# Patient Record
Sex: Male | Born: 1960 | Race: Black or African American | Hispanic: No | Marital: Married | Smoking: Never smoker
Health system: Southern US, Community
[De-identification: ages and names within clinical notes are randomized; demographics above are authoritative.]

## PROBLEM LIST (undated history)

## (undated) DIAGNOSIS — J302 Other seasonal allergic rhinitis: Secondary | ICD-10-CM

## (undated) DIAGNOSIS — J45909 Unspecified asthma, uncomplicated: Secondary | ICD-10-CM

## (undated) DIAGNOSIS — M2550 Pain in unspecified joint: Secondary | ICD-10-CM

## (undated) DIAGNOSIS — K5900 Constipation, unspecified: Secondary | ICD-10-CM

## (undated) DIAGNOSIS — E785 Hyperlipidemia, unspecified: Secondary | ICD-10-CM

## (undated) DIAGNOSIS — M549 Dorsalgia, unspecified: Secondary | ICD-10-CM

## (undated) DIAGNOSIS — E559 Vitamin D deficiency, unspecified: Secondary | ICD-10-CM

## (undated) DIAGNOSIS — I10 Essential (primary) hypertension: Secondary | ICD-10-CM

## (undated) HISTORY — DX: Vitamin D deficiency, unspecified: E55.9

## (undated) HISTORY — DX: Other seasonal allergic rhinitis: J30.2

## (undated) HISTORY — DX: Constipation, unspecified: K59.00

## (undated) HISTORY — DX: Dorsalgia, unspecified: M54.9

## (undated) HISTORY — PX: INGUINAL HERNIA REPAIR: SUR1180

## (undated) HISTORY — DX: Unspecified asthma, uncomplicated: J45.909

## (undated) HISTORY — PX: PILONIDAL CYST EXCISION: SHX744

## (undated) HISTORY — DX: Essential (primary) hypertension: I10

## (undated) HISTORY — PX: WISDOM TOOTH EXTRACTION: SHX21

## (undated) HISTORY — DX: Pain in unspecified joint: M25.50

---

## 1998-12-28 ENCOUNTER — Encounter: Payer: Self-pay | Admitting: Emergency Medicine

## 1998-12-28 ENCOUNTER — Emergency Department (HOSPITAL_COMMUNITY): Admission: EM | Admit: 1998-12-28 | Discharge: 1998-12-28 | Payer: Self-pay | Admitting: Internal Medicine

## 1999-05-19 ENCOUNTER — Emergency Department (HOSPITAL_COMMUNITY): Admission: EM | Admit: 1999-05-19 | Discharge: 1999-05-19 | Payer: Self-pay | Admitting: Emergency Medicine

## 1999-05-19 ENCOUNTER — Encounter: Payer: Self-pay | Admitting: Emergency Medicine

## 2001-10-25 ENCOUNTER — Emergency Department (HOSPITAL_COMMUNITY): Admission: EM | Admit: 2001-10-25 | Discharge: 2001-10-25 | Payer: Self-pay | Admitting: Emergency Medicine

## 2004-08-02 ENCOUNTER — Emergency Department (HOSPITAL_COMMUNITY): Admission: EM | Admit: 2004-08-02 | Discharge: 2004-08-02 | Payer: Self-pay | Admitting: Emergency Medicine

## 2004-08-12 ENCOUNTER — Encounter: Admission: RE | Admit: 2004-08-12 | Discharge: 2004-10-01 | Payer: Self-pay | Admitting: Internal Medicine

## 2004-10-27 ENCOUNTER — Ambulatory Visit: Payer: Self-pay | Admitting: Internal Medicine

## 2008-03-31 ENCOUNTER — Ambulatory Visit: Payer: Self-pay | Admitting: Internal Medicine

## 2009-04-02 LAB — CONVERTED CEMR LAB: PSA: 0.28 ng/mL

## 2009-05-18 ENCOUNTER — Ambulatory Visit: Payer: Self-pay | Admitting: Internal Medicine

## 2009-05-22 LAB — CONVERTED CEMR LAB
ALT: 24 units/L (ref 0–53)
AST: 26 units/L (ref 0–37)
Albumin: 3.7 g/dL (ref 3.5–5.2)
Alkaline Phosphatase: 50 units/L (ref 39–117)
BUN: 9 mg/dL (ref 6–23)
Basophils Absolute: 0 10*3/uL (ref 0.0–0.1)
Basophils Relative: 0.2 % (ref 0.0–3.0)
Bilirubin, Direct: 0.1 mg/dL (ref 0.0–0.3)
CO2: 29 meq/L (ref 19–32)
Calcium: 8.7 mg/dL (ref 8.4–10.5)
Chloride: 109 meq/L (ref 96–112)
Cholesterol: 208 mg/dL — ABNORMAL HIGH (ref 0–200)
Creatinine, Ser: 0.9 mg/dL (ref 0.4–1.5)
Direct LDL: 148.4 mg/dL
Eosinophils Absolute: 0.1 10*3/uL (ref 0.0–0.7)
Eosinophils Relative: 2.2 % (ref 0.0–5.0)
GFR calc non Af Amer: 116.28 mL/min (ref >60)
Glucose, Bld: 85 mg/dL (ref 70–99)
HCT: 38.8 % — ABNORMAL LOW (ref 39.0–52.0)
HDL: 56.6 mg/dL (ref >39.00)
Hemoglobin: 13.3 g/dL (ref 13.0–17.0)
Lymphocytes Relative: 32.9 % (ref 12.0–46.0)
Lymphs Abs: 1.6 10*3/uL (ref 0.7–4.0)
MCHC: 34.2 g/dL (ref 30.0–36.0)
MCV: 93.3 fL (ref 78.0–100.0)
Monocytes Absolute: 0.5 10*3/uL (ref 0.1–1.0)
Monocytes Relative: 9.5 % (ref 3.0–12.0)
Neutro Abs: 2.6 10*3/uL (ref 1.4–7.7)
Neutrophils Relative %: 55.2 % (ref 43.0–77.0)
Platelets: 162 10*3/uL (ref 150.0–400.0)
Potassium: 4.3 meq/L (ref 3.5–5.1)
RBC: 4.16 M/uL — ABNORMAL LOW (ref 4.22–5.81)
RDW: 13.1 % (ref 11.5–14.6)
Sodium: 144 meq/L (ref 135–145)
TSH: 1.29 microintl units/mL (ref 0.35–5.50)
Total Bilirubin: 0.8 mg/dL (ref 0.3–1.2)
Total CHOL/HDL Ratio: 4
Total Protein: 6.7 g/dL (ref 6.0–8.3)
Triglycerides: 53 mg/dL (ref 0.0–149.0)
VLDL: 10.6 mg/dL (ref 0.0–40.0)
WBC: 4.8 10*3/uL (ref 4.5–10.5)

## 2010-12-19 LAB — CONVERTED CEMR LAB
ALT: 17 units/L (ref 0–53)
AST: 23 units/L (ref 0–37)
Albumin: 3.7 g/dL (ref 3.5–5.2)
Alkaline Phosphatase: 36 units/L — ABNORMAL LOW (ref 39–117)
BUN: 8 mg/dL (ref 6–23)
Basophils Absolute: 0 10*3/uL (ref 0.0–0.1)
Basophils Relative: 0.5 % (ref 0.0–1.0)
Bilirubin Urine: NEGATIVE
Bilirubin, Direct: 0.1 mg/dL (ref 0.0–0.3)
Blood in Urine, dipstick: NEGATIVE
CO2: 30 meq/L (ref 19–32)
Calcium: 8.9 mg/dL (ref 8.4–10.5)
Chloride: 106 meq/L (ref 96–112)
Cholesterol: 206 mg/dL (ref 0–200)
Creatinine, Ser: 1 mg/dL (ref 0.4–1.5)
Direct LDL: 143.3 mg/dL
Eosinophils Absolute: 0.2 10*3/uL (ref 0.0–0.7)
Eosinophils Relative: 3.7 % (ref 0.0–5.0)
GFR calc Af Amer: 104 mL/min
GFR calc non Af Amer: 86 mL/min
Glucose, Bld: 71 mg/dL (ref 70–99)
Glucose, Urine, Semiquant: NEGATIVE
HCT: 41.3 % (ref 39.0–52.0)
HDL: 41.2 mg/dL (ref >39.0)
Hemoglobin: 13.9 g/dL (ref 13.0–17.0)
Ketones, urine, test strip: NEGATIVE
Lymphocytes Relative: 34.3 % (ref 12.0–46.0)
MCHC: 33.7 g/dL (ref 30.0–36.0)
MCV: 93.4 fL (ref 78.0–100.0)
Monocytes Absolute: 0.6 10*3/uL (ref 0.1–1.0)
Monocytes Relative: 9.7 % (ref 3.0–12.0)
Neutro Abs: 3.2 10*3/uL (ref 1.4–7.7)
Neutrophils Relative %: 51.8 % (ref 43.0–77.0)
Nitrite: NEGATIVE
PSA: 0.21 ng/mL (ref 0.10–4.00)
Platelets: 188 10*3/uL (ref 150–400)
Potassium: 3.8 meq/L (ref 3.5–5.1)
Protein, U semiquant: NEGATIVE
RBC: 4.42 M/uL (ref 4.22–5.81)
RDW: 13.4 % (ref 11.5–14.6)
Sodium: 140 meq/L (ref 135–145)
Specific Gravity, Urine: 1.01
TSH: 1.85 microintl units/mL (ref 0.35–5.50)
Total Bilirubin: 0.7 mg/dL (ref 0.3–1.2)
Total CHOL/HDL Ratio: 5
Total Protein: 6.7 g/dL (ref 6.0–8.3)
Triglycerides: 121 mg/dL (ref 0–149)
Urobilinogen, UA: 0.2
VLDL: 24 mg/dL (ref 0–40)
WBC Urine, dipstick: NEGATIVE
WBC: 6.1 10*3/uL (ref 4.5–10.5)
pH: 6

## 2011-06-10 ENCOUNTER — Other Ambulatory Visit: Payer: Self-pay

## 2011-06-17 ENCOUNTER — Encounter: Payer: Self-pay | Admitting: Internal Medicine

## 2011-08-02 ENCOUNTER — Other Ambulatory Visit: Payer: Self-pay

## 2011-08-08 ENCOUNTER — Encounter: Payer: Self-pay | Admitting: Internal Medicine

## 2011-08-09 ENCOUNTER — Encounter: Payer: Self-pay | Admitting: Internal Medicine

## 2011-08-09 DIAGNOSIS — Z0289 Encounter for other administrative examinations: Secondary | ICD-10-CM

## 2011-10-14 ENCOUNTER — Other Ambulatory Visit (INDEPENDENT_AMBULATORY_CARE_PROVIDER_SITE_OTHER): Payer: PRIVATE HEALTH INSURANCE

## 2011-10-14 DIAGNOSIS — Z0000 Encounter for general adult medical examination without abnormal findings: Secondary | ICD-10-CM

## 2011-10-14 LAB — LIPID PANEL
Cholesterol: 217 mg/dL — ABNORMAL HIGH (ref 0–200)
Total CHOL/HDL Ratio: 4
Triglycerides: 142 mg/dL (ref 0.0–149.0)

## 2011-10-14 LAB — HEPATIC FUNCTION PANEL
ALT: 20 U/L (ref 0–53)
Albumin: 3.8 g/dL (ref 3.5–5.2)
Total Bilirubin: 0.5 mg/dL (ref 0.3–1.2)
Total Protein: 6.7 g/dL (ref 6.0–8.3)

## 2011-10-14 LAB — BASIC METABOLIC PANEL
BUN: 10 mg/dL (ref 6–23)
CO2: 28 mEq/L (ref 19–32)
Calcium: 9.1 mg/dL (ref 8.4–10.5)
Chloride: 107 mEq/L (ref 96–112)
Creatinine, Ser: 1.1 mg/dL (ref 0.4–1.5)
GFR: 88.52 mL/min (ref >60.00)
Glucose, Bld: 98 mg/dL (ref 70–99)
Potassium: 4.4 mEq/L (ref 3.5–5.1)
Sodium: 142 mEq/L (ref 135–145)

## 2011-10-14 LAB — CBC WITH DIFFERENTIAL/PLATELET
Eosinophils Relative: 4.8 % (ref 0.0–5.0)
HCT: 41.7 % (ref 39.0–52.0)
Hemoglobin: 13.9 g/dL (ref 13.0–17.0)
Lymphs Abs: 2.3 10*3/uL (ref 0.7–4.0)
MCV: 93.9 fl (ref 78.0–100.0)
Monocytes Absolute: 0.6 10*3/uL (ref 0.1–1.0)
Neutro Abs: 3 10*3/uL (ref 1.4–7.7)
Platelets: 192 10*3/uL (ref 150.0–400.0)
WBC: 6.2 10*3/uL (ref 4.5–10.5)

## 2011-10-14 LAB — TSH: TSH: 3.37 u[IU]/mL (ref 0.35–5.50)

## 2011-10-14 LAB — POCT URINALYSIS DIPSTICK
Bilirubin, UA: NEGATIVE
Blood, UA: NEGATIVE
Glucose, UA: NEGATIVE
Ketones, UA: NEGATIVE
Spec Grav, UA: >=1.03
pH, UA: 5

## 2011-10-14 LAB — LDL CHOLESTEROL, DIRECT: Direct LDL: 145.5 mg/dL

## 2011-10-24 ENCOUNTER — Encounter: Payer: Self-pay | Admitting: Internal Medicine

## 2011-12-09 ENCOUNTER — Ambulatory Visit (INDEPENDENT_AMBULATORY_CARE_PROVIDER_SITE_OTHER): Payer: PRIVATE HEALTH INSURANCE | Admitting: Internal Medicine

## 2011-12-09 ENCOUNTER — Encounter: Payer: Self-pay | Admitting: Internal Medicine

## 2011-12-09 VITALS — BP 118/80 | HR 67 | Temp 98.4°F | Ht 74.0 in | Wt 293.0 lb

## 2011-12-09 DIAGNOSIS — Z0000 Encounter for general adult medical examination without abnormal findings: Secondary | ICD-10-CM

## 2011-12-09 NOTE — Progress Notes (Signed)
Patient ID: Edward Mccormick, male   DOB: [birth year], 51 y.o.   MRN: [id number] cpx  No past medical history on file.  History   Social History  . Marital Status: Married    Spouse Name: N/A    Number of Children: N/A  . Years of Education: N/A   Occupational History  . Not on file.   Social History Main Topics  . Smoking status: Never Smoker   . Smokeless tobacco: Not on file  . Alcohol Use: No  . Drug Use: No  . Sexually Active:    Other Topics Concern  . Not on file   Social History Narrative  . No narrative on file    Past Surgical History  Procedure Date  . Inguinal hernia repair   . Pilonidal cyst excision   . Wisdom tooth extraction     No family history on file.  No Known Allergies  No current outpatient prescriptions on file prior to visit.     patient denies chest pain, shortness of breath, orthopnea. Denies lower extremity edema, abdominal pain, change in appetite, change in bowel movements. Patient denies rashes, musculoskeletal complaints. No other specific complaints in a complete review of systems.   BP 118/80  Pulse 67  Temp(Src) 98.4 F (36.9 C) (Oral)  Ht 6\' 2"  (1.88 m)  Wt 293 lb (132.904 kg)  BMI 37.62 kg/m2 Well-developed male in no acute distress. HEENT exam atraumatic, normocephalic, extraocular muscles are intact. Conjunctivae are pink without exudate. Neck is supple without lymphadenopathy, thyromegaly, jugular venous distention. Chest is clear to auscultation without increased work of breathing. Cardiac exam S1-S2 are regular. The PMI is normal. No significant murmurs or gallops. Abdominal exam active bowel sounds, soft, nontender. No abdominal bruits. Extremities no clubbing cyanosis or edema. Peripheral pulses are normal without bruits. Neurologic exam alert and oriented without any motor or sensory deficits.  A/P: well visit--health maint UTD

## 2012-03-02 ENCOUNTER — Telehealth: Payer: Self-pay | Admitting: Internal Medicine

## 2012-03-02 DIAGNOSIS — Z0000 Encounter for general adult medical examination without abnormal findings: Secondary | ICD-10-CM

## 2012-03-02 NOTE — Telephone Encounter (Signed)
Pt called and said that he is turning 51 next month and since there is family hx of prostate issues, pt it wondering if it is recommended for him to get a colonoscopy? Pls call.

## 2012-03-02 NOTE — Telephone Encounter (Signed)
Scheduled a PSA on 04/25/12 and referred for a colonoscopy for next month

## 2012-03-05 ENCOUNTER — Encounter: Payer: Self-pay | Admitting: Internal Medicine

## 2012-04-05 ENCOUNTER — Ambulatory Visit (AMBULATORY_SURGERY_CENTER): Payer: PRIVATE HEALTH INSURANCE | Admitting: *Deleted

## 2012-04-05 VITALS — Ht 74.0 in | Wt 294.5 lb

## 2012-04-05 DIAGNOSIS — Z1211 Encounter for screening for malignant neoplasm of colon: Secondary | ICD-10-CM

## 2012-04-05 MED ORDER — PEG-KCL-NACL-NASULF-NA ASC-C 100 G PO SOLR: ORAL | Status: DC

## 2012-04-19 ENCOUNTER — Encounter: Payer: Self-pay | Admitting: Internal Medicine

## 2012-04-19 ENCOUNTER — Ambulatory Visit (AMBULATORY_SURGERY_CENTER): Payer: PRIVATE HEALTH INSURANCE | Admitting: Internal Medicine

## 2012-04-19 VITALS — HR 63 | Temp 96.0°F | Resp 18 | Ht 74.0 in | Wt 294.0 lb

## 2012-04-19 DIAGNOSIS — D126 Benign neoplasm of colon, unspecified: Secondary | ICD-10-CM

## 2012-04-19 DIAGNOSIS — K621 Rectal polyp: Secondary | ICD-10-CM

## 2012-04-19 DIAGNOSIS — K620 Anal polyp: Secondary | ICD-10-CM

## 2012-04-19 DIAGNOSIS — Z1211 Encounter for screening for malignant neoplasm of colon: Secondary | ICD-10-CM

## 2012-04-19 HISTORY — PX: COLONOSCOPY: SHX174

## 2012-04-19 MED ORDER — SODIUM CHLORIDE 0.9 % IV SOLN: 500.0000 mL | INTRAVENOUS | Status: DC

## 2012-04-19 NOTE — Op Note (Signed)
[facility] [address] [place], [place]  [address]  COLONOSCOPY PROCEDURE REPORT  PATIENT:  Edward Mccormick, Edward Mccormick  MR#:  [id number] BIRTHDATE:  Jun 24, 1961, 51 yrs. old  GENDER:  male ENDOSCOPIST:  Iva Boop, MD, [facility] REF. BY:  Birdie Sons, M.D. PROCEDURE DATE:  04/19/2012 PROCEDURE:  Colonoscopy with biopsy and snare polypectomy ASA CLASS:  Class II INDICATIONS:  Routine Risk Screening MEDICATIONS:   These medications were titrated to patient response per physician's verbal order, Fentanyl 50 mcg IV, Versed 5 mg IV  DESCRIPTION OF PROCEDURE:   After the risks benefits and alternatives of the procedure were thoroughly explained, informed consent was obtained.  Digital rectal exam was performed and revealed no abnormalities and normal prostate.   The LB [id number] endoscope was introduced through the anus and advanced to the cecum, which was identified by both the appendix and ileocecal valve, without limitations.  The quality of the prep was excellent, using MoviPrep.  The instrument was then slowly withdrawn as the colon was fully examined. <<PROCEDUREIMAGES>>  FINDINGS:  Two polyps were found in the rectum and sigmoid colon. Diminutive, sigmoid polyp removed with cold biopsy and rectal polyp removed snare polypectomy.  Moderate diverticulosis was found in the sigmoid colon.  Nodular mucosa was found in the mid transverse colon. 1-2 mm nodular changes, subtle and flat, ? polyps vs lymphoid hyperplasia (favor) Multiple biopsies were obtained and sent to pathology.  This was otherwise a normal examination of the colon.   Retroflexed views in the rectum revealed no abnormalities.    The time to cecum = 1:35 minutes. The scope was then withdrawn in 12:58 minutes from the cecum and the procedure completed. COMPLICATIONS:  None ENDOSCOPIC IMPRESSION: 1) Two diminutive polyps in the rectum and sigmoid colon - removed 2) Moderate diverticulosis in the sigmoid  colon 3) Nodular mucosa in the mid transverse colon 4) Otherwise normal examination, excellent prep  REPEAT EXAM:  In for Colonoscopy, pending biopsy results.  Iva Boop, MD, [facility]  CC:  Lindley Magnus, MD and The Patient  n. eSIGNED:   Iva Boop at 04/19/2012 11:47 AM  Elio Forget, [phone]

## 2012-04-19 NOTE — Patient Instructions (Signed)
YOU HAD AN ENDOSCOPIC PROCEDURE TODAY AT THE [facility]: Refer to the procedure report that was given to you for any specific questions about what was found during the examination.  If the procedure report does not answer your questions, please call your gastroenterologist to clarify.  If you requested that your care partner not be given the details of your procedure findings, then the procedure report has been included in a sealed envelope for you to review at your convenience later.  YOU SHOULD EXPECT: Some feelings of bloating in the abdomen. Passage of more gas than usual.  Walking can help get rid of the air that was put into your GI tract during the procedure and reduce the bloating. If you had a lower endoscopy (such as a colonoscopy or flexible sigmoidoscopy) you may notice spotting of blood in your stool or on the toilet paper. If you underwent a bowel prep for your procedure, then you may not have a normal bowel movement for a few days.  DIET: Your first meal following the procedure should be a light meal and then it is ok to progress to your normal diet.  A half-sandwich or bowl of soup is an example of a good first meal.  Heavy or fried foods are harder to digest and may make you feel nauseous or bloated.  Likewise meals heavy in dairy and vegetables can cause extra gas to form and this can also increase the bloating.  Drink plenty of fluids but you should avoid alcoholic beverages for 24 hours.  ACTIVITY: Your care partner should take you home directly after the procedure.  You should plan to take it easy, moving slowly for the rest of the day.  You can resume normal activity the day after the procedure however you should NOT DRIVE or use heavy machinery for 24 hours (because of the sedation medicines used during the test).    SYMPTOMS TO REPORT IMMEDIATELY: A gastroenterologist can be reached at any hour.  During normal business hours, 8:30 AM to 5:00 PM Monday through Friday,  call [phone].  After hours and on weekends, please call the GI answering service at [phone] who will take a message and have the physician on call contact you.   Following lower endoscopy (colonoscopy or flexible sigmoidoscopy):  Excessive amounts of blood in the stool  Significant tenderness or worsening of abdominal pains  Swelling of the abdomen that is new, acute  Fever of 100F or higher   FOLLOW UP: If any biopsies were taken you will be contacted by phone or by letter within the next 1-3 weeks.  Call your gastroenterologist if you have not heard about the biopsies in 3 weeks.  Our staff will call the home number listed on your records the next business day following your procedure to check on you and address any questions or concerns that you may have at that time regarding the information given to you following your procedure. This is a courtesy call and so if there is no answer at the home number and we have not heard from you through the emergency physician on call, we will assume that you have returned to your regular daily activities without incident.  SIGNATURES/CONFIDENTIALITY: You and/or your care partner have signed paperwork which will be entered into your electronic medical record.  These signatures attest to the fact that that the information above on your After Visit Summary has been reviewed and is understood.  Full responsibility of the confidentiality of   this discharge information lies with you and/or your care-partner.   Resume medications. Information given on polyps,diverticulosis and high fiber diet with discharge instructions. 

## 2012-04-19 NOTE — Progress Notes (Signed)
Patient did not experience any of the following events: a burn prior to discharge; a fall within the facility; wrong site/side/patient/procedure/implant event; or a hospital transfer or hospital admission upon discharge from the facility. (G8907) Patient did not have preoperative order for IV antibiotic SSI prophylaxis. (G8918)  

## 2012-04-20 ENCOUNTER — Telehealth: Payer: Self-pay | Admitting: *Deleted

## 2012-04-20 NOTE — Telephone Encounter (Signed)
Left message

## 2012-04-23 ENCOUNTER — Other Ambulatory Visit: Payer: Self-pay | Admitting: Internal Medicine

## 2012-04-23 DIAGNOSIS — Z0000 Encounter for general adult medical examination without abnormal findings: Secondary | ICD-10-CM

## 2012-04-24 ENCOUNTER — Encounter: Payer: Self-pay | Admitting: Internal Medicine

## 2012-04-24 NOTE — Progress Notes (Signed)
Quick Note:  Hyperplastic polyps and lymphoid follicles Repeat colonoscopy about 2023 ______

## 2012-04-25 ENCOUNTER — Other Ambulatory Visit: Payer: PRIVATE HEALTH INSURANCE

## 2012-08-02 ENCOUNTER — Telehealth: Payer: Self-pay | Admitting: Internal Medicine

## 2012-08-02 NOTE — Telephone Encounter (Signed)
Pls advise.  

## 2012-08-02 NOTE — Telephone Encounter (Signed)
Patient calling, had an endoscopy and colonscopy at the [facility] office.  Got a letter that all was clear but would like to talk with MD about the results.  No results seen in EPIC.  Also using Androgel pump samples given to him about 3 years ago.  His wife doesn't like the smell and he is asking if he could try the underarm application?  He uses [facility] on [address].

## 2012-08-02 NOTE — Telephone Encounter (Signed)
Patient called stating that he would like a call back to discuss his endoscopy results. Please assist.

## 2012-08-03 NOTE — Telephone Encounter (Signed)
He should call GI for results-- he was probably given results at time of endoscopy Can d/c androgel and try axiron-- 1 application to alternating underarms daily

## 2012-08-03 NOTE — Telephone Encounter (Signed)
Left message for pt to call back  °

## 2012-08-06 MED ORDER — TESTOSTERONE 30 MG/ACT TD SOLN: 1.0000 | Freq: Every day | TRANSDERMAL | Status: DC

## 2012-08-06 NOTE — Telephone Encounter (Signed)
Pt aware, rx sent in electronically 

## 2012-08-08 ENCOUNTER — Telehealth: Payer: Self-pay | Admitting: Internal Medicine

## 2012-08-08 NOTE — Telephone Encounter (Signed)
rx called in

## 2012-08-08 NOTE — Telephone Encounter (Signed)
Pt called and said that he is at the [facility] on [address] and was told by pharmacist that there has not been a script sent in for Testosterone (AXIRON) 30 MG/ACT SOLN. Pt is at pharmacy. Pls call in asap.

## 2012-11-28 ENCOUNTER — Telehealth: Payer: Self-pay | Admitting: Internal Medicine

## 2012-11-28 ENCOUNTER — Other Ambulatory Visit: Payer: PRIVATE HEALTH INSURANCE

## 2012-11-28 NOTE — Telephone Encounter (Signed)
Took care of his question without a note/kjh

## 2012-12-05 ENCOUNTER — Encounter: Payer: PRIVATE HEALTH INSURANCE | Admitting: Internal Medicine

## 2012-12-12 ENCOUNTER — Encounter: Payer: PRIVATE HEALTH INSURANCE | Admitting: Internal Medicine

## 2013-01-23 ENCOUNTER — Other Ambulatory Visit (INDEPENDENT_AMBULATORY_CARE_PROVIDER_SITE_OTHER): Payer: BC Managed Care – PPO

## 2013-01-23 LAB — HEPATIC FUNCTION PANEL
AST: 21 U/L (ref 0–37)
Alkaline Phosphatase: 55 U/L (ref 39–117)
Bilirubin, Direct: 0.1 mg/dL (ref 0.0–0.3)

## 2013-01-23 LAB — CBC WITH DIFFERENTIAL/PLATELET
Basophils Relative: 0.4 % (ref 0.0–3.0)
Eosinophils Relative: 3.3 % (ref 0.0–5.0)
MCV: 90.4 fl (ref 78.0–100.0)
Monocytes Absolute: 0.5 10*3/uL (ref 0.1–1.0)
Monocytes Relative: 8.3 % (ref 3.0–12.0)
Neutrophils Relative %: 53.9 % (ref 43.0–77.0)
Platelets: 184 10*3/uL (ref 150.0–400.0)
RBC: 4.52 Mil/uL (ref 4.22–5.81)
WBC: 6.1 10*3/uL (ref 4.5–10.5)

## 2013-01-23 LAB — BASIC METABOLIC PANEL
BUN: 8 mg/dL (ref 6–23)
CO2: 31 mEq/L (ref 19–32)
Chloride: 103 mEq/L (ref 96–112)
Glucose, Bld: 96 mg/dL (ref 70–99)
Potassium: 4.3 mEq/L (ref 3.5–5.1)
Sodium: 138 mEq/L (ref 135–145)

## 2013-01-23 LAB — PSA: PSA: 0.26 ng/mL (ref 0.10–4.00)

## 2013-01-23 LAB — POCT URINALYSIS DIPSTICK
Ketones, UA: NEGATIVE
Leukocytes, UA: NEGATIVE
Protein, UA: NEGATIVE
Urobilinogen, UA: 0.2
pH, UA: 6

## 2013-01-23 LAB — LIPID PANEL
HDL: 50.1 mg/dL (ref >39.00)
LDL Cholesterol: 117 mg/dL — ABNORMAL HIGH (ref 0–99)
Total CHOL/HDL Ratio: 4
VLDL: 21.2 mg/dL (ref 0.0–40.0)

## 2013-01-23 LAB — TSH: TSH: 3.29 u[IU]/mL (ref 0.35–5.50)

## 2013-01-28 NOTE — Progress Notes (Signed)
cpx  Past Medical History  Diagnosis Date  . Seasonal allergies     History   Social History  . Marital Status: Married    Spouse Name: N/A    Number of Children: N/A  . Years of Education: N/A   Occupational History  . Not on file.   Social History Main Topics  . Smoking status: Never Smoker   . Smokeless tobacco: Never Used  . Alcohol Use: No  . Drug Use: No  . Sexually Active: Not on file   Other Topics Concern  . Not on file   Social History Narrative  . No narrative on file    Past Surgical History  Procedure Laterality Date  . Inguinal hernia repair    . Pilonidal cyst excision    . Wisdom tooth extraction      Family History  Problem Relation Age of Onset  . Colon cancer Neg Hx   . Stomach cancer Neg Hx   . Esophageal cancer Neg Hx   . Rectal cancer Neg Hx     No Known Allergies  Current Outpatient Prescriptions on File Prior to Visit  Medication Sig Dispense Refill  . Testosterone (AXIRON) 30 MG/ACT SOLN Place 1 Act onto the skin daily. Alternate arms  90 mL  1   No current facility-administered medications on file prior to visit.     patient denies chest pain, shortness of breath, orthopnea. Denies lower extremity edema, abdominal pain, change in appetite, change in bowel movements. Patient denies rashes, musculoskeletal complaints. No other specific complaints in a complete review of systems.   There were no vitals taken for this visit.  well-developed well-nourished male in no acute distress. HEENT exam atraumatic, normocephalic, neck supple without jugular venous distention. Chest clear to auscultation cardiac exam S1-S2 are regular. Abdominal exam overweight with bowel sounds, soft and nontender. Extremities no edema. Neurologic exam is alert with a normal gait.   Well visit- Health maint UTD He needs to lose weigh t- discussed at length.

## 2013-01-29 ENCOUNTER — Ambulatory Visit (INDEPENDENT_AMBULATORY_CARE_PROVIDER_SITE_OTHER): Payer: BC Managed Care – PPO | Admitting: Internal Medicine

## 2013-01-29 ENCOUNTER — Encounter: Payer: Self-pay | Admitting: Internal Medicine

## 2013-01-29 VITALS — BP 124/92 | HR 68 | Temp 98.3°F | Ht 74.5 in | Wt 300.0 lb

## 2013-07-01 ENCOUNTER — Encounter: Payer: Self-pay | Admitting: Family Medicine

## 2013-07-01 ENCOUNTER — Ambulatory Visit (INDEPENDENT_AMBULATORY_CARE_PROVIDER_SITE_OTHER): Payer: BC Managed Care – PPO | Admitting: Family Medicine

## 2013-07-01 VITALS — BP 122/86 | Temp 99.0°F | Wt 300.0 lb

## 2013-07-01 DIAGNOSIS — W57XXXA Bitten or stung by nonvenomous insect and other nonvenomous arthropods, initial encounter: Secondary | ICD-10-CM

## 2013-07-01 DIAGNOSIS — T148 Other injury of unspecified body region: Secondary | ICD-10-CM

## 2013-07-01 DIAGNOSIS — L309 Dermatitis, unspecified: Secondary | ICD-10-CM

## 2013-07-01 DIAGNOSIS — L259 Unspecified contact dermatitis, unspecified cause: Secondary | ICD-10-CM

## 2013-07-01 DIAGNOSIS — M549 Dorsalgia, unspecified: Secondary | ICD-10-CM

## 2013-07-01 MED ORDER — DOXYCYCLINE HYCLATE 100 MG PO CAPS: 100.0000 mg | ORAL_CAPSULE | Freq: Two times a day (BID) | ORAL | Status: DC

## 2013-07-01 MED ORDER — DOXYCYCLINE HYCLATE 50 MG PO CAPS: 50.0000 mg | ORAL_CAPSULE | Freq: Two times a day (BID) | ORAL | Status: DC

## 2013-07-01 NOTE — Patient Instructions (Addendum)
-  heat 15 minutes twice daily on back  -tylenol or ibuprofen per instructions as needed  -back exercises  -As we discussed, we have prescribed a new medication for you at this appointment. We discussed the common and serious potential adverse effects of this medication and you can review these and more with the pharmacist when you pick up your medication.  Please follow the instructions for use carefully and notify us immediately if you have any problems taking this medication.  -cerave cream on skin  -follow up with your doctor if symptoms persist

## 2013-07-01 NOTE — Progress Notes (Signed)
Chief Complaint  Patient presents with  . Back Pain    sensitve to touch,dull headache, achy and behind the eyes, no appetite.     HPI:  Edward Mccormick is a 52 yo M patient of Dr. Lovell Sheehan here for an acute visit for back pain: -started a few days ago -low back pain started after a lot of yard work last week, bilat -pain is constant, dull, mild-mod -worse with certain movements - better with stretching, tylenol and aleve helps -had headache this weekend, better now, has chronic sinus issues - headache is better -bit by a tick last week, removed immediately -skin a little dry and sensitive, mother rand daughters with eczema  ROS: See pertinent positives and negatives per HPI.  Past Medical History  Diagnosis Date  . Seasonal allergies     Family History  Problem Relation Age of Onset  . Colon cancer Neg Hx   . Stomach cancer Neg Hx   . Esophageal cancer Neg Hx   . Rectal cancer Neg Hx     History   Social History  . Marital Status: Married    Spouse Name: N/A    Number of Children: N/A  . Years of Education: N/A   Social History Main Topics  . Smoking status: Never Smoker   . Smokeless tobacco: Never Used  . Alcohol Use: No  . Drug Use: No  . Sexually Active: None   Other Topics Concern  . None   Social History Narrative  . None    Current outpatient prescriptions:doxycycline (VIBRAMYCIN) 100 MG capsule, Take 1 capsule (100 mg total) by mouth 2 (two) times daily., Disp: 20 capsule, Rfl: 0  EXAM:  Filed Vitals:   07/01/13 1452  BP: 122/86  Temp: 99 F (37.2 C)    Body mass index is 38.01 kg/(m^2).  GENERAL: vitals reviewed and listed above, alert, oriented, appears well hydrated and in no acute distress  HEENT: atraumatic, conjunttiva clear, no obvious abnormalities on inspection of external nose and ears  NECK: no obvious masses on inspection, no meningeal signs  LUNGS: clear to auscultation bilaterally, no wheezes, rales or rhonchi, good air  movement  CV: HRRR, no peripheral edema  SKIN: dry skin throughout  MS: moves all extremities without noticeable abnormality Normal Gait Normal inspection of back, no obvious scoliosis or leg length descrepancy No bony TTP Soft tissue TTP at: L lumbar paraspinal muscles -/+ tests: neg trendelenburg,-facet loading, -SLRT, -CLRT, -FABER, -FADIR Normal muscle strength, sensation to light touch and DTRs in LEs bilaterally PSYCH: pleasant and cooperative, no obvious depression or anxiety  ASSESSMENT AND PLAN:  Discussed the following assessment and plan:  Tick bite - Plan: doxycycline (VIBRAMYCIN) 100 MG capsule, DISCONTINUED: doxycycline (VIBRAMYCIN) 50 MG capsule  Back pain  Eczema  -he is worried about tick borne illness, discussed signs- symptoms, transmission and very unlikely given tick not biting long - but will tx with doxy in case of mild tick borne illness -dry eczematous type skin, advised cerave -supportive care, HEP fore back pain -follow up with PCP in 4 week if symptoms persist -Patient advised to return or notify a doctor immediately if symptoms worsen or persist or new concerns arise.  Patient Instructions  -heat 15 minutes twice daily on back  -tylenol or ibuprofen per instructions as needed  -back exercises  -As we discussed, we have prescribed a new medication for you at this appointment. We discussed the common and serious potential adverse effects of this medication and you  can review these and more with the pharmacist when you pick up your medication.  Please follow the instructions for use carefully and notify us immediately if you have any problems taking this medication.  -cerave cream on skin  -follow up with your doctor if symptoms persist     KIM, HANNAH R.

## 2016-03-21 ENCOUNTER — Encounter: Payer: Self-pay | Admitting: Family

## 2016-03-21 ENCOUNTER — Ambulatory Visit (INDEPENDENT_AMBULATORY_CARE_PROVIDER_SITE_OTHER): Payer: PRIVATE HEALTH INSURANCE | Admitting: Family

## 2016-03-21 VITALS — BP 108/82 | HR 105 | Temp 99.1°F | Resp 14 | Ht 74.5 in | Wt 272.4 lb

## 2016-03-21 DIAGNOSIS — R05 Cough: Secondary | ICD-10-CM

## 2016-03-21 DIAGNOSIS — R059 Cough, unspecified: Secondary | ICD-10-CM

## 2016-03-21 MED ORDER — LEVOFLOXACIN 500 MG PO TABS: 500.0000 mg | ORAL_TABLET | Freq: Every day | ORAL | Status: DC

## 2016-03-21 MED ORDER — HYDROCOD POLST-CPM POLST ER 10-8 MG/5ML PO SUER: 5.0000 mL | Freq: Every evening | ORAL | Status: DC | PRN

## 2016-03-21 NOTE — Progress Notes (Signed)
Pre visit review using our clinic review tool, if applicable. No additional management support is needed unless otherwise documented below in the visit note. 

## 2016-03-21 NOTE — Patient Instructions (Signed)
Thank you for choosing [facility].  Summary/Instructions:  Your prescription(s) have been submitted to your pharmacy or been printed and provided for you. Please take as directed and contact our office if you believe you are having problem(s) with the medication(s) or have any questions.  If your symptoms worsen or fail to improve, please contact our office for further instruction, or in case of emergency go directly to the emergency room at the closest medical facility.   General Recommendations:    Please drink plenty of fluids.  Get plenty of rest   Sleep in humidified air  Use saline nasal sprays  Netti pot   OTC Medications:  Decongestants - helps relieve congestion   Flonase (generic fluticasone) or Nasacort (generic triamcinolone) - please make sure to use the "cross-over" technique at a 45 degree angle towards the opposite eye as opposed to straight up the nasal passageway.   Sudafed (generic pseudoephedrine - Note this is the one that is available behind the pharmacy counter); Products with phenylephrine (-PE) may also be used but is often not as effective as pseudoephedrine.   If you have HIGH BLOOD PRESSURE - Coricidin HBP; AVOID any product that is -D as this contains pseudoephedrine which may increase your blood pressure.  Afrin (oxymetazoline) every 6-8 hours for up to 3 days.   Allergies - helps relieve runny nose, itchy eyes and sneezing   Claritin (generic loratidine), Allegra (fexofenidine), or Zyrtec (generic cyrterizine) for runny nose. These medications should not cause drowsiness.  Note - Benadryl (generic diphenhydramine) may be used however may cause drowsiness  Cough -   Delsym or Robitussin (generic dextromethorphan)  Expectorants - helps loosen mucus to ease removal   Mucinex (generic guaifenesin) as directed on the package.  Headaches / General Aches   Tylenol (generic acetaminophen) - DO NOT EXCEED 3 grams (3,000 mg) in a 24  hour time period  Advil/Motrin (generic ibuprofen)   Sore Throat -   Salt water gargle   Chloraseptic (generic benzocaine) spray or lozenges / Sucrets (generic dyclonine)      

## 2016-03-21 NOTE — Assessment & Plan Note (Signed)
Symptoms and exam consistent with bacterial bronchitis. Start levofloxacin. Start Tussionex as needed for cough and sleep. Continue over-the-counter medications as needed for symptom relief and supportive care. Follow-up if symptoms worsen or do not improve.

## 2016-03-21 NOTE — Progress Notes (Signed)
Subjective:    Patient ID: Edward Mccormick, male    DOB: 12/10/1960, 55 y.o.   MRN: [id number]  Chief Complaint  Patient presents with  . Cough    x12 days has had a productive cough, sore throat, congestion, also concerned about BP    HPI:  Edward Mccormick is a 55 y.o. male who  has a past medical history of Seasonal allergies. and presents today For an acute office visit.  This is a new problem. Associated symptoms of productive cough, sore throat, and congestion that has been going on for approximately 12 days. Endorses a low grade fever on and off. Modifying factors include Mucinex, salt water gargles, tea/honey, Nyquil, Theraflu which have helped a little with his symptoms. Course of the symptoms has slightly improved since initial onset but he continues to cough. Describes his energy level remains low. No recent antibiotics.    No Known Allergies   No current outpatient prescriptions on file prior to visit.   No current facility-administered medications on file prior to visit.    Review of Systems  Constitutional: Positive for fever. Negative for chills.  HENT: Positive for congestion, sinus pressure and sore throat. Negative for ear pain.   Respiratory: Positive for cough. Negative for chest tightness and shortness of breath.   Neurological: Positive for headaches.      Objective:    BP 108/82 mmHg  Pulse 105  Temp(Src) 99.1 F (37.3 C) (Oral)  Resp 14  Ht 6' 2.5" (1.892 m)  Wt 272 lb 6.4 oz (123.56 kg)  BMI 34.52 kg/m2  SpO2 98% Nursing note and vital signs reviewed.  Physical Exam  Constitutional: He is oriented to person, place, and time. He appears well-developed and well-nourished. No distress.  HENT:  Right Ear: Hearing, tympanic membrane, external ear and ear canal normal.  Left Ear: Hearing, tympanic membrane, external ear and ear canal normal.  Nose: Nose normal.  Mouth/Throat: Uvula is midline, oropharynx is clear and moist and mucous membranes  are normal.  Cardiovascular: Normal rate, regular rhythm, normal heart sounds and intact distal pulses.   Pulmonary/Chest: Effort normal and breath sounds normal.  Neurological: He is alert and oriented to person, place, and time.  Skin: Skin is warm and dry.  Psychiatric: He has a normal mood and affect. His behavior is normal. Judgment and thought content normal.       Assessment & Plan:   Problem List Items Addressed This Visit      Other   Cough - Primary    Symptoms and exam consistent with bacterial bronchitis. Start levofloxacin. Start Tussionex as needed for cough and sleep. Continue over-the-counter medications as needed for symptom relief and supportive care. Follow-up if symptoms worsen or do not improve.      Relevant Medications   levofloxacin (LEVAQUIN) 500 MG tablet   chlorpheniramine-HYDROcodone (TUSSIONEX PENNKINETIC ER) 10-8 MG/5ML SUER       I have discontinued Mr. Dittus doxycycline. I am also having him start on levofloxacin and chlorpheniramine-HYDROcodone.   Meds ordered this encounter  Medications  . levofloxacin (LEVAQUIN) 500 MG tablet    Sig: Take 1 tablet (500 mg total) by mouth daily.    Dispense:  7 tablet    Refill:  0    Order Specific Question:  Supervising Provider    Answer:  Pricilla Holm A [id number]  . chlorpheniramine-HYDROcodone (TUSSIONEX PENNKINETIC ER) 10-8 MG/5ML SUER    Sig: Take 5 mLs by mouth at bedtime as needed.  Dispense:  115 mL    Refill:  0    Order Specific Question:  Supervising Provider    Answer:  Pricilla Holm A [id number]     Follow-up: Return if symptoms worsen or fail to improve.  Mauricio Po, FNP

## 2016-03-22 ENCOUNTER — Telehealth: Payer: Self-pay | Admitting: Internal Medicine

## 2016-03-22 NOTE — Telephone Encounter (Signed)
yes

## 2016-03-22 NOTE — Telephone Encounter (Signed)
Patient is requesting to establish with you. He was a former patient of dr swords, but is styill within our 3 year rule. Are you ok with accepting him as a patient?

## 2016-03-23 NOTE — Telephone Encounter (Signed)
Please notify.

## 2016-03-28 NOTE — Telephone Encounter (Signed)
Confirmed with PCP pt unable to take pt on at this time

## 2016-12-22 ENCOUNTER — Ambulatory Visit (INDEPENDENT_AMBULATORY_CARE_PROVIDER_SITE_OTHER): Payer: PRIVATE HEALTH INSURANCE | Admitting: Family Medicine

## 2016-12-22 ENCOUNTER — Encounter: Payer: Self-pay | Admitting: Family Medicine

## 2016-12-22 VITALS — BP 134/98 | HR 63 | Temp 98.2°F | Ht 75.0 in | Wt 294.6 lb

## 2016-12-22 DIAGNOSIS — R635 Abnormal weight gain: Secondary | ICD-10-CM | POA: Diagnosis not present

## 2016-12-22 DIAGNOSIS — I10 Essential (primary) hypertension: Secondary | ICD-10-CM | POA: Insufficient documentation

## 2016-12-22 DIAGNOSIS — R030 Elevated blood-pressure reading, without diagnosis of hypertension: Secondary | ICD-10-CM

## 2016-12-22 LAB — COMPREHENSIVE METABOLIC PANEL
ALT: 73 U/L — ABNORMAL HIGH (ref 0–53)
AST: 23 U/L (ref 0–37)
Albumin: 4.1 g/dL (ref 3.5–5.2)
Alkaline Phosphatase: 41 U/L (ref 39–117)
BUN: 14 mg/dL (ref 6–23)
CO2: 29 mEq/L (ref 19–32)
Calcium: 9 mg/dL (ref 8.4–10.5)
Chloride: 108 mEq/L (ref 96–112)
Creatinine, Ser: 1.08 mg/dL (ref 0.40–1.50)
GFR: 91.4 mL/min (ref >60.00)
Glucose, Bld: 93 mg/dL (ref 70–99)
Potassium: 4.6 mEq/L (ref 3.5–5.1)
Sodium: 140 mEq/L (ref 135–145)
Total Bilirubin: 0.5 mg/dL (ref 0.2–1.2)
Total Protein: 7 g/dL (ref 6.0–8.3)

## 2016-12-22 LAB — LIPID PANEL
Cholesterol: 207 mg/dL — ABNORMAL HIGH (ref 0–200)
HDL: 52.9 mg/dL (ref >39.00)
LDL Cholesterol: 139 mg/dL — ABNORMAL HIGH (ref 0–99)
NonHDL: 154.14
Total CHOL/HDL Ratio: 4
Triglycerides: 75 mg/dL (ref 0.0–149.0)
VLDL: 15 mg/dL (ref 0.0–40.0)

## 2016-12-22 MED ORDER — BLOOD PRESSURE MONITOR AUTOMAT DEVI
0 refills | Status: DC
Start: 2016-12-22 — End: 2017-07-14

## 2016-12-22 NOTE — Patient Instructions (Signed)
It was nice to meet you today!   Work on losing those 10 pounds over the next 3 months.   We will check your labs today.

## 2016-12-22 NOTE — Progress Notes (Signed)
Pre visit review using our clinic review tool, if applicable. No additional management support is needed unless otherwise documented below in the visit note. 

## 2016-12-22 NOTE — Progress Notes (Signed)
Edward Mccormick is a 56 y.o. male is here to [place].   History of Present Illness:    1. Elevated blood-pressure reading without diagnosis of hypertension: Checked BP at [facility]. 153/98. In office, 134/98. Patient states that he is usually 120s-130s/80s. No tobacco, ETOH, drugs. No NSAIDs. No supplements. No snoring or concern for sleep apnea. FamHx: Mom and Sister with HTN. Denies Cp, SOB, He, dizziness, cough, LE edema.   2. Weight gain: Over the holidays, enjoyed his wife and mother's cooking too much. He estimates that he has gained about 10 pounds. No dedicated exercise, but her hunts and works outside.       PMHx, SurgHx, SocialHx, Medications, and Allergies were reviewed in the Visit Navigator and updated as appropriate.    Past Medical History:  Diagnosis Date  . Seasonal allergies     Past Surgical History:  Procedure Laterality Date  . INGUINAL HERNIA REPAIR    . PILONIDAL CYST EXCISION    . WISDOM TOOTH EXTRACTION      Family History  Problem Relation Age of Onset  . Colon cancer Neg Hx   . Stomach cancer Neg Hx   . Esophageal cancer Neg Hx   . Rectal cancer Neg Hx     Social History  Substance Use Topics  . Smoking status: Never Smoker  . Smokeless tobacco: Never Used  . Alcohol use No     Current Medications and Allergies:    Current Outpatient Prescriptions:  .  Blood Pressure Monitoring (BLOOD PRESSURE MONITOR AUTOMAT) DEVI, TO CHECK BLOOD PRESSURE TWICE DAILY AS DIRECTED, Disp: 1 Device, Rfl: 0   No Known Allergies    Patient Information Form: Screening and ROS    Do you feel safe in relationships? yes PHQ-2: negative  Review of Systems  General:  Negative for nexplained weight loss, fever Skin: Negative for new or changing mole, sore that won't heal HEENT: Negative for trouble hearing, trouble seeing, ringing in ears, mouth sores, hoarseness, change in voice, dysphagia CV:  Negative for chest pain, dyspnea, edema,  palpitations Resp: Negative for cough, dyspnea, hemoptysis GI: Negative for nausea, vomiting, diarrhea, constipation, abdominal pain, melena, hematochezia GU: Negative for dysuria, incontinence, urinary hesitance, hematuria, vaginal or penile discharge, polyuria, sexual difficulty, lumps in testicle or breasts MSK: Negative for muscle cramps or aches, joint pain or swelling Neuro: Negative for headaches, weakness, numbness, dizziness, passing out/fainting Psych: Negative for depression, anxiety, memory problems   Vitals:   Vitals:   12/22/16 1104  BP: (!) 134/98  Pulse: 63  Temp: 98.2 F (36.8 C)  TempSrc: Oral  SpO2: 99%  Weight: 294 lb 9.6 oz (133.6 kg)  Height: 6\' 3"  (1.905 m)     Body mass index is 36.82 kg/m.   Physical Exam:    General: Alert, cooperative, appears stated age and no distress.  HEENT:  Normocephalic, without obvious abnormality, atraumatic. Conjunctivae/corneas clear. PERRL, EOM's intact. Normal TM's and external ear canals both ears. Nares normal. Septum midline. Mucosa normal. No drainage or sinus tenderness. Lips, mucosa, and tongue normal; teeth and gums normal.  Lungs: Clear to auscultation bilaterally.  Heart:: Regular rate and rhythm, S1, S2 normal, no murmur, click, rub or gallop.  Abdomen: Soft, non-tender; bowel sounds normal; no masses,  no organomegaly.  Extremities: Extremities normal, atraumatic, no cyanosis or edema.  Pulses: 2+ and symmetric.  Skin: Skin color, texture, turgor normal. No rashes or lesions.  Neurologic: Alert and oriented X 3, normal strength and tone. Normal  symmetric. reflexes. Normal coordination and gait.  Psych: Alert,oriented, in NAD with a full range of affect, normal behavior and no psychotic features      Assessment and Plan:    Edward Mccormick was seen today for hypertension and establish care.  Diagnoses and all orders for this visit:  Elevated blood-pressure reading without diagnosis of  hypertension Comments: Patient will check BP BID. Keep a log. Follow up in 3 months to review. Red flags discussed for coming in sooner.  Orders: -     Blood Pressure Monitoring (BLOOD PRESSURE MONITOR AUTOMAT) DEVI; TO CHECK BLOOD PRESSURE TWICE DAILY AS DIRECTED -     Comprehensive metabolic panel -     Lipid panel  Weight gain Comments: 10 pound weight gain over the holidays. We reviewed healthy weight loss methods.   . Reviewed expectations re: course of current medical issues. . Discussed self-management of symptoms. . Outlined signs and symptoms indicating need for more acute intervention. . Patient verbalized understanding and all questions were answered. . See orders for this visit as documented in the electronic medical record. . Patient received an After Visit Summary.  Records requested if needed. I spent 30 minutes with this patient, greater than 50% was face-to-face time counseling regarding the above diagnoses.    Briscoe Deutscher, [place] [place] 12/22/2016   Follow-up: Return in about 3 months (around 03/21/2017).  Meds ordered this encounter  Medications  . Blood Pressure Monitoring (BLOOD PRESSURE MONITOR AUTOMAT) DEVI    Sig: TO CHECK BLOOD PRESSURE TWICE DAILY AS DIRECTED    Dispense:  1 Device    Refill:  0   Medications Discontinued During This Encounter  Medication Reason  . chlorpheniramine-HYDROcodone (TUSSIONEX PENNKINETIC ER) 10-8 MG/5ML SUER Error  . levofloxacin (LEVAQUIN) 500 MG tablet Error   Orders Placed This Encounter  Procedures  . Comprehensive metabolic panel  . Lipid panel

## 2017-01-03 ENCOUNTER — Telehealth: Payer: Self-pay | Admitting: Family Medicine

## 2017-01-03 NOTE — Telephone Encounter (Signed)
Called and spoke with pt informing him of his results and recommendations. Nothing further needed at this time.

## 2017-01-03 NOTE — Telephone Encounter (Signed)
Missed a call regarding blood work.  Thanksyou,  -LL

## 2017-03-22 ENCOUNTER — Encounter: Payer: Self-pay | Admitting: Family Medicine

## 2017-03-22 ENCOUNTER — Telehealth: Payer: Self-pay | Admitting: Family Medicine

## 2017-03-22 ENCOUNTER — Ambulatory Visit (INDEPENDENT_AMBULATORY_CARE_PROVIDER_SITE_OTHER): Payer: PRIVATE HEALTH INSURANCE | Admitting: Family Medicine

## 2017-03-22 VITALS — BP 132/90 | HR 63 | Temp 98.0°F | Ht 75.0 in | Wt 285.8 lb

## 2017-03-22 DIAGNOSIS — E669 Obesity, unspecified: Secondary | ICD-10-CM | POA: Diagnosis not present

## 2017-03-22 DIAGNOSIS — R030 Elevated blood-pressure reading, without diagnosis of hypertension: Secondary | ICD-10-CM

## 2017-03-22 DIAGNOSIS — I10 Essential (primary) hypertension: Secondary | ICD-10-CM | POA: Diagnosis not present

## 2017-03-22 DIAGNOSIS — E7800 Pure hypercholesterolemia, unspecified: Secondary | ICD-10-CM | POA: Diagnosis not present

## 2017-03-22 MED ORDER — LISINOPRIL 10 MG PO TABS: 10.0000 mg | ORAL_TABLET | Freq: Every day | ORAL | 3 refills | Status: DC

## 2017-03-22 NOTE — Progress Notes (Signed)
Edward Mccormick is a 56 y.o. male is here for follow up.  History of Present Illness:   [name] CMA acting as scribe for Dr. Juleen China.  Hypertension  This is a new problem. The current episode started more than 1 month ago. The problem is unchanged. The problem is controlled. Associated symptoms include headaches. There are no associated agents to hypertension. There are no known risk factors for coronary artery disease. Past treatments include nothing. There are no compliance problems.    There are no preventive care reminders to display for this patient.  PMHx, SurgHx, SocialHx, FamHx, Medications, and Allergies were reviewed in the Visit Navigator and updated as appropriate.   Patient Active Problem List   Diagnosis Date Noted  . Obesity (BMI 30.0-34.9) 03/22/2017  . Pure hypercholesterolemia 03/22/2017  . Elevated blood-pressure reading without diagnosis of hypertension 12/22/2016   Social History  Substance Use Topics  . Smoking status: Never Smoker  . Smokeless tobacco: Never Used  . Alcohol use No   Current Medications and Allergies:   .  Blood Pressure Monitoring (BLOOD PRESSURE MONITOR AUTOMAT) DEVI  No Known Allergies   Review of Systems   Review of Systems  Constitutional: Negative for chills and fever.  HENT: Negative for congestion, ear pain, sinus pain and sore throat.   Eyes: Negative for double vision.  Respiratory: Negative for cough and wheezing.   Cardiovascular: Negative for leg swelling.  Gastrointestinal: Negative for abdominal pain, constipation, diarrhea, heartburn and vomiting.  Genitourinary: Negative for dysuria.  Musculoskeletal: Negative for back pain and joint pain.  Neurological: Positive for headaches. Negative for dizziness.       Headache that started today.   Psychiatric/Behavioral: Negative for depression, hallucinations and memory loss.    Vitals:   Vitals:   03/22/17 1002  BP: 132/90  Pulse: 63  Temp: 98 F (36.7 C)    TempSrc: Oral  SpO2: 99%  Weight: 285 lb 12.8 oz (129.6 kg)  Height: 6\' 3"  (1.905 m)     Body mass index is 35.72 kg/m.   Physical Exam:    Physical Exam  Constitutional: He is oriented to person, place, and time. He appears well-developed and well-nourished. No distress.  HENT:  Head: Normocephalic and atraumatic.  Right Ear: External ear normal.  Left Ear: External ear normal.  Nose: Nose normal.  Mouth/Throat: Oropharynx is clear and moist.  Eyes: Conjunctivae and EOM are normal. Pupils are equal, round, and reactive to light.  Neck: Normal range of motion. Neck supple.  Cardiovascular: Normal rate, regular rhythm, normal heart sounds and intact distal pulses.   Pulmonary/Chest: Effort normal and breath sounds normal.  Abdominal: Soft. Bowel sounds are normal.  Musculoskeletal: Normal range of motion.  Neurological: He is alert and oriented to person, place, and time.  Skin: Skin is warm and dry.  Psychiatric: He has a normal mood and affect. His behavior is normal. Judgment and thought content normal.  Nursing note and vitals reviewed.    Results for orders placed or performed in visit on 12/22/16  Comprehensive metabolic panel  Result Value Ref Range   Sodium 140 135 - 145 mEq/L   Potassium 4.6 3.5 - 5.1 mEq/L   Chloride 108 96 - 112 mEq/L   CO2 29 19 - 32 mEq/L   Glucose, Bld 93 70 - 99 mg/dL   BUN 14 6 - 23 mg/dL   Creatinine, Ser 1.08 0.40 - 1.50 mg/dL   Total Bilirubin 0.5 0.2 - 1.2 mg/dL  Alkaline Phosphatase 41 39 - 117 U/L   AST 23 0 - 37 U/L   ALT 73 (H) 0 - 53 U/L   Total Protein 7.0 6.0 - 8.3 g/dL   Albumin 4.1 3.5 - 5.2 g/dL   Calcium 9.0 8.4 - 10.5 mg/dL   GFR 91.40 >60.00 mL/min  Lipid panel  Result Value Ref Range   Cholesterol 207 (H) 0 - 200 mg/dL   Triglycerides 75.0 0.0 - 149.0 mg/dL   HDL 52.90 >39.00 mg/dL   VLDL 15.0 0.0 - 40.0 mg/dL   LDL Cholesterol 139 (H) 0 - 99 mg/dL   Total CHOL/HDL Ratio 4    NonHDL 154.14    Assessment  and Plan:    Loghan was seen today for hypertension.  Diagnoses and all orders for this visit:  Essential hypertension Comments:   Orders: -     lisinopril (PRINIVIL,ZESTRIL) 10 MG tablet; Take 1 tablet (10 mg total) by mouth daily.  Obesity (BMI 30.0-34.9) Comments: The patient is asked to make an attempt to improve diet and exercise patterns to aid in medical management of this problem.   Pure hypercholesterolemia Comments: We discussed the pros and cons of lipid lowering medication based on current numbers and potential for cardiovascular risk. Decided to hold off on using medication at this time. Patient to work on diet and exercise. Offered nutrition counseling. Pt declines at this time.   . Reviewed expectations re: course of current medical issues. . Discussed self-management of symptoms. . Outlined signs and symptoms indicating need for more acute intervention. . Patient verbalized understanding and all questions were answered. . See orders for this visit as documented in the electronic medical record. . Patient received an After Visit Summary.   CMA served as Education administrator during this visit. History, Physical, and Plan performed by medical provider. Documentation and orders reviewed and attested to. Briscoe Deutscher, D.O.  Briscoe Deutscher, [place], [place] 03/22/2017  Follow-up: No Follow-up on file.  Future Appointments Date Time Provider [place]  05/31/2017 9:45 AM Briscoe Deutscher, DO [facility] None

## 2017-03-22 NOTE — Telephone Encounter (Signed)
Patient called in- I emailed him a link to dr Talbert Nan @ [place] per the conversation in his visit  Additionally, he states that he is going to [employer] and is concerned about a hep a outbreak. He is asking if he should get the injection. Verified that his mobile is the best means of contact

## 2017-03-23 NOTE — Telephone Encounter (Signed)
Please advise 

## 2017-03-24 ENCOUNTER — Telehealth: Payer: Self-pay | Admitting: Family Medicine

## 2017-03-24 NOTE — Telephone Encounter (Signed)
Patient will hold off on titer at this time.  He is leaving in a few days.

## 2017-03-24 NOTE — Telephone Encounter (Signed)
Patient states his urologist told him he doesn't have low testosterone.  States Dr. Juleen China was going to start medication for weight loss based on testosterone results.  He would like to start weight loss medication now.  Please advise.

## 2017-03-24 NOTE — Telephone Encounter (Signed)
Patient called to advise that he does want to follow through with the medication that Dr. Juleen China was going to prescribe to him at his last appointment. Patient stated that it was in reference to his testosterone levels. Please call patient to further advise on mobile phone. Okay to leave a detailed message.

## 2017-03-24 NOTE — Telephone Encounter (Signed)
Needs titers.

## 2017-03-26 NOTE — Telephone Encounter (Signed)
Have patient come in for a weight check and bmp in 2 weeks. If stable on lisinopril, will start medication.

## 2017-03-27 NOTE — Telephone Encounter (Signed)
Left message for patient to return call.

## 2017-03-27 NOTE — Telephone Encounter (Signed)
Spoke with patient and scheduled appointment.

## 2017-04-14 ENCOUNTER — Telehealth: Payer: Self-pay

## 2017-04-14 DIAGNOSIS — Z79899 Other long term (current) drug therapy: Secondary | ICD-10-CM

## 2017-04-14 NOTE — Telephone Encounter (Signed)
Pt coming for labs 04/19/17. Please place future order/s. Thank you.

## 2017-04-18 NOTE — Addendum Note (Signed)
Addended by: Frutoso Chase A on: 04/18/2017 09:31 AM   Modules accepted: Orders

## 2017-04-18 NOTE — Telephone Encounter (Signed)
Order has been placed.

## 2017-04-19 ENCOUNTER — Other Ambulatory Visit (INDEPENDENT_AMBULATORY_CARE_PROVIDER_SITE_OTHER): Payer: PRIVATE HEALTH INSURANCE

## 2017-04-19 ENCOUNTER — Telehealth: Payer: Self-pay

## 2017-04-19 ENCOUNTER — Ambulatory Visit (INDEPENDENT_AMBULATORY_CARE_PROVIDER_SITE_OTHER): Payer: PRIVATE HEALTH INSURANCE | Admitting: Family Medicine

## 2017-04-19 VITALS — BP 130/100 | HR 72 | Wt 288.4 lb

## 2017-04-19 DIAGNOSIS — Z79899 Other long term (current) drug therapy: Secondary | ICD-10-CM

## 2017-04-19 DIAGNOSIS — R030 Elevated blood-pressure reading, without diagnosis of hypertension: Secondary | ICD-10-CM | POA: Diagnosis not present

## 2017-04-19 DIAGNOSIS — E669 Obesity, unspecified: Secondary | ICD-10-CM | POA: Diagnosis not present

## 2017-04-19 LAB — COMPREHENSIVE METABOLIC PANEL
ALT: 19 U/L (ref 0–53)
AST: 21 U/L (ref 0–37)
Albumin: 4 g/dL (ref 3.5–5.2)
Alkaline Phosphatase: 46 U/L (ref 39–117)
BUN: 11 mg/dL (ref 6–23)
CO2: 29 mEq/L (ref 19–32)
Calcium: 9.1 mg/dL (ref 8.4–10.5)
Chloride: 106 mEq/L (ref 96–112)
Creatinine, Ser: 1 mg/dL (ref 0.40–1.50)
GFR: 99.77 mL/min (ref >60.00)
Glucose, Bld: 95 mg/dL (ref 70–99)
Potassium: 3.7 mEq/L (ref 3.5–5.1)
Sodium: 139 mEq/L (ref 135–145)
Total Bilirubin: 0.6 mg/dL (ref 0.2–1.2)
Total Protein: 6.9 g/dL (ref 6.0–8.3)

## 2017-04-19 NOTE — Telephone Encounter (Signed)
Pt came to the office for a weight check today. He did advise that he d/c taking his blood pressure medication (Lisinopril) approximately one week after starting due to a constant dry cough. Started medication 03/23/2017. Cough started 3-4 after---D/C approx 03/30/2017. Denies any chest pains, SOB. Blood pressure today was 130/100. He was advised that we will call him with recommendations.

## 2017-04-19 NOTE — Progress Notes (Signed)
Weight checked on patient today. Due to pt stopping blood pressure medication, I did check BP today as well. Will send a phone note to Dr. Juleen China.

## 2017-04-23 NOTE — Telephone Encounter (Signed)
The next step would be an ARB (similar med without the side-effect of cough). Let me know if he wants to start it.

## 2017-04-24 NOTE — Telephone Encounter (Signed)
Pt is aware of annotations below. He is agreeable to start new BP medication and okay to send into local [place]. Also, pt was asking about starting a weight loss medication? I told him I would let you know and will call him back after you advised.

## 2017-04-30 MED ORDER — LOSARTAN POTASSIUM 50 MG PO TABS: 50.0000 mg | ORAL_TABLET | Freq: Every day | ORAL | 1 refills | Status: DC

## 2017-04-30 NOTE — Addendum Note (Signed)
Addended by: Briscoe Deutscher on: 04/30/2017 08:19 PM   Modules accepted: Orders

## 2017-04-30 NOTE — Telephone Encounter (Signed)
Cozaar sent to [facility]. Try 1/2 tab for first week to make sure he tolerates and okay to increase if not meeting BP goal. Follow up with me in 2-3 weeks to recheck BP and discuss weight loss med.

## 2017-05-02 NOTE — Telephone Encounter (Signed)
MyChart message sent for patient.

## 2017-05-31 ENCOUNTER — Ambulatory Visit (INDEPENDENT_AMBULATORY_CARE_PROVIDER_SITE_OTHER): Payer: PRIVATE HEALTH INSURANCE | Admitting: Family Medicine

## 2017-05-31 ENCOUNTER — Encounter: Payer: Self-pay | Admitting: Family Medicine

## 2017-05-31 VITALS — BP 130/94 | HR 64 | Temp 98.1°F | Ht 75.0 in | Wt 287.0 lb

## 2017-05-31 DIAGNOSIS — I10 Essential (primary) hypertension: Secondary | ICD-10-CM

## 2017-05-31 DIAGNOSIS — E669 Obesity, unspecified: Secondary | ICD-10-CM | POA: Diagnosis not present

## 2017-05-31 NOTE — Progress Notes (Signed)
Edward Mccormick is a 56 y.o. male is here for follow up.  History of Present Illness:   Edward Mccormick CMA acting as scribe for Dr. Juleen China.  Hypertension  This is a chronic problem. The current episode started more than 1 year ago. The problem is unchanged. The problem is uncontrolled. Pertinent negatives include no chest pain, headaches, malaise/fatigue, palpitations or shortness of breath. (None.) There are no associated agents to hypertension. Risk factors for coronary artery disease include obesity and family history. Past treatments include nothing. The current treatment provides no improvement. Compliance problems include diet.    Obesity Usual eating pattern includes: The patient does not eat regular meals due to a stressful job. Usual physical activity includes biking a few days per week, recently started. Current life stressors: none.  Wt Readings from Last 3 Encounters:  05/31/17 287 lb (130.2 kg)  04/19/17 288 lb 6.4 oz (130.8 kg)  03/22/17 285 lb 12.8 oz (129.6 kg)    PMHx, SurgHx, SocialHx, FamHx, Medications, and Allergies were reviewed in the Visit Navigator and updated as appropriate.   Patient Active Problem List   Diagnosis Date Noted  . Obesity (BMI 30.0-34.9) 03/22/2017  . Pure hypercholesterolemia 03/22/2017  . Hypertension 12/22/2016   Social History  Substance Use Topics  . Smoking status: Never Smoker  . Smokeless tobacco: Never Used  . Alcohol use No   Current Medications and Allergies:   .  losartan (COZAAR) 50 MG tablet, Take 1 tablet (50 mg total) by mouth daily., Disp: 30 tablet, Rfl: 1 - HAS NOT STARTED YET  No Known Allergies   Review of Systems   Review of Systems  Constitutional: Negative for chills, fever, malaise/fatigue and weight loss.  Respiratory: Negative for cough, shortness of breath and wheezing.   Cardiovascular: Negative for chest pain, palpitations and leg swelling.  Gastrointestinal: Negative for abdominal pain,  constipation, diarrhea, nausea and vomiting.  Genitourinary: Negative for dysuria and urgency.  Musculoskeletal: Negative for joint pain and myalgias.  Skin: Negative for rash.  Neurological: Negative for dizziness and headaches.  Psychiatric/Behavioral: Negative for depression, substance abuse and suicidal ideas. The patient is not nervous/anxious.    Vitals:   Vitals:   05/31/17 0953  BP: (!) 130/94  Pulse: 64  Temp: 98.1 F (36.7 C)  TempSrc: Oral  SpO2: 99%  Weight: 287 lb (130.2 kg)  Height: 6\' 3"  (1.905 m)     Body mass index is 35.87 kg/m.  Physical Exam:   Physical Exam  Constitutional: He is oriented to person, place, and time. He appears well-developed and well-nourished. No distress.  HENT:  Head: Normocephalic and atraumatic.  Right Ear: External ear normal.  Left Ear: External ear normal.  Nose: Nose normal.  Mouth/Throat: Oropharynx is clear and moist.  Eyes: Pupils are equal, round, and reactive to light. Conjunctivae and EOM are normal.  Neck: Normal range of motion. Neck supple.  Cardiovascular: Normal rate, regular rhythm, normal heart sounds and intact distal pulses.   Pulmonary/Chest: Effort normal and breath sounds normal.  Abdominal: Soft. Bowel sounds are normal.  Musculoskeletal: Normal range of motion.  Neurological: He is alert and oriented to person, place, and time.  Skin: Skin is warm and dry.  Psychiatric: He has a normal mood and affect. His behavior is normal. Judgment and thought content normal.  Nursing note and vitals reviewed.    Results for orders placed or performed in visit on 04/19/17  Comprehensive metabolic panel  Result Value Ref Range  Sodium 139 135 - 145 mEq/L   Potassium 3.7 3.5 - 5.1 mEq/L   Chloride 106 96 - 112 mEq/L   CO2 29 19 - 32 mEq/L   Glucose, Bld 95 70 - 99 mg/dL   BUN 11 6 - 23 mg/dL   Creatinine, Ser 1.00 0.40 - 1.50 mg/dL   Total Bilirubin 0.6 0.2 - 1.2 mg/dL   Alkaline Phosphatase 46 39 - 117 U/L    AST 21 0 - 37 U/L   ALT 19 0 - 53 U/L   Total Protein 6.9 6.0 - 8.3 g/dL   Albumin 4.0 3.5 - 5.2 g/dL   Calcium 9.1 8.4 - 10.5 mg/dL   GFR 99.77 >60.00 mL/min   Assessment and Plan:   [name] was seen today for follow-up.  Diagnoses and all orders for this visit:  Essential hypertension Comments: Okay to monitor versus start 1/2 tab of Cozaar daily. Precautions reviewed.  Obesity (BMI 30.0-34.9) Comments: The patient is asked to make an attempt to improve diet and exercise patterns to aid in medical management of this problem. We discussed medication for weight loss as well. Specifically, Victoza and low-dose Phentermine (if BP remains controlled). He wants to attempt weight loss on his own at first. Okay to call for medication.   . Reviewed expectations re: course of current medical issues. . Discussed self-management of symptoms. . Outlined signs and symptoms indicating need for more acute intervention. . Patient verbalized understanding and all questions were answered. Marland Kitchen Health Maintenance issues including appropriate healthy diet, exercise, and smoking avoidance were discussed with patient. . See orders for this visit as documented in the electronic medical record. . Patient received an After Visit Summary.  CMA served as Education administrator during this visit. History, Physical, and Plan performed by medical provider. The above documentation has been reviewed and is accurate and complete. Briscoe Deutscher, D.O.  Briscoe Deutscher, DO [place], [place] [facility] 06/03/2017

## 2017-07-13 ENCOUNTER — Encounter: Payer: Self-pay | Admitting: Family Medicine

## 2017-07-14 ENCOUNTER — Other Ambulatory Visit: Payer: Self-pay

## 2017-07-14 DIAGNOSIS — R030 Elevated blood-pressure reading, without diagnosis of hypertension: Secondary | ICD-10-CM

## 2017-07-14 MED ORDER — BLOOD PRESSURE MONITOR AUTOMAT DEVI: 0 refills | Status: DC

## 2017-09-21 ENCOUNTER — Other Ambulatory Visit: Payer: Self-pay | Admitting: Family Medicine

## 2017-11-20 ENCOUNTER — Other Ambulatory Visit: Payer: Self-pay | Admitting: Family Medicine

## 2018-09-03 NOTE — Progress Notes (Signed)
Subjective:    Irbin Fines is a 57 y.o. male who presents today for his Complete Annual Exam.   Current Outpatient Medications:  .  Blood Pressure Monitoring (BLOOD PRESSURE MONITOR AUTOMAT) DEVI, TO CHECK BLOOD PRESSURE TWICE DAILY AS DIRECTED, Disp: 1 Device, Rfl: 0  Health Maintenance Due  Topic Date Due  . Hepatitis C Screening  [birth year]  . INFLUENZA VACCINE  06/21/2018  . TETANUS/TDAP  07/22/2018   PMHx, SurgHx, SocialHx, Medications, and Allergies were reviewed in the Visit Navigator and updated as appropriate.   Past Medical History:  Diagnosis Date  . Seasonal allergies      Past Surgical History:  Procedure Laterality Date  . INGUINAL HERNIA REPAIR    . PILONIDAL CYST EXCISION    . WISDOM TOOTH EXTRACTION       Family History  Problem Relation Age of Onset  . Colon cancer Neg Hx   . Stomach cancer Neg Hx   . Esophageal cancer Neg Hx   . Rectal cancer Neg Hx     Social History   Tobacco Use  . Smoking status: Never Smoker  . Smokeless tobacco: Never Used  Substance Use Topics  . Alcohol use: No  . Drug use: No    Review of Systems:   Pertinent items are noted in the HPI. Otherwise, ROS is negative.  Objective:    Vitals:   09/04/18 1317  BP: 122/84  Pulse: 85  Temp: 98.1 F (36.7 C)  SpO2: 99%   Body mass index is 36.12 kg/m.  General  Alert, cooperative, no distress, appears stated age  Head:  Normocephalic, without obvious abnormality, atraumatic  Eyes:  PERRL, conjunctiva/corneas clear, EOM's intact, fundi benign, both eyes       Ears:  Normal TM's and external ear canals, both ears  Nose: Nares normal, septum midline, mucosa normal, no drainage or sinus tenderness  Throat: Lips, mucosa, and tongue normal; teeth and gums normal  Neck: Supple, symmetrical, trachea midline, no adenopathy; thyroid: no enlargement/tenderness/nodules; no carotid bruit or JVD  Back:   Symmetric, no curvature, ROM normal, no CVA tenderness    Lungs:   Clear to auscultation bilaterally, respirations unlabored  Chest Wall:  No tenderness or deformity  Heart:  Regular rate and rhythm, S1 and S2 normal, no murmur, rub or gallop  Abdomen:   Soft, non-tender, bowel sounds active all four quadrants, no masses, no organomegaly  Extremities: Extremities normal, atraumatic, no cyanosis or edema  Prostate: Not done  Skin: Skin color, texture, turgor normal, no rashes or lesions  Lymph: Cervical, supraclavicular, and axillary nodes normal  Neurologic: CNII-XII grossly intact. Normal strength, sensation and reflexes throughout   AssessmentPlan:   Daxtin was seen today for annual exam.  Diagnoses and all orders for this visit:  Routine physical examination  Elevated blood-pressure reading without diagnosis of hypertension  Obesity (BMI 30.0-34.9) -     CBC with Differential/Platelet -     Comprehensive metabolic panel -     Lipid panel  Screening for colon cancer -     Ambulatory referral to Gastroenterology  Tobacco use -     CT CARDIAC SCORING; Future  Encounter for hepatitis C virus screening test for high risk patient -     Hepatitis C antibody   Patient Counseling: [x]   Nutrition: Stressed importance of moderation in sodium/caffeine intake, saturated fat and cholesterol, caloric balance, sufficient intake of fresh fruits, vegetables, and fiber  [x]   Stressed the importance of regular  exercise.   []   Substance Abuse: Discussed cessation/primary prevention of tobacco, alcohol, or other drug use; driving or other dangerous activities under the influence; availability of treatment for abuse.   [x]   Injury prevention: Discussed safety belts, safety helmets, smoke detector, smoking near bedding or upholstery.   []   Sexuality: Discussed sexually transmitted diseases, partner selection, use of condoms, avoidance of unintended pregnancy  and contraceptive alternatives.   [x]   Dental health: Discussed importance of regular tooth  brushing, flossing, and dental visits.  [x]   Health maintenance and immunizations reviewed. Please refer to Health maintenance section.    Briscoe Deutscher, DO [place]

## 2018-09-04 ENCOUNTER — Ambulatory Visit (INDEPENDENT_AMBULATORY_CARE_PROVIDER_SITE_OTHER): Payer: BC Managed Care – PPO | Admitting: Family Medicine

## 2018-09-04 ENCOUNTER — Encounter: Payer: Self-pay | Admitting: Family Medicine

## 2018-09-04 VITALS — BP 122/84 | HR 85 | Temp 98.1°F | Ht 75.0 in | Wt 289.0 lb

## 2018-09-04 DIAGNOSIS — Z1159 Encounter for screening for other viral diseases: Secondary | ICD-10-CM

## 2018-09-04 DIAGNOSIS — R030 Elevated blood-pressure reading, without diagnosis of hypertension: Secondary | ICD-10-CM

## 2018-09-04 DIAGNOSIS — Z9189 Other specified personal risk factors, not elsewhere classified: Secondary | ICD-10-CM

## 2018-09-04 DIAGNOSIS — Z1211 Encounter for screening for malignant neoplasm of colon: Secondary | ICD-10-CM

## 2018-09-04 DIAGNOSIS — Z0000 Encounter for general adult medical examination without abnormal findings: Secondary | ICD-10-CM | POA: Diagnosis not present

## 2018-09-04 DIAGNOSIS — E669 Obesity, unspecified: Secondary | ICD-10-CM | POA: Diagnosis not present

## 2018-09-04 DIAGNOSIS — Z23 Encounter for immunization: Secondary | ICD-10-CM | POA: Diagnosis not present

## 2018-09-04 DIAGNOSIS — E66811 Obesity, class 1: Secondary | ICD-10-CM

## 2018-09-04 DIAGNOSIS — Z720 Tobacco use: Secondary | ICD-10-CM

## 2018-09-04 LAB — CBC WITH DIFFERENTIAL/PLATELET
Basophils Absolute: 0 10*3/uL (ref 0.0–0.1)
Basophils Relative: 0.6 % (ref 0.0–3.0)
Eosinophils Absolute: 0.1 10*3/uL (ref 0.0–0.7)
Eosinophils Relative: 2.2 % (ref 0.0–5.0)
HCT: 42.2 % (ref 39.0–52.0)
Hemoglobin: 14 g/dL (ref 13.0–17.0)
Lymphocytes Relative: 33.8 % (ref 12.0–46.0)
Lymphs Abs: 1.9 10*3/uL (ref 0.7–4.0)
MCHC: 33.2 g/dL (ref 30.0–36.0)
MCV: 92.1 fl (ref 78.0–100.0)
Monocytes Absolute: 0.5 10*3/uL (ref 0.1–1.0)
Monocytes Relative: 8.3 % (ref 3.0–12.0)
Neutro Abs: 3.1 10*3/uL (ref 1.4–7.7)
Neutrophils Relative %: 55.1 % (ref 43.0–77.0)
Platelets: 182 10*3/uL (ref 150.0–400.0)
RBC: 4.58 Mil/uL (ref 4.22–5.81)
RDW: 14 % (ref 11.5–15.5)
WBC: 5.6 10*3/uL (ref 4.0–10.5)

## 2018-09-04 LAB — COMPREHENSIVE METABOLIC PANEL
ALT: 15 U/L (ref 0–53)
AST: 14 U/L (ref 0–37)
Albumin: 4.1 g/dL (ref 3.5–5.2)
Alkaline Phosphatase: 50 U/L (ref 39–117)
BUN: 11 mg/dL (ref 6–23)
CO2: 32 mEq/L (ref 19–32)
Calcium: 9.1 mg/dL (ref 8.4–10.5)
Chloride: 104 mEq/L (ref 96–112)
Creatinine, Ser: 1.11 mg/dL (ref 0.40–1.50)
GFR: 88 mL/min (ref >60.00)
Glucose, Bld: 94 mg/dL (ref 70–99)
Potassium: 4 mEq/L (ref 3.5–5.1)
Sodium: 140 mEq/L (ref 135–145)
Total Bilirubin: 0.5 mg/dL (ref 0.2–1.2)
Total Protein: 6.9 g/dL (ref 6.0–8.3)

## 2018-09-04 LAB — LIPID PANEL
Cholesterol: 220 mg/dL — ABNORMAL HIGH (ref 0–200)
HDL: 59.7 mg/dL (ref >39.00)
LDL Cholesterol: 135 mg/dL — ABNORMAL HIGH (ref 0–99)
NonHDL: 160.2
Total CHOL/HDL Ratio: 4
Triglycerides: 128 mg/dL (ref 0.0–149.0)
VLDL: 25.6 mg/dL (ref 0.0–40.0)

## 2018-09-04 NOTE — Addendum Note (Signed)
Addended by: Durwin Glaze on: 09/04/2018 02:32 PM   Modules accepted: Orders

## 2018-09-05 LAB — HEPATITIS C ANTIBODY
Hepatitis C Ab: NONREACTIVE
SIGNAL TO CUT-OFF: 0.01 (ref <1.00)

## 2018-09-18 ENCOUNTER — Ambulatory Visit (INDEPENDENT_AMBULATORY_CARE_PROVIDER_SITE_OTHER)
Admission: RE | Admit: 2018-09-18 | Discharge: 2018-09-18 | Disposition: A | Discharge status: Home / Self Care | Payer: Self-pay | Source: Ambulatory Visit | Attending: Family Medicine | Admitting: Family Medicine

## 2018-09-18 DIAGNOSIS — Z720 Tobacco use: Secondary | ICD-10-CM

## 2019-01-21 ENCOUNTER — Telehealth: Payer: Self-pay | Admitting: Family Medicine

## 2019-01-21 NOTE — Telephone Encounter (Signed)
Please clarify with patient and update chart.

## 2019-01-21 NOTE — Telephone Encounter (Signed)
Pt requesting a new script for Losartan which is not on pt's current med list.  LOV on 09/04/18 with Dr. Juleen China.

## 2019-01-21 NOTE — Telephone Encounter (Signed)
Copied from [place] [id number]. Topic: Quick Communication - Rx Refill/Question >> Jan 21, 2019 11:11 AM Robina Ade, Helene Kelp D wrote: Medication: losartan   Has the patient contacted their pharmacy? No, needs new Rx (Agent: If no, request that the patient contact the pharmacy for the refill.) (Agent: If yes, when and what did the pharmacy advise?)  Preferred Pharmacy (with phone number or street name): [facility] [place] - [address]  Agent: Please be advised that RX refills may take up to 3 business days. We ask that you follow-up with your pharmacy.

## 2019-01-21 NOTE — Telephone Encounter (Signed)
Ok to give refill?

## 2019-01-21 NOTE — Telephone Encounter (Signed)
See note

## 2019-01-23 ENCOUNTER — Other Ambulatory Visit: Payer: Self-pay

## 2019-01-23 MED ORDER — LOSARTAN POTASSIUM 50 MG PO TABS: 50.0000 mg | ORAL_TABLET | Freq: Every day | ORAL | 3 refills | Status: DC

## 2019-01-23 NOTE — Telephone Encounter (Signed)
Please see msg and advise.  Not on med list.  Was he on this previously and if so, ok to refill?

## 2019-01-23 NOTE — Telephone Encounter (Signed)
See note

## 2019-01-23 NOTE — Telephone Encounter (Signed)
Called in.

## 2019-01-23 NOTE — Telephone Encounter (Signed)
Okay refill. 

## 2019-01-23 NOTE — Telephone Encounter (Signed)
Pt called to check on RX. He notes he is on Losartan 50mg . He is out since 01/21/2019.  Pt requesting 90 day supply. Previously ordered by Dr. Juleen China. He said that he d/c on his own for a while but back on it.   [facility] ([address]), [place] [phone] (Phone) [phone] (Fax)

## 2019-03-27 ENCOUNTER — Other Ambulatory Visit: Payer: Self-pay

## 2019-03-27 ENCOUNTER — Encounter: Payer: Self-pay | Admitting: Podiatry

## 2019-03-27 ENCOUNTER — Ambulatory Visit (INDEPENDENT_AMBULATORY_CARE_PROVIDER_SITE_OTHER): Payer: BC Managed Care – PPO

## 2019-03-27 ENCOUNTER — Ambulatory Visit: Payer: BC Managed Care – PPO | Admitting: Podiatry

## 2019-03-27 VITALS — Temp 98.1°F

## 2019-03-27 DIAGNOSIS — B351 Tinea unguium: Secondary | ICD-10-CM | POA: Diagnosis not present

## 2019-03-27 DIAGNOSIS — M2011 Hallux valgus (acquired), right foot: Secondary | ICD-10-CM | POA: Diagnosis not present

## 2019-03-27 DIAGNOSIS — M779 Enthesopathy, unspecified: Secondary | ICD-10-CM | POA: Diagnosis not present

## 2019-03-27 DIAGNOSIS — M2012 Hallux valgus (acquired), left foot: Secondary | ICD-10-CM

## 2019-03-27 MED ORDER — TRIAMCINOLONE ACETONIDE 10 MG/ML IJ SUSP
10.0000 mg | Freq: Once | INTRAMUSCULAR | Status: AC
  Administered 2019-03-27: 10 mg

## 2019-03-27 NOTE — Patient Instructions (Signed)
Bunion  A bunion is a bump on the base of the big toe that forms when the bones of the big toe joint move out of position. Bunions may be small at first, but they often get larger over time. They can make walking painful. What are the causes? A bunion may be caused by:  Wearing narrow or pointed shoes that force the big toe to press against the other toes.  Abnormal foot development that causes the foot to roll inward (pronate).  Changes in the foot that are caused by certain diseases, such as rheumatoid arthritis or polio.  A foot injury. What increases the risk? The following factors may make you more likely to develop this condition:  Wearing shoes that squeeze the toes together.  Having certain diseases, such as: ? Rheumatoid arthritis. ? Polio. ? Cerebral palsy.  Having family members who have bunions.  Being born with a foot deformity, such as flat feet or low arches.  Doing activities that put a lot of pressure on the feet, such as ballet dancing. What are the signs or symptoms? The main symptom of a bunion is a noticeable bump on the big toe. Other symptoms may include:  Pain.  Swelling around the big toe.  Redness and inflammation.  Thick or hardened skin on the big toe or between the toes.  Stiffness or loss of motion in the big toe.  Trouble with walking. How is this diagnosed? A bunion may be diagnosed based on your symptoms, medical history, and activities. You may have tests, such as:  X-rays. These allow your health care provider to check the position of the bones in your foot and look for damage to your joint. They also help your health care provider determine the severity of your bunion and the best way to treat it.  Joint aspiration. In this test, a sample of fluid is removed from the toe joint. This test may be done if you are in a lot of pain. It helps rule out diseases that cause painful swelling of the joints, such as arthritis. How is this  treated? Treatment depends on the severity of your symptoms. The goal of treatment is to relieve symptoms and prevent the bunion from getting worse. Your health care provider may recommend:  Wearing shoes that have a wide toe box.  Using bunion pads to cushion the affected area.  Taping your toes together to keep them in a normal position.  Placing a device inside your shoe (orthotics) to help reduce pressure on your toe joint.  Taking medicine to ease pain, inflammation, and swelling.  Applying heat or ice to the affected area.  Doing stretching exercises.  Surgery to remove scar tissue and move the toes back into their normal position. This treatment is rare. Follow these instructions at home: Managing pain, stiffness, and swelling   If directed, put ice on the painful area: ? Put ice in a plastic bag. ? Place a towel between your skin and the bag. ? Leave the ice on for 20 minutes, 2-3 times a day. Activity   If directed, apply heat to the affected area before you exercise. Use the heat source that your health care provider recommends, such as a moist heat pack or a heating pad. ? Place a towel between your skin and the heat source. ? Leave the heat on for 20-30 minutes. ? Remove the heat if your skin turns bright red. This is especially important if you are unable to feel pain,   heat, or cold. You may have a greater risk of getting burned.  Do exercises as told by your health care provider. General instructions  Support your toe joint with proper footwear, shoe padding, or taping as told by your health care provider.  Take over-the-counter and prescription medicines only as told by your health care provider.  Keep all follow-up visits as told by your health care provider. This is important. Contact a health care provider if your symptoms:  Get worse.  Do not improve in 2 weeks. Get help right away if you have:  Severe pain and trouble with walking. Summary  A  bunion is a bump on the base of the big toe that forms when the bones of the big toe joint move out of position.  Bunions can make walking painful.  Treatment depends on the severity of your symptoms.  Support your toe joint with proper footwear, shoe padding, or taping as told by your health care provider. This information is not intended to replace advice given to you by your health care provider. Make sure you discuss any questions you have with your health care provider. Document Released: 11/07/2005 Document Revised: 03/20/2018 Document Reviewed: 03/20/2018 Elsevier Interactive Patient Education  2019 Elsevier Inc.  

## 2019-03-27 NOTE — Progress Notes (Signed)
   Subjective:    Patient ID: Edward Mccormick, male    DOB: [birth year], 58 y.o.   MRN: [id number]  HPI    Review of Systems  All other systems reviewed and are negative.      Objective:   Physical Exam        Assessment & Plan:

## 2019-03-27 NOTE — Progress Notes (Signed)
Subjective:   Patient ID: Edward Mccormick, male   DOB: 58 y.o.   MRN: [id number]   HPI Patient presents with painful bunion deformity left over right and nail disease 1-5 both feet that he was concerned about with discoloration.  Knows at one point he needs to get this bunion fixed but it has become more inflamed recently.  Patient does not smoke and likes to be active   Review of Systems  All other systems reviewed and are negative.       Objective:  Physical Exam Vitals signs and nursing note reviewed.  Constitutional:      Appearance: He is well-developed.  Pulmonary:     Effort: Pulmonary effort is normal.  Musculoskeletal: Normal range of motion.  Skin:    General: Skin is warm.  Neurological:     Mental Status: He is alert.     Neurovascular status intact with muscle strength adequate range of motion within normal limits.  Patient is found to have prominence around the first metatarsal head left with inflammation fluid around the joint surface and no indication of muscle or joint instability.  Patient does have some discoloration of all nails but it is localized with no proximal pathology noted.  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  X-rays indicate that there is structural bunion deformity left over right with mild inflammation around the joint surface

## 2019-04-17 ENCOUNTER — Ambulatory Visit: Payer: BC Managed Care – PPO | Admitting: Podiatry

## 2019-07-22 ENCOUNTER — Encounter: Payer: Self-pay | Admitting: Family Medicine

## 2019-07-23 NOTE — Telephone Encounter (Signed)
Pt on nurse schedule for flu shot.

## 2019-07-24 ENCOUNTER — Ambulatory Visit (INDEPENDENT_AMBULATORY_CARE_PROVIDER_SITE_OTHER): Payer: BC Managed Care – PPO

## 2019-07-24 ENCOUNTER — Other Ambulatory Visit: Payer: Self-pay

## 2019-07-24 ENCOUNTER — Encounter: Payer: Self-pay | Admitting: Family Medicine

## 2019-07-24 DIAGNOSIS — Z23 Encounter for immunization: Secondary | ICD-10-CM | POA: Diagnosis not present

## 2019-07-24 NOTE — Patient Instructions (Signed)
Health Maintenance Due  Topic Date Due  . HIV Screening  04/14/1976  . INFLUENZA VACCINE  06/22/2019    Depression screen PHQ 2/9 09/04/2018  Decreased Interest 0  Down, Depressed, Hopeless 0  PHQ - 2 Score 0

## 2019-09-11 ENCOUNTER — Ambulatory Visit (INDEPENDENT_AMBULATORY_CARE_PROVIDER_SITE_OTHER): Payer: BC Managed Care – PPO

## 2019-09-11 ENCOUNTER — Other Ambulatory Visit: Payer: Self-pay

## 2019-09-11 ENCOUNTER — Ambulatory Visit (INDEPENDENT_AMBULATORY_CARE_PROVIDER_SITE_OTHER): Payer: BC Managed Care – PPO | Admitting: Family Medicine

## 2019-09-11 ENCOUNTER — Encounter: Payer: Self-pay | Admitting: Family Medicine

## 2019-09-11 VITALS — BP 120/82 | HR 63 | Temp 97.9°F | Ht 75.0 in | Wt 288.0 lb

## 2019-09-11 DIAGNOSIS — M792 Neuralgia and neuritis, unspecified: Secondary | ICD-10-CM

## 2019-09-11 DIAGNOSIS — E669 Obesity, unspecified: Secondary | ICD-10-CM | POA: Diagnosis not present

## 2019-09-11 DIAGNOSIS — E7800 Pure hypercholesterolemia, unspecified: Secondary | ICD-10-CM

## 2019-09-11 DIAGNOSIS — I10 Essential (primary) hypertension: Secondary | ICD-10-CM

## 2019-09-11 DIAGNOSIS — E559 Vitamin D deficiency, unspecified: Secondary | ICD-10-CM

## 2019-09-11 DIAGNOSIS — Z0000 Encounter for general adult medical examination without abnormal findings: Secondary | ICD-10-CM | POA: Diagnosis not present

## 2019-09-11 LAB — COMPREHENSIVE METABOLIC PANEL
ALT: 19 U/L (ref 0–53)
AST: 19 U/L (ref 0–37)
Albumin: 4 g/dL (ref 3.5–5.2)
Alkaline Phosphatase: 55 U/L (ref 39–117)
BUN: 11 mg/dL (ref 6–23)
CO2: 29 mEq/L (ref 19–32)
Calcium: 8.7 mg/dL (ref 8.4–10.5)
Chloride: 106 mEq/L (ref 96–112)
Creatinine, Ser: 1.17 mg/dL (ref 0.40–1.50)
GFR: 77.64 mL/min (ref >60.00)
Glucose, Bld: 107 mg/dL — ABNORMAL HIGH (ref 70–99)
Potassium: 3.9 mEq/L (ref 3.5–5.1)
Sodium: 140 mEq/L (ref 135–145)
Total Bilirubin: 0.4 mg/dL (ref 0.2–1.2)
Total Protein: 6.4 g/dL (ref 6.0–8.3)

## 2019-09-11 LAB — LIPID PANEL
Cholesterol: 190 mg/dL (ref 0–200)
HDL: 49.8 mg/dL (ref >39.00)
LDL Cholesterol: 125 mg/dL — ABNORMAL HIGH (ref 0–99)
NonHDL: 140.64
Total CHOL/HDL Ratio: 4
Triglycerides: 80 mg/dL (ref 0.0–149.0)
VLDL: 16 mg/dL (ref 0.0–40.0)

## 2019-09-11 LAB — VITAMIN D 25 HYDROXY (VIT D DEFICIENCY, FRACTURES): VITD: 12.9 ng/mL — ABNORMAL LOW (ref 30.00–100.00)

## 2019-09-11 NOTE — Patient Instructions (Addendum)
[facility] [phone]  

## 2019-09-11 NOTE — Progress Notes (Signed)
Subjective:    Edward Mccormick is a 58 y.o. male who presents today for his Complete Annual Exam.  No current outpatient medications on file.  Health Maintenance Due  Topic Date Due  . HIV Screening  [birth year]1977   PMHx, SurgHx, SocialHx, Medications, and Allergies were reviewed in the Visit Navigator and updated as appropriate.   Past Medical History:  Diagnosis Date  . Seasonal allergies      Past Surgical History:  Procedure Laterality Date  . INGUINAL HERNIA REPAIR    . PILONIDAL CYST EXCISION    . WISDOM TOOTH EXTRACTION       Family History  Problem Relation Age of Onset  . Colon cancer Neg Hx   . Stomach cancer Neg Hx   . Esophageal cancer Neg Hx   . Rectal cancer Neg Hx     Social History   Tobacco Use  . Smoking status: Never Smoker  . Smokeless tobacco: Never Used  Substance Use Topics  . Alcohol use: No  . Drug use: No   Review of Systems:   Pertinent items are noted in the HPI. Otherwise, ROS is negative.  Objective:    Vitals:   09/11/19 0912  BP: 120/82  Pulse: 63  Temp: 97.9 F (36.6 C)  SpO2: 98%   Body mass index is 36 kg/m.  General  Alert, cooperative, no distress, appears stated age  Head:  Normocephalic, without obvious abnormality, atraumatic  Eyes:  PERRL, conjunctiva/corneas clear, EOM's intact, fundi benign, both eyes       Ears:  Normal TM's and external ear canals, both ears  Nose: Nares normal, septum midline, mucosa normal, no drainage or sinus tenderness  Throat: Lips, mucosa, and tongue normal; teeth and gums normal  Neck: Supple, symmetrical, trachea midline, no adenopathy; thyroid: no enlargement/tenderness/nodules; no carotid bruit or JVD  Back:   Symmetric, no curvature, ROM normal, no CVA tenderness  Lungs:   Clear to auscultation bilaterally, respirations unlabored  Chest Wall:  No tenderness or deformity  Heart:  Regular rate and rhythm, S1 and S2 normal, no murmur, rub or gallop  Abdomen:   Soft,  non-tender, bowel sounds active all four quadrants, no masses, no organomegaly  Extremities: Extremities normal, atraumatic, no cyanosis or edema  Prostate: Not done  Skin: Skin color, texture, turgor normal, no rashes or lesions  Lymph: Cervical, supraclavicular, and axillary nodes normal  Neurologic: CNII-XII grossly intact. Normal strength, sensation and reflexes throughout. Pain with right rotation/side bend neck with endorsed pain down right arm.   AssessmentPlan:   Edward Mccormick was seen today for annual exam.  Diagnoses and all orders for this visit:  Routine physical examination  Essential hypertension Comments: Controlled off medication. Orders: -     Comprehensive metabolic panel  Pure hypercholesterolemia -     Comprehensive metabolic panel -     Lipid panel  Obesity (BMI 30.0-34.9)  Vitamin D deficiency -     VITAMIN D 25 Hydroxy (Vit-D Deficiency, Fractures)  Radicular pain of right upper extremity -     DG Cervical Spine 2 or 3 views; Future   Patient Counseling: [x]   Nutrition: Stressed importance of moderation in sodium/caffeine intake, saturated fat and cholesterol, caloric balance, sufficient intake of fresh fruits, vegetables, and fiber  [x]   Stressed the importance of regular exercise.   []   Substance Abuse: Discussed cessation/primary prevention of tobacco, alcohol, or other drug use; driving or other dangerous activities under the influence; availability of treatment for abuse.   [  x]  Injury prevention: Discussed safety belts, safety helmets, smoke detector, smoking near bedding or upholstery.   []   Sexuality: Discussed sexually transmitted diseases, partner selection, use of condoms, avoidance of unintended pregnancy  and contraceptive alternatives.   [x]   Dental health: Discussed importance of regular tooth brushing, flossing, and dental visits.  [x]   Health maintenance and immunizations reviewed. Please refer to Health maintenance section.   Edward Deutscher,  DO [place]

## 2019-09-13 MED ORDER — CHOLECALCIFEROL 1.25 MG (50000 UT) PO TABS: ORAL_TABLET | ORAL | 0 refills | Status: DC

## 2019-09-13 NOTE — Addendum Note (Signed)
Addended by: Briscoe Deutscher R on: 09/13/2019 01:40 PM   Modules accepted: Orders

## 2019-09-24 ENCOUNTER — Other Ambulatory Visit: Payer: Self-pay

## 2019-09-25 ENCOUNTER — Ambulatory Visit: Payer: BC Managed Care – PPO | Admitting: Family Medicine

## 2019-09-25 ENCOUNTER — Encounter: Payer: Self-pay | Admitting: Family Medicine

## 2019-09-25 VITALS — BP 134/80 | HR 94 | Ht 75.0 in | Wt 287.5 lb

## 2019-09-25 DIAGNOSIS — E559 Vitamin D deficiency, unspecified: Secondary | ICD-10-CM

## 2019-09-25 DIAGNOSIS — M5030 Other cervical disc degeneration, unspecified cervical region: Secondary | ICD-10-CM | POA: Diagnosis not present

## 2019-09-25 DIAGNOSIS — I10 Essential (primary) hypertension: Secondary | ICD-10-CM | POA: Diagnosis not present

## 2019-09-25 NOTE — Patient Instructions (Signed)
[facility]

## 2019-09-25 NOTE — Addendum Note (Signed)
Addended by: Ronnald Nian on: 09/25/2019 03:01 PM   Modules accepted: Orders

## 2019-09-25 NOTE — Progress Notes (Signed)
Edward Mccormick is a 58 y.o. male  Chief Complaint  Patient presents with  . Establish Care    HPI: Edward Mccormick is a 58 y.o. male here to transfer his care from Dr. Juleen China at [facility]. Pt is married, 3 grown daughters. Pt is a Theme park manager. Pt would like to see Dr. Zigmund Daniel on days when I am not in the office.   Specialists: urology (Dr. Amalia Hailey)  Last CPE, labs: 08/2019 Last colonoscopy: 03/2012 - due in 2023 (Dr. Carlean Purl with [facility])  Med refills needed today: none  Past Medical History:  Diagnosis Date  . Seasonal allergies     Past Surgical History:  Procedure Laterality Date  . INGUINAL HERNIA REPAIR    . PILONIDAL CYST EXCISION    . WISDOM TOOTH EXTRACTION      Social History   Socioeconomic History  . Marital status: Married    Spouse name: Not on file  . Number of children: Not on file  . Years of education: Not on file  . Highest education level: Not on file  Occupational History    Employer: [employer]  Social Needs  . Financial resource strain: Not on file  . Food insecurity    Worry: Not on file    Inability: Not on file  . Transportation needs    Medical: Not on file    Non-medical: Not on file  Tobacco Use  . Smoking status: Never Smoker  . Smokeless tobacco: Never Used  Substance and Sexual Activity  . Alcohol use: No  . Drug use: No  . Sexual activity: Yes    Partners: Female    Birth control/protection: None  Lifestyle  . Physical activity    Days per week: Not on file    Minutes per session: Not on file  . Stress: Not on file  Relationships  . Social Herbalist on phone: Not on file    Gets together: Not on file    Attends religious service: Not on file    Active member of club or organization: Not on file    Attends meetings of clubs or organizations: Not on file    Relationship status: Not on file  . Intimate partner violence    Fear of current or ex partner: Not on file    Emotionally abused: Not on file     Physically abused: Not on file    Forced sexual activity: Not on file  Other Topics Concern  . Not on file  Social History Narrative  . Not on file    Family History  Problem Relation Age of Onset  . Colon cancer Neg Hx   . Stomach cancer Neg Hx   . Esophageal cancer Neg Hx   . Rectal cancer Neg Hx      Immunization History  Administered Date(s) Administered  . Influenza,inj,Quad PF,6+ Mos 12/16/2016, 09/04/2018, 07/24/2019  . Td 07/22/2008  . Tdap 03/31/2008, 09/04/2018    Outpatient Encounter Medications as of 09/25/2019  Medication Sig  . Cholecalciferol (VITAMIN D3) 1.25 MG (50000 UT) CAPS Take 1 capsule by mouth once a week.  . [DISCONTINUED] Cholecalciferol 1.25 MG (50000 UT) TABS 50,000 units PO qwk for 12 weeks.   No facility-administered encounter medications on file as of 09/25/2019.      ROS: Gen: no fever, chills  Skin: no rash, itching ENT: no ear pain, ear drainage, nasal congestion, rhinorrhea, sinus pressure, sore throat Eyes: no blurry vision, double  vision Resp: no cough, wheeze,SOB CV: no CP, palpitations, LE edema,  GI: no heartburn, n/v/d/c, abd pain GU: no dysuria, urgency, frequency, hematuria  MSK: no joint pain, myalgias, back pain Neuro: no dizziness, headache, weakness, vertigo Psych: no depression, anxiety, insomnia   No Known Allergies  BP 134/80   Pulse 94   Ht 6\' 3"  (1.905 m)   Wt 287 lb 8 oz (130.4 kg)   SpO2 100%   BMI 35.94 kg/m   BP Readings from Last 3 Encounters:  09/25/19 134/80  09/11/19 120/82  09/04/18 122/84     Physical Exam  Constitutional: He is oriented to person, place, and time. He appears well-developed and well-nourished. No distress.  Cardiovascular: Normal rate and regular rhythm.  Pulmonary/Chest: Effort normal and breath sounds normal. No respiratory distress.  Neurological: He is alert and oriented to person, place, and time.  Psychiatric: He has a normal mood and affect. His behavior is  normal.     A/P:  1. Essential hypertension - well-controlled, pt is off med but was previously on losartan - BP improved to goal with weight loss  2. Vitamin D deficiency - pt was unaware of low Vit D and Rx for 50,000IU per week - pt will pick up Rx and start taking x 12-16wks then 200) daily

## 2019-10-09 ENCOUNTER — Other Ambulatory Visit: Payer: Self-pay | Admitting: Neurosurgery

## 2019-10-09 DIAGNOSIS — M5412 Radiculopathy, cervical region: Secondary | ICD-10-CM

## 2019-10-23 ENCOUNTER — Other Ambulatory Visit: Payer: BC Managed Care – PPO

## 2019-11-04 ENCOUNTER — Telehealth: Payer: Self-pay | Admitting: Family Medicine

## 2019-11-04 NOTE — Telephone Encounter (Signed)
Patient calling to inquire if a medication can be sent into pharmacy for his BP.    Patient wanted to leave reading for today.  134/92.   Patient denies any symptoms.

## 2019-11-06 ENCOUNTER — Telehealth (INDEPENDENT_AMBULATORY_CARE_PROVIDER_SITE_OTHER): Payer: BC Managed Care – PPO | Admitting: Family Medicine

## 2019-11-06 ENCOUNTER — Encounter: Payer: Self-pay | Admitting: Family Medicine

## 2019-11-06 VITALS — BP 156/102 | HR 58 | Ht 73.0 in | Wt 285.0 lb

## 2019-11-06 DIAGNOSIS — I10 Essential (primary) hypertension: Secondary | ICD-10-CM

## 2019-11-06 NOTE — Patient Instructions (Signed)
Goal is average blood pressure less than 140 / less than 90   DASH Eating Plan DASH stands for "Dietary Approaches to Stop Hypertension." The DASH eating plan is a healthy eating plan that has been shown to reduce high blood pressure (hypertension). It may also reduce your risk for type 2 diabetes, heart disease, and stroke. The DASH eating plan may also help with weight loss. What are tips for following this plan?  General guidelines  Avoid eating more than 2,300 mg (milligrams) of salt (sodium) a day. If you have hypertension, you may need to reduce your sodium intake to 1,500 mg a day.  Limit alcohol intake to no more than 1 drink a day for nonpregnant women and 2 drinks a day for men. One drink equals 12 oz of beer, 5 oz of wine, or 1 oz of hard liquor.  Work with your health care provider to maintain a healthy body weight or to lose weight. Ask what an ideal weight is for you.  Get at least 30 minutes of exercise that causes your heart to beat faster (aerobic exercise) most days of the week. Activities may include walking, swimming, or biking.  Work with your health care provider or diet and nutrition specialist (dietitian) to adjust your eating plan to your individual calorie needs. Reading food labels   Check food labels for the amount of sodium per serving. Choose foods with less than 5 percent of the Daily Value of sodium. Generally, foods with less than 300 mg of sodium per serving fit into this eating plan.  To find whole grains, look for the word "whole" as the first word in the ingredient list. Shopping  Buy products labeled as "low-sodium" or "no salt added."  Buy fresh foods. Avoid canned foods and premade or frozen meals. Cooking  Avoid adding salt when cooking. Use salt-free seasonings or herbs instead of table salt or sea salt. Check with your health care provider or pharmacist before using salt substitutes.  Do not fry foods. Cook foods using healthy methods such  as baking, boiling, grilling, and broiling instead.  Cook with heart-healthy oils, such as olive, canola, soybean, or sunflower oil. Meal planning  Eat a balanced diet that includes: ? 5 or more servings of fruits and vegetables each day. At each meal, try to fill half of your plate with fruits and vegetables. ? Up to 6-8 servings of whole grains each day. ? Less than 6 oz of lean meat, poultry, or fish each day. A 3-oz serving of meat is about the same size as a deck of cards. One egg equals 1 oz. ? 2 servings of low-fat dairy each day. ? A serving of nuts, seeds, or beans 5 times each week. ? Heart-healthy fats. Healthy fats called Omega-3 fatty acids are found in foods such as flaxseeds and coldwater fish, like sardines, salmon, and mackerel.  Limit how much you eat of the following: ? Canned or prepackaged foods. ? Food that is high in trans fat, such as fried foods. ? Food that is high in saturated fat, such as fatty meat. ? Sweets, desserts, sugary drinks, and other foods with added sugar. ? Full-fat dairy products.  Do not salt foods before eating.  Try to eat at least 2 vegetarian meals each week.  Eat more home-cooked food and less restaurant, buffet, and fast food.  When eating at a restaurant, ask that your food be prepared with less salt or no salt, if possible. What foods are recommended?  The items listed may not be a complete list. Talk with your dietitian about what dietary choices are best for you. Grains Whole-grain or whole-wheat bread. Whole-grain or whole-wheat pasta. Brown rice. [place]. Bulgur. Whole-grain and low-sodium cereals. Pita bread. Low-fat, low-sodium crackers. Whole-wheat flour tortillas. Vegetables Fresh or frozen vegetables (raw, steamed, roasted, or grilled). Low-sodium or reduced-sodium tomato and vegetable juice. Low-sodium or reduced-sodium tomato sauce and tomato paste. Low-sodium or reduced-sodium canned vegetables. Fruits All fresh,  dried, or frozen fruit. Canned fruit in natural juice (without added sugar). Meat and other protein foods Skinless chicken or [place]. Ground chicken or [place]. Pork with fat trimmed off. Fish and seafood. Egg whites. Dried beans, peas, or lentils. Unsalted nuts, nut butters, and seeds. Unsalted canned beans. Lean cuts of beef with fat trimmed off. Low-sodium, lean deli meat. Dairy Low-fat (1%) or fat-free (skim) milk. Fat-free, low-fat, or reduced-fat cheeses. Nonfat, low-sodium ricotta or cottage cheese. Low-fat or nonfat yogurt. Low-fat, low-sodium cheese. Fats and oils Soft margarine without trans fats. Vegetable oil. Low-fat, reduced-fat, or light mayonnaise and salad dressings (reduced-sodium). Canola, safflower, olive, soybean, and sunflower oils. Avocado. Seasoning and other foods Herbs. Spices. Seasoning mixes without salt. Unsalted popcorn and pretzels. Fat-free sweets. What foods are not recommended? The items listed may not be a complete list. Talk with your dietitian about what dietary choices are best for you. Grains Baked goods made with fat, such as croissants, muffins, or some breads. Dry pasta or rice meal packs. Vegetables Creamed or fried vegetables. Vegetables in a cheese sauce. Regular canned vegetables (not low-sodium or reduced-sodium). Regular canned tomato sauce and paste (not low-sodium or reduced-sodium). Regular tomato and vegetable juice (not low-sodium or reduced-sodium). Angie Fava. Olives. Fruits Canned fruit in a light or heavy syrup. Fried fruit. Fruit in cream or butter sauce. Meat and other protein foods Fatty cuts of meat. Ribs. Fried meat. Berniece Salines. Sausage. Bologna and other processed lunch meats. Salami. Fatback. Hotdogs. Bratwurst. Salted nuts and seeds. Canned beans with added salt. Canned or smoked fish. Whole eggs or egg yolks. Chicken or [place] with skin. Dairy Whole or 2% milk, cream, and half-and-half. Whole or full-fat cream cheese. Whole-fat or sweetened  yogurt. Full-fat cheese. Nondairy creamers. Whipped toppings. Processed cheese and cheese spreads. Fats and oils Butter. Stick margarine. Lard. Shortening. Ghee. Bacon fat. Tropical oils, such as coconut, palm kernel, or palm oil. Seasoning and other foods Salted popcorn and pretzels. Onion salt, garlic salt, seasoned salt, table salt, and sea salt. Worcestershire sauce. Tartar sauce. Barbecue sauce. Teriyaki sauce. Soy sauce, including reduced-sodium. Steak sauce. Canned and packaged gravies. Fish sauce. Oyster sauce. Cocktail sauce. Horseradish that you find on the shelf. Ketchup. Mustard. Meat flavorings and tenderizers. Bouillon cubes. Hot sauce and Tabasco sauce. Premade or packaged marinades. Premade or packaged taco seasonings. Relishes. Regular salad dressings. Where to find more information:  National Heart, Lung, and [place]: https://wilson-eaton.com/  American Heart Association: www.heart.org Summary  The DASH eating plan is a healthy eating plan that has been shown to reduce high blood pressure (hypertension). It may also reduce your risk for type 2 diabetes, heart disease, and stroke.  With the DASH eating plan, you should limit salt (sodium) intake to 2,300 mg a day. If you have hypertension, you may need to reduce your sodium intake to 1,500 mg a day.  When on the DASH eating plan, aim to eat more fresh fruits and vegetables, whole grains, lean proteins, low-fat dairy, and heart-healthy fats.  Work with your health care  provider or diet and nutrition specialist (dietitian) to adjust your eating plan to your individual calorie needs. This information is not intended to replace advice given to you by your health care provider. Make sure you discuss any questions you have with your health care provider. Document Released: 10/27/2011 Document Revised: 10/20/2017 Document Reviewed: 10/31/2016 Elsevier Patient Education  2020 [employer].   Hypertension, Adult Hypertension is  another name for high blood pressure. High blood pressure forces your heart to work harder to pump blood. This can cause problems over time. There are two numbers in a blood pressure reading. There is a top number (systolic) over a bottom number (diastolic). It is best to have a blood pressure that is below 120/80. Healthy choices can help lower your blood pressure, or you may need medicine to help lower it. What are the causes? The cause of this condition is not known. Some conditions may be related to high blood pressure. What increases the risk?  Smoking.  Having type 2 diabetes mellitus, high cholesterol, or both.  Not getting enough exercise or physical activity.  Being overweight.  Having too much fat, sugar, calories, or salt (sodium) in your diet.  Drinking too much alcohol.  Having long-term (chronic) kidney disease.  Having a family history of high blood pressure.  Age. Risk increases with age.  Race. You may be at higher risk if you are African American.  Gender. Men are at higher risk than women before age 46. After age 85, women are at higher risk than men.  Having obstructive sleep apnea.  Stress. What are the signs or symptoms?  High blood pressure may not cause symptoms. Very high blood pressure (hypertensive crisis) may cause: ? Headache. ? Feelings of worry or nervousness (anxiety). ? Shortness of breath. ? Nosebleed. ? A feeling of being sick to your stomach (nausea). ? Throwing up (vomiting). ? Changes in how you see. ? Very bad chest pain. ? Seizures. How is this treated?  This condition is treated by making healthy lifestyle changes, such as: ? Eating healthy foods. ? Exercising more. ? Drinking less alcohol.  Your health care provider may prescribe medicine if lifestyle changes are not enough to get your blood pressure under control, and if: ? Your top number is above 130. ? Your bottom number is above 80.  Your personal target blood  pressure may vary. Follow these instructions at home: Eating and drinking   If told, follow the DASH eating plan. To follow this plan: ? Fill one half of your plate at each meal with fruits and vegetables. ? Fill one fourth of your plate at each meal with whole grains. Whole grains include whole-wheat pasta, brown rice, and whole-grain bread. ? Eat or drink low-fat dairy products, such as skim milk or low-fat yogurt. ? Fill one fourth of your plate at each meal with low-fat (lean) proteins. Low-fat proteins include fish, chicken without skin, eggs, beans, and tofu. ? Avoid fatty meat, cured and processed meat, or chicken with skin. ? Avoid pre-made or processed food.  Eat less than 1,500 mg of salt each day.  Do not drink alcohol if: ? Your doctor tells you not to drink. ? You are pregnant, may be pregnant, or are planning to become pregnant.  If you drink alcohol: ? Limit how much you use to:  0-1 drink a day for women.  0-2 drinks a day for men. ? Be aware of how much alcohol is in your drink. In the [place]  one drink equals one 12 oz bottle of beer (355 mL), one 5 oz glass of wine (148 mL), or one 1 oz glass of hard liquor (44 mL). Lifestyle   Work with your doctor to stay at a healthy weight or to lose weight. Ask your doctor what the best weight is for you.  Get at least 30 minutes of exercise most days of the week. This may include walking, swimming, or biking.  Get at least 30 minutes of exercise that strengthens your muscles (resistance exercise) at least 3 days a week. This may include lifting weights or doing Pilates.  Do not use any products that contain nicotine or tobacco, such as cigarettes, e-cigarettes, and chewing tobacco. If you need help quitting, ask your doctor.  Check your blood pressure at home as told by your doctor.  Keep all follow-up visits as told by your doctor. This is important. Medicines  Take over-the-counter and prescription medicines only as  told by your doctor. Follow directions carefully.  Do not skip doses of blood pressure medicine. The medicine does not work as well if you skip doses. Skipping doses also puts you at risk for problems.  Ask your doctor about side effects or reactions to medicines that you should watch for. Contact a doctor if you:  Think you are having a reaction to the medicine you are taking.  Have headaches that keep coming back (recurring).  Feel dizzy.  Have swelling in your ankles.  Have trouble with your vision. Get help right away if you:  Get a very bad headache.  Start to feel mixed up (confused).  Feel weak or numb.  Feel faint.  Have very bad pain in your: ? Chest. ? Belly (abdomen).  Throw up more than once.  Have trouble breathing. Summary  Hypertension is another name for high blood pressure.  High blood pressure forces your heart to work harder to pump blood.  For most people, a normal blood pressure is less than 120/80.  Making healthy choices can help lower blood pressure. If your blood pressure does not get lower with healthy choices, you may need to take medicine. This information is not intended to replace advice given to you by your health care provider. Make sure you discuss any questions you have with your health care provider. Document Released: 04/25/2008 Document Revised: 07/18/2018 Document Reviewed: 07/18/2018 Elsevier Patient Education  2020 [employer].

## 2019-11-06 NOTE — Progress Notes (Signed)
Virtual Visit via Video Note  I connected with Edward Mccormick on 11/06/19 at  1:30 PM EST by a video enabled telemedicine application and verified that I am speaking with the correct person using two identifiers. Location patient: home Location provider: work  Persons participating in the virtual visit: patient, provider  I discussed the limitations of evaluation and management by telemedicine and the availability of in person appointments. The patient expressed understanding and agreed to proceed.  Chief Complaint  Patient presents with  . Hypertension    Pt brought a arm cuff & states he noticed his BP running high. highest was 147/99,lowests in the 130s/90  . Medication Management    Pt started back on his own taking losartan. Admits to take 3 tablets/day for the past 3 days      HPI: Edward Mccormick is a 58 y.o. male seen today to discuss concerns about his BP. He recently started using a home BP cuff at the urging of his daughter who is a Marine scientist and states BP 130-147/90-99. He has an old Rx for losartan 50mg  tabs which he restarted 1 tab daily then increased to 2 tabs and more recently and took 3 tabs together (150mg ) daily for the past 3 days.  Pt denies HA, dizziness, lightheadedness, CP, SOB, vision changes, LE edema.  Pt denies new or increased stress, no changes in diet or sleep.   Past Medical History:  Diagnosis Date  . Seasonal allergies     Past Surgical History:  Procedure Laterality Date  . INGUINAL HERNIA REPAIR    . PILONIDAL CYST EXCISION    . WISDOM TOOTH EXTRACTION      Family History  Problem Relation Age of Onset  . Colon cancer Neg Hx   . Stomach cancer Neg Hx   . Esophageal cancer Neg Hx   . Rectal cancer Neg Hx     Social History   Tobacco Use  . Smoking status: Never Smoker  . Smokeless tobacco: Never Used  Substance Use Topics  . Alcohol use: No  . Drug use: No     Current Outpatient Medications:  .  Cholecalciferol (VITAMIN D3) 1.25  MG (50000 UT) CAPS, Take 1 capsule by mouth once a week., Disp: , Rfl:  .  losartan (COZAAR) 50 MG tablet, Take 50 mg by mouth daily., Disp: , Rfl:   No Known Allergies    ROS: See pertinent positives and negatives per HPI.   EXAM:  VITALS per patient if applicable: BP (!) 0000000   Pulse (!) 58   Ht 6\' 1"  (1.854 m)   Wt 285 lb (129.3 kg)   BMI 37.60 kg/m   BP Readings from Last 3 Encounters:  11/06/19 (!) 156/102  09/25/19 134/80  09/11/19 120/82   Pulse Readings from Last 3 Encounters:  11/06/19 (!) 58  09/25/19 94  09/11/19 63   Wt Readings from Last 3 Encounters:  11/06/19 285 lb (129.3 kg)  09/25/19 287 lb 8 oz (130.4 kg)  09/11/19 288 lb (130.6 kg)     GENERAL: alert, oriented, appears well and in no acute distress  HEENT: atraumatic, conjunctiva clear, no obvious abnormalities on inspection of external nose and ears  NECK: normal movements of the head and neck  LUNGS: on inspection no signs of respiratory distress, breathing rate appears normal, no obvious gross SOB, gasping or wheezing, no conversational dyspnea  CV: no obvious cyanosis  MS: moves all visible extremities without noticeable abnormality  PSYCH/NEURO: pleasant and  cooperative, speech and thought processing grossly intact   ASSESSMENT AND PLAN:  1. Essential hypertension - resume losartan 50mg  1 tab daily - check BP daily and keep log of readings - low sodium diet and encouraged regular CVB exercise as much as tolerated - f/u in 1.5-2 wks with BP readings or sooner PRN   I discussed the assessment and treatment plan with the patient. The patient was provided an opportunity to ask questions and all were answered. The patient agreed with the plan and demonstrated an understanding of the instructions.   The patient was advised to call back or seek an in-person evaluation if the symptoms worsen or if the condition fails to improve as anticipated.   Letta Median, DO

## 2019-11-07 ENCOUNTER — Encounter: Payer: Self-pay | Admitting: Family Medicine

## 2019-11-08 ENCOUNTER — Encounter: Payer: Self-pay | Admitting: Family Medicine

## 2019-11-09 ENCOUNTER — Encounter: Payer: Self-pay | Admitting: Family Medicine

## 2019-11-10 ENCOUNTER — Encounter: Payer: Self-pay | Admitting: Family Medicine

## 2019-11-11 ENCOUNTER — Encounter: Payer: Self-pay | Admitting: Family Medicine

## 2019-11-12 ENCOUNTER — Encounter: Payer: Self-pay | Admitting: Family Medicine

## 2019-11-13 ENCOUNTER — Encounter: Payer: Self-pay | Admitting: Family Medicine

## 2019-11-14 ENCOUNTER — Encounter: Payer: Self-pay | Admitting: Family Medicine

## 2019-11-15 ENCOUNTER — Encounter: Payer: Self-pay | Admitting: Family Medicine

## 2019-11-16 ENCOUNTER — Encounter: Payer: Self-pay | Admitting: Family Medicine

## 2019-11-17 ENCOUNTER — Encounter: Payer: Self-pay | Admitting: Family Medicine

## 2019-11-19 ENCOUNTER — Encounter: Payer: Self-pay | Admitting: Family Medicine

## 2019-11-19 DIAGNOSIS — I10 Essential (primary) hypertension: Secondary | ICD-10-CM

## 2019-11-19 MED ORDER — LOSARTAN POTASSIUM 100 MG PO TABS: 100.0000 mg | ORAL_TABLET | Freq: Every day | ORAL | 3 refills | Status: DC

## 2019-11-21 ENCOUNTER — Encounter: Payer: Self-pay | Admitting: Family Medicine

## 2019-11-22 ENCOUNTER — Encounter: Payer: Self-pay | Admitting: Family Medicine

## 2019-11-25 ENCOUNTER — Encounter: Payer: Self-pay | Admitting: Family Medicine

## 2019-11-26 ENCOUNTER — Encounter (INDEPENDENT_AMBULATORY_CARE_PROVIDER_SITE_OTHER): Payer: Self-pay | Admitting: Family Medicine

## 2019-11-26 ENCOUNTER — Ambulatory Visit (INDEPENDENT_AMBULATORY_CARE_PROVIDER_SITE_OTHER): Payer: BC Managed Care – PPO | Admitting: Family Medicine

## 2019-11-26 ENCOUNTER — Other Ambulatory Visit: Payer: Self-pay

## 2019-11-26 ENCOUNTER — Encounter: Payer: Self-pay | Admitting: Family Medicine

## 2019-11-26 VITALS — BP 138/92 | HR 64 | Temp 98.1°F | Ht 73.0 in | Wt 295.0 lb

## 2019-11-26 DIAGNOSIS — R5383 Other fatigue: Secondary | ICD-10-CM

## 2019-11-26 DIAGNOSIS — E559 Vitamin D deficiency, unspecified: Secondary | ICD-10-CM

## 2019-11-26 DIAGNOSIS — Z1331 Encounter for screening for depression: Secondary | ICD-10-CM | POA: Diagnosis not present

## 2019-11-26 DIAGNOSIS — R0602 Shortness of breath: Secondary | ICD-10-CM | POA: Diagnosis not present

## 2019-11-26 DIAGNOSIS — R739 Hyperglycemia, unspecified: Secondary | ICD-10-CM

## 2019-11-26 DIAGNOSIS — Z9189 Other specified personal risk factors, not elsewhere classified: Secondary | ICD-10-CM | POA: Diagnosis not present

## 2019-11-26 DIAGNOSIS — Z0289 Encounter for other administrative examinations: Secondary | ICD-10-CM

## 2019-11-26 DIAGNOSIS — I10 Essential (primary) hypertension: Secondary | ICD-10-CM

## 2019-11-26 DIAGNOSIS — Z6839 Body mass index (BMI) 39.0-39.9, adult: Secondary | ICD-10-CM

## 2019-11-27 LAB — FOLATE: Folate: 7.8 ng/mL (ref >3.0)

## 2019-11-27 LAB — INSULIN, RANDOM: INSULIN: 12.8 u[IU]/mL (ref 2.6–24.9)

## 2019-11-27 LAB — CBC WITH DIFFERENTIAL/PLATELET
Basophils Absolute: 0 10*3/uL (ref 0.0–0.2)
Basos: 1 %
EOS (ABSOLUTE): 0.1 10*3/uL (ref 0.0–0.4)
Eos: 2 %
Hematocrit: 40.9 % (ref 37.5–51.0)
Hemoglobin: 13.5 g/dL (ref 13.0–17.7)
Immature Grans (Abs): 0 10*3/uL (ref 0.0–0.1)
Immature Granulocytes: 0 %
Lymphocytes Absolute: 2.2 10*3/uL (ref 0.7–3.1)
Lymphs: 37 %
MCH: 30.1 pg (ref 26.6–33.0)
MCHC: 33 g/dL (ref 31.5–35.7)
MCV: 91 fL (ref 79–97)
Monocytes Absolute: 0.6 10*3/uL (ref 0.1–0.9)
Monocytes: 10 %
Neutrophils Absolute: 3 10*3/uL (ref 1.4–7.0)
Neutrophils: 50 %
Platelets: 172 10*3/uL (ref 150–450)
RBC: 4.48 x10E6/uL (ref 4.14–5.80)
RDW: 13.6 % (ref 11.6–15.4)
WBC: 6 10*3/uL (ref 3.4–10.8)

## 2019-11-27 LAB — HEMOGLOBIN A1C
Est. average glucose Bld gHb Est-mCnc: 114 mg/dL
Hgb A1c MFr Bld: 5.6 % (ref 4.8–5.6)

## 2019-11-27 LAB — T3: T3, Total: 127 ng/dL (ref 71–180)

## 2019-11-27 LAB — COMPREHENSIVE METABOLIC PANEL
ALT: 21 IU/L (ref 0–44)
AST: 22 IU/L (ref 0–40)
Albumin/Globulin Ratio: 1.5 (ref 1.2–2.2)
Albumin: 3.9 g/dL (ref 3.8–4.9)
Alkaline Phosphatase: 65 IU/L (ref 39–117)
BUN/Creatinine Ratio: 9 (ref 9–20)
BUN: 9 mg/dL (ref 6–24)
Bilirubin Total: 0.3 mg/dL (ref 0.0–1.2)
CO2: 25 mmol/L (ref 20–29)
Calcium: 8.6 mg/dL — ABNORMAL LOW (ref 8.7–10.2)
Chloride: 104 mmol/L (ref 96–106)
Creatinine, Ser: 0.98 mg/dL (ref 0.76–1.27)
GFR calc Af Amer: 99 mL/min/{1.73_m2} (ref >59)
GFR calc non Af Amer: 85 mL/min/{1.73_m2} (ref >59)
Globulin, Total: 2.6 g/dL (ref 1.5–4.5)
Glucose: 88 mg/dL (ref 65–99)
Potassium: 4 mmol/L (ref 3.5–5.2)
Sodium: 142 mmol/L (ref 134–144)
Total Protein: 6.5 g/dL (ref 6.0–8.5)

## 2019-11-27 LAB — LIPID PANEL WITH LDL/HDL RATIO
Cholesterol, Total: 215 mg/dL — ABNORMAL HIGH (ref 100–199)
HDL: 59 mg/dL (ref >39)
LDL Chol Calc (NIH): 133 mg/dL — ABNORMAL HIGH (ref 0–99)
LDL/HDL Ratio: 2.3 ratio (ref 0.0–3.6)
Triglycerides: 129 mg/dL (ref 0–149)
VLDL Cholesterol Cal: 23 mg/dL (ref 5–40)

## 2019-11-27 LAB — T4, FREE: Free T4: 1.15 ng/dL (ref 0.82–1.77)

## 2019-11-27 LAB — TSH: TSH: 4.38 u[IU]/mL (ref 0.450–4.500)

## 2019-11-27 LAB — VITAMIN B12: Vitamin B-12: 466 pg/mL (ref 232–1245)

## 2019-11-27 LAB — VITAMIN D 25 HYDROXY (VIT D DEFICIENCY, FRACTURES): Vit D, 25-Hydroxy: 34.5 ng/mL (ref 30.0–100.0)

## 2019-11-28 NOTE — Progress Notes (Signed)
Subjective:   Chief Complaint: OBESITY Edward Mccormick (MR# [id number]) is a 59 y.o. male who presents for evaluation and treatment of obesity and related comorbidities. Current BMI is Body mass index is 38.92 kg/m. Edward Mccormick has been struggling with his weight for many years and has been unsuccessful in either losing weight, maintaining weight loss, or reaching his healthy weight goal.  Edward Mccormick states he is currently in the action stage of change and ready to dedicate time achieving and maintaining a healthier weight. Edward Mccormick is interested in becoming our patient and working on intensive lifestyle modifications including (but not limited to) diet and exercise for weight loss.  Edward Mccormick's habits were reviewed today and are as follows: His family eats meals together, his desired weight loss is 85 lbs, he started gaining weight at the age of 63, he is a picky eater and doesn't like to eat healthier foods, he has significant food cravings issues, he snacks frequently in the evenings, he skips meals frequently, he is frequently drinking liquids with calories, he frequently makes poor food choices and he frequently eats larger portions than normal.  Depression Screen Edward Mccormick's Food and Mood (modified PHQ-9) score was Depression screen PHQ 2/9 11/26/2019  Decreased Interest 1  Down, Depressed, Hopeless 0  PHQ - 2 Score 1  Altered sleeping 1  Tired, decreased energy 1  Change in appetite 1  Feeling bad or failure about yourself  0  Trouble concentrating 1  Moving slowly or fidgety/restless 0  Suicidal thoughts 0  PHQ-9 Score 5  Difficult doing work/chores Not difficult at all   1. Other fatigue Edward Mccormick feels his energy is lower than it should be. This has worsened with weight gain and has not worsened recently. Edward Mccormick admits to daytime somnolence and  denies waking up still tired. Patient is at risk for obstructive sleep apnea. Edward Mccormick has a history of symptoms of daytime fatigue. Patient generally gets  8 hours of sleep per night, and states they generally have generally restful sleep. Snoring is not present. Apneic episodes are not present. Epworth Sleepiness Score is 5.  2. Shortness of breath on exertion Edward Mccormick notes increasing shortness of breath with exercising and seems to be worsening over time with weight gain. He notes getting out of breath sooner with activity than he used to. This has not gotten worse recently. Edward Mccormick denies shortness of breath at rest or orthopnea.  3. Essential hypertension Edward Mccormick's blood pressure is elevated today. He is not on medications. He is attempting to improve with diet and weight loss.  4. Vitamin D deficiency Edward Mccormick is not on Vit D currently, but his level has been low in the past.  5. Hyperglycemia Edward Mccormick has had elevated glucose in the past, but may not have been fasting. He does not have a recent A1c. I discussed labs with the patient today.  6. Depression screening Edward Mccormick's depression screening is 5.  7. At risk for heart disease Edward Mccormick is at a higher than average risk for cardiovascular disease due to obesity. Reviewed: taking medications as instructed, no medication side effects noted, no TIA's, no chest pain on exertion and no swelling of ankles.  Assessment/Plan:   1. Other fatigue Edward Mccormick was informed fatigue may be related to obesity, depression or many other causes. Labs will be ordered today, and in the meanwhile Edward Mccormick has agreed to work on diet, exercise and weight loss.  - EKG 12-Lead - CBC w/Diff/Platelet - T3 - T4, free -  TSH  2. Shortness of breath on exertion Edward Mccormick's shortness of breath appears to be obesity related and exercise induced. He has agreed to work on weight loss and gradually increase exercise to treat his exercise induced shortness of breath. We will continue to monitor closely.  - Lipid Panel With LDL/HDL Ratio  3. Essential hypertension We will check labs today. Edward Mccormick will start his diet and work on  healthy weight loss and exercise to improve blood pressure control. We will watch for signs of hypotension as he continues his lifestyle modifications.  4. Vitamin D deficiency We will check labs today. Low Vitamin D level contributes to fatigue and are associated with obesity, breast, and colon cancer. He will follow-up for routine testing of vitamin D, at least 2-3 times per year to avoid over-replacement.  - B12 - Folate - Vitamin D (25 hydroxy)  5. Hyperglycemia We will check labs today. Edward Mccormick will start his diet prescription and will continue to follow up with [place] as directed to monitor his progress.   - Comprehensive Metabolic Panel (CMET) - HgB A1c - Insulin, random  6. Depression screening Edward Mccormick had a positive depression screening. Depression is commonly associated with obesity and often results in emotional eating behaviors. We will monitor this closely and work on CBT to help improve the non-hunger eating patterns. Referral to Psychology may be required if no improvement is seen as he continues in our clinic.  7. At risk for heart disease Edward Mccormick was given (~30 minutes) coronary artery disease prevention counseling today. He is 59 y.o. male and has risk factors for heart disease including obesity. We discussed intensive lifestyle modifications today with an emphasis on specific weight loss instructions and strategies.   8. Class 2 severe obesity with serious comorbidity and body mass index (BMI) of 39.0 to 39.9 in adult, unspecified obesity type Edward Mccormick) Edward Mccormick is currently in the action stage of change and his goal is to continue with weight loss efforts. I recommend Edward Mccormick begin the structured treatment plan as follows:  He has agreed to Category 3 Plan.  We discussed the following exercise goals today: For substantial health benefits, adults should do at least 150 minutes (2 hours and 30 minutes) a week of moderate-intensity, or 75 minutes (1 hour and 15 minutes) a week of  vigorous-intensity aerobic physical activity, or an equivalent combination of moderate- and vigorous-intensity aerobic activity. Aerobic activity should be performed in episodes of at least 10 minutes, and preferably, it should be spread throughout the week. Adults should also include muscle-strengthening activities that involve all major muscle groups on 2 or more days a week. We discussed the following behavioral modification strategies today: increasing lean protein intake, decreasing simple carbohydrates and no skipping meals.  He was informed of the importance of frequent follow-up visits to maximize his success with intensive lifestyle modifications for his multiple health conditions. He was informed we would discuss his lab results at his next visit unless there is a critical issue that needs to be addressed sooner. Edward Mccormick agreed to keep his next visit at the agreed upon time to discuss these results.  Objective:   Blood pressure (!) 138/92, pulse 64, temperature 98.1 F (36.7 C), temperature source Oral, height 6\' 1"  (1.854 m), weight 295 lb (133.8 kg), SpO2 99 %. Body mass index is 38.92 kg/m.  EKG: normal EKG, normal sinus rhythm.  Indirect Calorimeter completed today shows a VO2 of 273 and a REE of 1903.  His calculated basal metabolic rate is 123456  thus his basal metabolic rate is worse than expected.  General: Cooperative, alert, well developed, in no acute distress. HEENT: Conjunctivae and lids unremarkable. Neck: No thyromegaly.  Cardiovascular: Regular rhythm.  Lungs: Normal work of breathing. Extremities: No edema.  Neurologic: No focal deficits.   Lab Results  Component Value Date   CREATININE 0.98 11/26/2019   BUN 9 11/26/2019   NA 142 11/26/2019   K 4.0 11/26/2019   CL 104 11/26/2019   CO2 25 11/26/2019   Lab Results  Component Value Date   ALT 21 11/26/2019   AST 22 11/26/2019   ALKPHOS 65 11/26/2019   BILITOT 0.3 11/26/2019   Lab Results  Component Value  Date   HGBA1C 5.6 11/26/2019   Lab Results  Component Value Date   INSULIN 12.8 11/26/2019   Lab Results  Component Value Date   TSH 4.380 11/26/2019   Lab Results  Component Value Date   CHOL 215 (H) 11/26/2019   HDL 59 11/26/2019   LDLCALC 133 (H) 11/26/2019   LDLDIRECT 145.5 10/14/2011   TRIG 129 11/26/2019   CHOLHDL 4 09/11/2019   Lab Results  Component Value Date   WBC 6.0 11/26/2019   HGB 13.5 11/26/2019   HCT 40.9 11/26/2019   MCV 91 11/26/2019   PLT 172 11/26/2019   No results found for: IRON, TIBC, FERRITIN  Attestation Statements:   Reviewed by clinician on day of visit: allergies, medications, problem list, medical history, surgical history, family history, social history and previous encounter notes.  This visit occurred during the SARS-CoV-2 public health emergency.  Safety protocols were in place, including screening questions prior to the visit, additional usage of staff PPE, and extensive cleaning of exam room while observing appropriate contact time as indicated for disinfecting solutions. (CPT [id number])  I, Trixie Dredge, am acting as transcriptionist for Dennard Nip, MD.  I have reviewed the above documentation for accuracy and completeness, and I agree with the above. - Dennard Nip, MD

## 2019-12-10 ENCOUNTER — Encounter (INDEPENDENT_AMBULATORY_CARE_PROVIDER_SITE_OTHER): Payer: Self-pay | Admitting: Family Medicine

## 2019-12-10 ENCOUNTER — Other Ambulatory Visit: Payer: Self-pay

## 2019-12-10 ENCOUNTER — Ambulatory Visit (INDEPENDENT_AMBULATORY_CARE_PROVIDER_SITE_OTHER): Payer: BC Managed Care – PPO | Admitting: Family Medicine

## 2019-12-10 VITALS — BP 104/70 | HR 71 | Temp 98.1°F | Ht 73.0 in | Wt 286.0 lb

## 2019-12-10 DIAGNOSIS — E559 Vitamin D deficiency, unspecified: Secondary | ICD-10-CM

## 2019-12-10 DIAGNOSIS — E7849 Other hyperlipidemia: Secondary | ICD-10-CM | POA: Diagnosis not present

## 2019-12-10 DIAGNOSIS — Z9189 Other specified personal risk factors, not elsewhere classified: Secondary | ICD-10-CM | POA: Diagnosis not present

## 2019-12-10 DIAGNOSIS — R7303 Prediabetes: Secondary | ICD-10-CM

## 2019-12-10 DIAGNOSIS — Z6837 Body mass index (BMI) 37.0-37.9, adult: Secondary | ICD-10-CM

## 2019-12-10 MED ORDER — VITAMIN D3 1.25 MG (50000 UT) PO CAPS: 1.0000 | ORAL_CAPSULE | ORAL | 0 refills | Status: DC

## 2019-12-11 NOTE — Progress Notes (Signed)
Chief Complaint:   OBESITY Edward Mccormick is here to discuss his progress with his obesity treatment plan along with follow-up of his obesity related diagnoses. Edward Mccormick is on the Category 3 Plan and states he is following his eating plan approximately 90% of the time. Edward Mccormick states he is doing 0 minutes 0 times per week.  Today's visit was #: 2 Starting weight: 295 lbs Starting date: 11/26/2019 Today's weight: 286 lbs Today's date: 12/10/2019 Total lbs lost to date: 9 Total lbs lost since last in-office visit: 9  Interim History: Edward Mccormick has done very well with weight loss on his eating plan. His hunger gas been controlled, but he would like to have more options at breakfast and lunch.  Subjective:   1. Hyperlipidemia (Pure) Edward Mccormick's diagnosis is new. His LDL is elevated and he is not on statin. He would like to improve with diet. I discussed labs with the patient today.  2. Vitamin D deficiency Edward Mccormick is on Vit D prescription and his labs have improved, but his level is not yet at goal. I discussed labs with the patient today.  3. Pre-diabetes Edward Mccormick's diagnosis is new. He has a mildly elevated A1c and elevated fasting insulin, which increases the risk of diabetes mellitus and encourages weight gain. I discussed labs with the patient today.  4. At risk for heart disease Edward Mccormick is at a higher than average risk for cardiovascular disease due to obesity. Reviewed: no chest pain on exertion, no dyspnea on exertion, and no swelling of ankles.  Assessment/Plan:   1. Hyperlipidemia (Pure) Cardiovascular risk and specific lipid/LDL goals reviewed. We discussed several lifestyle modifications today and Tyrei will continue to work on diet, exercise and weight loss efforts. We will recheck labs in 3 months. Orders and follow up as documented in patient record.   Counseling Intensive lifestyle modifications are the first line treatment for this issue. . Dietary changes: Increase soluble fiber. Decrease  simple carbohydrates. . Exercise changes: Moderate to vigorous-intensity aerobic activity 150 minutes per week if tolerated. . Lipid-lowering medications: see documented in medical record.  2. Vitamin D deficiency Low Vitamin D level contributes to fatigue and are associated with obesity, breast, and colon cancer. We will refill prescription Vit D for 1 month, and we will recheck labs in 3 months. Edward Mccormick will follow-up for routine testing of Vitamin D, at least 2-3 times per year to avoid over-replacement. We will continue to monitor.  3. Pre-diabetes Edward Mccormick will continue to work on weight loss, diet, exercise, and decreasing simple carbohydrates to help decrease the risk of diabetes.   4. At risk for heart disease Edward Mccormick was given approximately 30 minutes of coronary artery disease prevention counseling today. He is 59 y.o. male and has risk factors for heart disease including obesity. We discussed intensive lifestyle modifications today with an emphasis on specific weight loss instructions and strategies.   Repetitive spaced learning was employed today to elicit superior memory formation and behavioral change.  5. Class 2 severe obesity with serious comorbidity and body mass index (BMI) of 37.0 to 37.9 in adult, unspecified obesity type Edward Mccormick) Edward Mccormick is currently in the action stage of change. As such, his goal is to continue with weight loss efforts. He has agreed to the Category 3 Plan.   Additional Category 3 breakfast options and lunch options were given, and Handout: Spices.  Behavioral modification strategies: increasing lean protein intake and decreasing simple carbohydrates.  Edward Mccormick has agreed to follow-up with our clinic in 2 weeks.  He was informed of the importance of frequent follow-up visits to maximize his success with intensive lifestyle modifications for his multiple health conditions.   Objective:   Blood pressure 104/70, pulse 71, temperature 98.1 F (36.7 C), temperature  source Oral, height 6\' 1"  (1.854 m), weight 286 lb (129.7 kg), SpO2 100 %. Body mass index is 37.73 kg/m.  General: Cooperative, alert, well developed, in no acute distress. HEENT: Conjunctivae and lids unremarkable. Cardiovascular: Regular rhythm.  Lungs: Normal work of breathing. Neurologic: No focal deficits.   Lab Results  Component Value Date   CREATININE 0.98 11/26/2019   BUN 9 11/26/2019   NA 142 11/26/2019   K 4.0 11/26/2019   CL 104 11/26/2019   CO2 25 11/26/2019   Lab Results  Component Value Date   ALT 21 11/26/2019   AST 22 11/26/2019   ALKPHOS 65 11/26/2019   BILITOT 0.3 11/26/2019   Lab Results  Component Value Date   HGBA1C 5.6 11/26/2019   Lab Results  Component Value Date   INSULIN 12.8 11/26/2019   Lab Results  Component Value Date   TSH 4.380 11/26/2019   Lab Results  Component Value Date   CHOL 215 (H) 11/26/2019   HDL 59 11/26/2019   LDLCALC 133 (H) 11/26/2019   LDLDIRECT 145.5 10/14/2011   TRIG 129 11/26/2019   CHOLHDL 4 09/11/2019   Lab Results  Component Value Date   WBC 6.0 11/26/2019   HGB 13.5 11/26/2019   HCT 40.9 11/26/2019   MCV 91 11/26/2019   PLT 172 11/26/2019   No results found for: IRON, TIBC, FERRITIN  Attestation Statements:   Reviewed by clinician on day of visit: allergies, medications, problem list, medical history, surgical history, family history, social history, and previous encounter notes.   I, Trixie Dredge, am acting as transcriptionist for Dennard Nip, MD.  I have reviewed the above documentation for accuracy and completeness, and I agree with the above. -  Dennard Nip, MD

## 2019-12-14 ENCOUNTER — Ambulatory Visit: Payer: BC Managed Care – PPO | Attending: Internal Medicine

## 2019-12-14 DIAGNOSIS — Z23 Encounter for immunization: Secondary | ICD-10-CM | POA: Insufficient documentation

## 2019-12-20 ENCOUNTER — Encounter (INDEPENDENT_AMBULATORY_CARE_PROVIDER_SITE_OTHER): Payer: Self-pay | Admitting: Family Medicine

## 2019-12-25 ENCOUNTER — Ambulatory Visit (INDEPENDENT_AMBULATORY_CARE_PROVIDER_SITE_OTHER): Payer: BC Managed Care – PPO | Admitting: Family Medicine

## 2020-01-02 ENCOUNTER — Encounter (INDEPENDENT_AMBULATORY_CARE_PROVIDER_SITE_OTHER): Payer: Self-pay | Admitting: Family Medicine

## 2020-01-02 ENCOUNTER — Other Ambulatory Visit: Payer: Self-pay

## 2020-01-02 ENCOUNTER — Ambulatory Visit (INDEPENDENT_AMBULATORY_CARE_PROVIDER_SITE_OTHER): Payer: BC Managed Care – PPO | Admitting: Family Medicine

## 2020-01-02 VITALS — BP 113/85 | HR 91 | Temp 98.4°F | Ht 73.0 in | Wt 284.0 lb

## 2020-01-02 DIAGNOSIS — E559 Vitamin D deficiency, unspecified: Secondary | ICD-10-CM | POA: Diagnosis not present

## 2020-01-02 DIAGNOSIS — Z9189 Other specified personal risk factors, not elsewhere classified: Secondary | ICD-10-CM | POA: Diagnosis not present

## 2020-01-02 DIAGNOSIS — I10 Essential (primary) hypertension: Secondary | ICD-10-CM | POA: Diagnosis not present

## 2020-01-02 DIAGNOSIS — E8881 Metabolic syndrome: Secondary | ICD-10-CM

## 2020-01-02 DIAGNOSIS — Z6837 Body mass index (BMI) 37.0-37.9, adult: Secondary | ICD-10-CM

## 2020-01-02 DIAGNOSIS — E88819 Insulin resistance, unspecified: Secondary | ICD-10-CM

## 2020-01-02 MED ORDER — LOSARTAN POTASSIUM 100 MG PO TABS: 100.0000 mg | ORAL_TABLET | Freq: Every day | ORAL | 0 refills | Status: DC

## 2020-01-02 MED ORDER — VITAMIN D3 1.25 MG (50000 UT) PO CAPS: 1.0000 | ORAL_CAPSULE | ORAL | 0 refills | Status: DC

## 2020-01-02 NOTE — Progress Notes (Signed)
Chief Complaint:   OBESITY Edward Mccormick is here to discuss his progress with his obesity treatment plan along with follow-up of his obesity related diagnoses. [name] is on the Category 3 Plan and states he is following his eating plan approximately 85% of the time. [name] states he is walking 30 minutes 3 times per week.  Today's visit was #: 3 Starting weight: 295 lbs Starting date: 11/26/2019 Today's weight: 284 lbs Today's date: 01/02/2020 Total lbs lost to date: 11 Total lbs lost since last in-office visit: 2  Interim History: Edward Mccormick likes the plan and he is sticking to it well. He did indulge somewhat on Super Bowl Sunday. He is getting all of the protein on the plan. He has cut out sweet tea.  Subjective:   Essential hypertension  Edward Mccormick has a diagnosis of hypertension. His hypertension is well controlled after recent increase of losartan dose by PCP.  BP Readings from Last 3 Encounters:  01/02/20 113/85  12/10/19 104/70  11/26/19 (!) 138/92   Lab Results  Component Value Date   CREATININE 0.98 11/26/2019   CREATININE 1.17 09/11/2019   CREATININE 1.11 09/04/2018   Insulin resistance [name] has a diagnosis of insulin resistance based on his elevated fasting insulin level (12.8). He continues to work on diet and exercise to decrease his risk of diabetes. He denies polyphagia.  Lab Results  Component Value Date   INSULIN 12.8 11/26/2019   Lab Results  Component Value Date   HGBA1C 5.6 11/26/2019   Vitamin D deficiency  Edward Mccormick's Vitamin D level was 11.34 on 11/26/19 and is not at goal. He is on prescription vit D weekly. He admits fatigue.  At risk for osteoporosis Edward Mccormick is at higher risk of osteopenia and osteoporosis due to Vitamin D deficiency.   Assessment/Plan:   Essential hypertension  [name] is working on healthy weight loss and exercise to improve blood pressure control. [name] agrees to start Losartan and a new prescription was written today for Losartan  100 mg once daily #30 with no refills. We will watch for signs of hypotension as he continues his lifestyle modifications.  Insulin resistance [name] will continue with the meal plan and he will continue to work on weight loss, exercise, and decreasing simple carbohydrates to help decrease the risk of diabetes. [name] agreed to follow-up with [place] as directed to closely monitor his progress.  Vitamin D deficiency  Low Vitamin D level contributes to fatigue and are associated with obesity, breast, and colon cancer. [name] agrees to continue to take prescription Vitamin D @50 ,000 IU every week #12 with no refills and she will follow-up for routine testing of Vitamin D, at least 2-3 times per year to avoid over-replacement.  At risk for osteoporosis Edward Mccormick was given approximately 15 minutes of osteoporosis prevention counseling today. Edward Mccormick is at risk for osteopenia and osteoporosis due to his Vitamin D deficiency. He was encouraged to take his Vitamin D and follow his higher calcium diet and increase strengthening exercise to help strengthen his bones and decrease his risk of osteopenia and osteoporosis.  Repetitive spaced learning was employed today to elicit superior memory formation and behavioral change.  Class 2 severe obesity with serious comorbidity and body mass index (BMI) of 37.0 to 37.9 in adult, unspecified obesity type [facility]) Edward Mccormick is currently in the action stage of change. As such, his goal is to continue with weight loss efforts. He has agreed to the Category 3 Plan with breakfast and lunch options.  Exercise goals: Edward Mccormick will continue his current exercise regimen.  Behavioral modification strategies: better snacking choices and emotional eating strategies.  Edward Mccormick has agreed to follow-up with our clinic in 2 weeks. He was informed of the importance of frequent follow-up visits to maximize his success with intensive lifestyle modifications for his multiple health conditions.   Objective:    Blood pressure 113/85, pulse 91, temperature 98.4 F (36.9 C), temperature source Oral, height 6\' 1"  (1.854 m), weight 284 lb (128.8 kg), SpO2 100 %. Body mass index is 37.47 kg/m.  General: Cooperative, alert, well developed, in no acute distress. HEENT: Conjunctivae and lids unremarkable. Cardiovascular: Regular rhythm.  Lungs: Normal work of breathing. Neurologic: No focal deficits.   Lab Results  Component Value Date   CREATININE 0.98 11/26/2019   BUN 9 11/26/2019   NA 142 11/26/2019   K 4.0 11/26/2019   CL 104 11/26/2019   CO2 25 11/26/2019   Lab Results  Component Value Date   ALT 21 11/26/2019   AST 22 11/26/2019   ALKPHOS 65 11/26/2019   BILITOT 0.3 11/26/2019   Lab Results  Component Value Date   HGBA1C 5.6 11/26/2019   Lab Results  Component Value Date   INSULIN 12.8 11/26/2019   Lab Results  Component Value Date   TSH 4.380 11/26/2019   Lab Results  Component Value Date   CHOL 215 (H) 11/26/2019   HDL 59 11/26/2019   LDLCALC 133 (H) 11/26/2019   LDLDIRECT 145.5 10/14/2011   TRIG 129 11/26/2019   CHOLHDL 4 09/11/2019   Lab Results  Component Value Date   WBC 6.0 11/26/2019   HGB 13.5 11/26/2019   HCT 40.9 11/26/2019   MCV 91 11/26/2019   PLT 172 11/26/2019   No results found for: IRON, TIBC, FERRITIN   Ref. Range 11/26/2019 11:34  Vitamin D, 25-Hydroxy Latest Ref Range: 30.0 - 100.0 ng/mL 34.5    Attestation Statements:   Reviewed by clinician on day of visit: allergies, medications, problem list, medical history, surgical history, family history, social history, and previous encounter notes.  Corey Skains, am acting as Location manager for [employer], FNP-C.  I have reviewed the above documentation for accuracy and completeness, and I agree with the above. -  London Tarnowski [employer], FNP-C

## 2020-01-08 ENCOUNTER — Encounter (INDEPENDENT_AMBULATORY_CARE_PROVIDER_SITE_OTHER): Payer: Self-pay | Admitting: Family Medicine

## 2020-01-11 ENCOUNTER — Ambulatory Visit: Payer: BC Managed Care – PPO

## 2020-01-18 ENCOUNTER — Ambulatory Visit: Payer: BC Managed Care – PPO | Attending: Internal Medicine

## 2020-01-18 DIAGNOSIS — Z23 Encounter for immunization: Secondary | ICD-10-CM

## 2020-01-18 NOTE — Progress Notes (Signed)
   Covid-19 Vaccination Clinic  Name:  Edward Mccormick    MRN: [id number] DOB: Feb 18, 1961  01/18/2020  [name] was observed post Covid-19 immunization for 15 minutes without incidence. He was provided with Vaccine Information Sheet and instruction to access the V-Safe system.   [name] was instructed to call 911 with any severe reactions post vaccine: Marland Kitchen Difficulty breathing  . Swelling of your face and throat  . A fast heartbeat  . A bad rash all over your body  . Dizziness and weakness    Immunizations Administered    Name Date Dose VIS Date Route   Moderna COVID-19 Vaccine 01/18/2020  9:22 AM 0.5 mL 10/22/2019 Intramuscular   Manufacturer: Moderna   Lot: [id number]   [id number]

## 2020-01-20 ENCOUNTER — Other Ambulatory Visit: Payer: Self-pay

## 2020-01-20 ENCOUNTER — Encounter (INDEPENDENT_AMBULATORY_CARE_PROVIDER_SITE_OTHER): Payer: Self-pay | Admitting: Family Medicine

## 2020-01-20 ENCOUNTER — Ambulatory Visit (INDEPENDENT_AMBULATORY_CARE_PROVIDER_SITE_OTHER): Payer: BC Managed Care – PPO | Admitting: Family Medicine

## 2020-01-20 VITALS — BP 122/79 | HR 86 | Temp 98.1°F | Ht 73.0 in | Wt 282.0 lb

## 2020-01-20 DIAGNOSIS — E559 Vitamin D deficiency, unspecified: Secondary | ICD-10-CM

## 2020-01-20 DIAGNOSIS — Z9189 Other specified personal risk factors, not elsewhere classified: Secondary | ICD-10-CM | POA: Diagnosis not present

## 2020-01-20 DIAGNOSIS — Z6837 Body mass index (BMI) 37.0-37.9, adult: Secondary | ICD-10-CM

## 2020-01-20 DIAGNOSIS — I10 Essential (primary) hypertension: Secondary | ICD-10-CM | POA: Diagnosis not present

## 2020-01-20 DIAGNOSIS — E8881 Metabolic syndrome: Secondary | ICD-10-CM

## 2020-01-20 MED ORDER — LOSARTAN POTASSIUM 100 MG PO TABS: 100.0000 mg | ORAL_TABLET | Freq: Every day | ORAL | 0 refills | Status: DC

## 2020-01-20 NOTE — Progress Notes (Signed)
Chief Complaint:   OBESITY Edward Mccormick is here to discuss his progress with his obesity treatment plan along with follow-up of his obesity related diagnoses. Edward Mccormick is on the Category 3 Plan and states he is following his eating plan approximately 70% of the time. Edward Mccormick states he is walking/hunting for 5 hours 2 times per week.  Today's visit was #: 4 Starting weight: 295 lbs Starting date: 11/26/2019 Today's weight: 282 lbs Today's date: 01/20/2020 Total lbs lost to date: 13 lbs Total lbs lost since last in-office visit: 2 lbs  Interim History: Edward Mccormick is doing well.  He says that breakfast is monotonous but is working.  He is sabotaged when going out to eat.    Subjective:   1. Essential hypertension Review: taking medications as instructed, no medication side effects noted, no chest pain on exertion, no dyspnea on exertion, no swelling of ankles.  Edward Mccormick takes losartan for blood pressure control.  BP Readings from Last 3 Encounters:  01/20/20 122/79  01/02/20 113/85  12/10/19 104/70   2. Vitamin D deficiency Edward Mccormick Vitamin D level was 12.90 on 09/11/2019. He is currently taking vit D. He denies nausea, vomiting or muscle weakness.  3. Insulin resistance Edward Mccormick has a diagnosis of insulin resistance based on his elevated fasting insulin level >5. He continues to work on diet and exercise to decrease his risk of diabetes.  Lab Results  Component Value Date   INSULIN 12.8 11/26/2019   Lab Results  Component Value Date   HGBA1C 5.6 11/26/2019   4. At risk for heart disease Edward Mccormick is at a higher than average risk for cardiovascular disease due to obesity. Reviewed: no chest pain on exertion, no dyspnea on exertion, and no swelling of ankles.  Assessment/Plan:   1. Essential hypertension Edward Mccormick is working on healthy weight loss and exercise to improve blood pressure control. We will watch for signs of hypotension as he continues his lifestyle modifications.  Orders - losartan  (COZAAR) 100 MG tablet; Take 1 tablet (100 mg total) by mouth daily.  Dispense: 90 tablet; Refill: 0  2. Vitamin D deficiency Low Vitamin D level contributes to fatigue and are associated with obesity, breast, and colon cancer. He agrees to continue to take prescription Vitamin D @50 ,000 IU every week and will follow-up for routine testing of Vitamin D, at least 2-3 times per year to avoid over-replacement.  3. Insulin resistance Edward Mccormick will continue to work on weight loss, exercise, and decreasing simple carbohydrates to help decrease the risk of diabetes. Edward Mccormick agreed to follow-up with [place] as directed to closely monitor his progress.  4. At risk for heart disease Edward Mccormick was given approximately 15 minutes of coronary artery disease prevention counseling today. He is 59 y.o. male and has risk factors for heart disease including obesity. We discussed intensive lifestyle modifications today with an emphasis on specific weight loss instructions and strategies.   Repetitive spaced learning was employed today to elicit superior memory formation and behavioral change.  5. Class 2 severe obesity with serious comorbidity and body mass index (BMI) of 37.0 to 37.9 in adult, unspecified obesity type Edward Mccormick is currently in the action stage of change. As such, his goal is to continue with weight loss efforts. He has agreed to the Category 3 Plan.   Exercise goals: As is.  Behavioral modification strategies: increasing lean protein intake and increasing water intake.  Edward Mccormick has agreed to follow-up with our clinic in 2 weeks. He was informed of the  importance of frequent follow-up visits to maximize his success with intensive lifestyle modifications for his multiple health conditions.   Objective:   Blood pressure 122/79, pulse 86, temperature 98.1 F (36.7 C), temperature source Oral, height 6\' 1"  (1.854 m), weight 282 lb (127.9 kg), SpO2 100 %. Body mass index is 37.21 kg/m.  General: Cooperative,  alert, well developed, in no acute distress. HEENT: Conjunctivae and lids unremarkable. Cardiovascular: Regular rhythm.  Lungs: Normal work of breathing. Neurologic: No focal deficits.   Lab Results  Component Value Date   CREATININE 0.98 11/26/2019   BUN 9 11/26/2019   NA 142 11/26/2019   K 4.0 11/26/2019   CL 104 11/26/2019   CO2 25 11/26/2019   Lab Results  Component Value Date   ALT 21 11/26/2019   AST 22 11/26/2019   ALKPHOS 65 11/26/2019   BILITOT 0.3 11/26/2019   Lab Results  Component Value Date   HGBA1C 5.6 11/26/2019   Lab Results  Component Value Date   INSULIN 12.8 11/26/2019   Lab Results  Component Value Date   TSH 4.380 11/26/2019   Lab Results  Component Value Date   CHOL 215 (H) 11/26/2019   HDL 59 11/26/2019   LDLCALC 133 (H) 11/26/2019   LDLDIRECT 145.5 10/14/2011   TRIG 129 11/26/2019   CHOLHDL 4 09/11/2019   Lab Results  Component Value Date   WBC 6.0 11/26/2019   HGB 13.5 11/26/2019   HCT 40.9 11/26/2019   MCV 91 11/26/2019   PLT 172 11/26/2019   Attestation Statements:   Reviewed by clinician on day of visit: allergies, medications, problem list, medical history, surgical history, family history, social history, and previous encounter notes.  I, Water quality scientist, CMA, am acting as Location manager for [employer], DO.  I have reviewed the above documentation for accuracy and completeness, and I agree with the above. Briscoe Deutscher, DO

## 2020-01-22 ENCOUNTER — Encounter: Payer: Self-pay | Admitting: Family Medicine

## 2020-01-28 ENCOUNTER — Ambulatory Visit (INDEPENDENT_AMBULATORY_CARE_PROVIDER_SITE_OTHER): Payer: BC Managed Care – PPO | Admitting: Family Medicine

## 2020-02-12 ENCOUNTER — Other Ambulatory Visit: Payer: Self-pay

## 2020-02-12 ENCOUNTER — Ambulatory Visit (INDEPENDENT_AMBULATORY_CARE_PROVIDER_SITE_OTHER): Payer: BC Managed Care – PPO | Admitting: Family Medicine

## 2020-02-12 ENCOUNTER — Encounter (INDEPENDENT_AMBULATORY_CARE_PROVIDER_SITE_OTHER): Payer: Self-pay | Admitting: Family Medicine

## 2020-02-12 VITALS — BP 106/70 | HR 67 | Temp 98.7°F | Ht 73.0 in | Wt 279.0 lb

## 2020-02-12 DIAGNOSIS — Z9189 Other specified personal risk factors, not elsewhere classified: Secondary | ICD-10-CM

## 2020-02-12 DIAGNOSIS — E559 Vitamin D deficiency, unspecified: Secondary | ICD-10-CM | POA: Diagnosis not present

## 2020-02-12 DIAGNOSIS — I10 Essential (primary) hypertension: Secondary | ICD-10-CM | POA: Diagnosis not present

## 2020-02-12 DIAGNOSIS — E8881 Metabolic syndrome: Secondary | ICD-10-CM | POA: Diagnosis not present

## 2020-02-12 DIAGNOSIS — Z6836 Body mass index (BMI) 36.0-36.9, adult: Secondary | ICD-10-CM

## 2020-02-12 MED ORDER — LOSARTAN POTASSIUM 100 MG PO TABS: 100.0000 mg | ORAL_TABLET | Freq: Every day | ORAL | 0 refills | Status: DC

## 2020-02-12 NOTE — Progress Notes (Signed)
Chief Complaint:   OBESITY Edward Mccormick is here to discuss his progress with his obesity treatment plan along with follow-up of his obesity related diagnoses. Edward Mccormick is on the Category 3 Plan and states he is following his eating plan approximately 50% of the time. Edward Mccormick states he is exercising for 0 minutes 0 times per week.  Today's visit was #: 5 Starting weight: 295 lbs Starting date: 11/26/2019 Today's weight: 279 lbs Today's date: 02/12/2020 Total lbs lost to date: 16 lbs Total lbs lost since last in-office visit: 3 lbs  Interim History: Edward Mccormick went to [place] to visit his daughter.  He reports that he ate breakfast on plan, ate out for many other meals, but generally made good decisions.  Subjective:   1. Insulin resistance Edward Mccormick has a diagnosis of insulin resistance based on his elevated fasting insulin level >5. He continues to work on diet and exercise to decrease his risk of diabetes.  Lab Results  Component Value Date   INSULIN 12.8 11/26/2019   Lab Results  Component Value Date   HGBA1C 5.6 11/26/2019   2. Essential hypertension Review: taking medications as instructed, no medication side effects noted, no chest pain on exertion, no dyspnea on exertion, no swelling of ankles.  He is taking losartan 100 mg daily.  BP Readings from Last 3 Encounters:  02/12/20 106/70  01/20/20 122/79  01/02/20 113/85   3. Vitamin D deficiency Edward Mccormick's Vitamin D level was 34.5 on 11/26/2019. He is currently taking vit D. He denies nausea, vomiting or muscle weakness.  Assessment/Plan:   1. Insulin resistance Edward Mccormick will continue to work on weight loss, exercise, and decreasing simple carbohydrates to help decrease the risk of diabetes. Edward Mccormick agreed to follow-up with [place] as directed to closely monitor his progress.  2. Essential hypertension Zyrion is working on healthy weight loss and exercise to improve blood pressure control. We will watch for signs of hypotension as he continues his  lifestyle modifications.  Orders - losartan (COZAAR) 100 MG tablet; Take 1 tablet (100 mg total) by mouth daily.  Dispense: 90 tablet; Refill: 0  3. Vitamin D deficiency Low Vitamin D level contributes to fatigue and are associated with obesity, breast, and colon cancer. He agrees to continue to take prescription Vitamin D @50 ,000 IU every week and will follow-up for routine testing of Vitamin D, at least 2-3 times per year to avoid over-replacement.  4. At risk for heart disease Edward Mccormick was given approximately 15 minutes of coronary artery disease prevention counseling today. He is 59 y.o. male and has risk factors for heart disease including obesity. We discussed intensive lifestyle modifications today with an emphasis on specific weight loss instructions and strategies.   Repetitive spaced learning was employed today to elicit superior memory formation and behavioral change.  5. Class 2 severe obesity with serious comorbidity and body mass index (BMI) of 36.0 to 36.9 in adult, unspecified obesity type Edward Mccormick) Edward Mccormick is currently in the action stage of change. As such, his goal is to continue with weight loss efforts. He has agreed to the Category 3 Plan.   Exercise goals: Bike 2 times a week for 6 miles.  Behavioral modification strategies: increasing lean protein intake.  Leyton has agreed to follow-up with our clinic in 2 weeks. He was informed of the importance of frequent follow-up visits to maximize his success with intensive lifestyle modifications for his multiple health conditions.   Objective:   Blood pressure 106/70, pulse 67, temperature 98.7 F (  37.1 C), temperature source Oral, height 6\' 1"  (1.854 m), weight 279 lb (126.6 kg), SpO2 100 %. Body mass index is 36.81 kg/m.  General: Cooperative, alert, well developed, in no acute distress. HEENT: Conjunctivae and lids unremarkable. Cardiovascular: Regular rhythm.  Lungs: Normal work of breathing. Neurologic: No focal deficits.     Lab Results  Component Value Date   CREATININE 0.98 11/26/2019   BUN 9 11/26/2019   NA 142 11/26/2019   K 4.0 11/26/2019   CL 104 11/26/2019   CO2 25 11/26/2019   Lab Results  Component Value Date   ALT 21 11/26/2019   AST 22 11/26/2019   ALKPHOS 65 11/26/2019   BILITOT 0.3 11/26/2019   Lab Results  Component Value Date   HGBA1C 5.6 11/26/2019   Lab Results  Component Value Date   INSULIN 12.8 11/26/2019   Lab Results  Component Value Date   TSH 4.380 11/26/2019   Lab Results  Component Value Date   CHOL 215 (H) 11/26/2019   HDL 59 11/26/2019   LDLCALC 133 (H) 11/26/2019   LDLDIRECT 145.5 10/14/2011   TRIG 129 11/26/2019   CHOLHDL 4 09/11/2019   Lab Results  Component Value Date   WBC 6.0 11/26/2019   HGB 13.5 11/26/2019   HCT 40.9 11/26/2019   MCV 91 11/26/2019   PLT 172 11/26/2019   Attestation Statements:   Reviewed by clinician on day of visit: allergies, medications, problem list, medical history, surgical history, family history, social history, and previous encounter notes.  I, Water quality scientist, CMA, am acting as Location manager for [employer], DO.  I have reviewed the above documentation for accuracy and completeness, and I agree with the above. Briscoe Deutscher, DO

## 2020-02-26 ENCOUNTER — Encounter (INDEPENDENT_AMBULATORY_CARE_PROVIDER_SITE_OTHER): Payer: Self-pay | Admitting: Family Medicine

## 2020-02-26 ENCOUNTER — Ambulatory Visit (INDEPENDENT_AMBULATORY_CARE_PROVIDER_SITE_OTHER): Payer: BC Managed Care – PPO | Admitting: Family Medicine

## 2020-02-26 ENCOUNTER — Other Ambulatory Visit: Payer: Self-pay

## 2020-02-26 VITALS — BP 111/76 | HR 86 | Temp 98.5°F | Ht 73.0 in | Wt 280.0 lb

## 2020-02-26 DIAGNOSIS — E559 Vitamin D deficiency, unspecified: Secondary | ICD-10-CM | POA: Diagnosis not present

## 2020-02-26 DIAGNOSIS — I10 Essential (primary) hypertension: Secondary | ICD-10-CM | POA: Diagnosis not present

## 2020-02-26 DIAGNOSIS — E8881 Metabolic syndrome: Secondary | ICD-10-CM | POA: Diagnosis not present

## 2020-02-26 DIAGNOSIS — K0889 Other specified disorders of teeth and supporting structures: Secondary | ICD-10-CM | POA: Diagnosis not present

## 2020-02-26 DIAGNOSIS — Z6837 Body mass index (BMI) 37.0-37.9, adult: Secondary | ICD-10-CM

## 2020-02-26 NOTE — Progress Notes (Signed)
Chief Complaint:   OBESITY Edward Mccormick is here to discuss his progress with his obesity treatment plan along with follow-up of his obesity related diagnoses. Edward Mccormick is on the Category 3 Plan and states he is following his eating plan approximately 50% of the time. Edward Mccormick states he is exercising for 0 minutes 0 times per week.  Today's visit was #: 6 Starting weight: 295 lbs Starting date: 11/26/2019 Today's weight: 280 lbs Today's date: 02/26/2020 Total lbs lost to date: 15 lbs Total lbs lost since last in-office visit: 0  Interim History: Edward Mccormick has been under increased stress recently due to having dental surgery and also due to the death of a family member.  Subjective:   1. Vitamin D deficiency Edward Mccormick's Vitamin D level was 34.5 on 11/26/2019. He is currently taking prescription vitamin D 50,000 IU each week. He denies nausea, vomiting or muscle weakness.  2. Essential hypertension Review: taking medications as instructed, no medication side effects noted, no chest pain on exertion, no dyspnea on exertion, no swelling of ankles.  Edward Mccormick takes losartan for blood pressure.   BP Readings from Last 3 Encounters:  02/26/20 111/76  02/12/20 106/70  01/20/20 122/79   3. Insulin resistance Edward Mccormick has a diagnosis of insulin resistance based on his elevated fasting insulin level >5. He continues to work on diet and exercise to decrease his risk of diabetes.  Lab Results  Component Value Date   INSULIN 12.8 11/26/2019   Lab Results  Component Value Date   HGBA1C 5.6 11/26/2019   4. Edward Mccormick had to have an emergency root canal last week.  He is still taking Amoxicillin.  Assessment/Plan:   1. Vitamin D deficiency Low Vitamin D level contributes to fatigue and are associated with obesity, breast, and colon cancer. He agrees to continue to take prescription Vitamin D @50 ,000 IU every week and will follow-up for routine testing of Vitamin D, at least 2-3 times per year to avoid  over-replacement.  2. Essential hypertension Edward Mccormick is working on healthy weight loss and exercise to improve blood pressure control. We will watch for signs of hypotension as he continues his lifestyle modifications.  3. Insulin resistance Edward Mccormick will continue to work on weight loss, exercise, and decreasing simple carbohydrates to help decrease the risk of diabetes. Edward Mccormick agreed to follow-up with [place] as directed to closely monitor his progress.  4. Dentalgia Current treatment plan is effective, no change in therapy. Orders and follow up as documented in patient record.  5. Class 2 severe obesity with serious comorbidity and body mass index (BMI) of 37.0 to 37.9 in adult, unspecified obesity type Edward Mccormick) Edward Mccormick is currently in the action stage of change. As such, his goal is to continue with weight loss efforts. He has agreed to the Category 3 Plan.   Exercise goals: As is.  Behavioral modification strategies: increasing water intake and meal planning and cooking strategies.  Edward Mccormick has agreed to follow-up with our clinic in 2 weeks. He was informed of the importance of frequent follow-up visits to maximize his success with intensive lifestyle modifications for his multiple health conditions.   Objective:   Blood pressure 111/76, pulse 86, temperature 98.5 F (36.9 C), temperature source Oral, height 6\' 1"  (1.854 m), weight 280 lb (127 kg), SpO2 100 %. Body mass index is 36.94 kg/m.  General: Cooperative, alert, well developed, in no acute distress. HEENT: Conjunctivae and lids unremarkable. Cardiovascular: Regular rhythm.  Lungs: Normal work of breathing. Neurologic: No focal deficits.  Lab Results  Component Value Date   CREATININE 0.98 11/26/2019   BUN 9 11/26/2019   NA 142 11/26/2019   K 4.0 11/26/2019   CL 104 11/26/2019   CO2 25 11/26/2019   Lab Results  Component Value Date   ALT 21 11/26/2019   AST 22 11/26/2019   ALKPHOS 65 11/26/2019   BILITOT 0.3 11/26/2019    Lab Results  Component Value Date   HGBA1C 5.6 11/26/2019   Lab Results  Component Value Date   INSULIN 12.8 11/26/2019   Lab Results  Component Value Date   TSH 4.380 11/26/2019   Lab Results  Component Value Date   CHOL 215 (H) 11/26/2019   HDL 59 11/26/2019   LDLCALC 133 (H) 11/26/2019   LDLDIRECT 145.5 10/14/2011   TRIG 129 11/26/2019   CHOLHDL 4 09/11/2019   Lab Results  Component Value Date   WBC 6.0 11/26/2019   HGB 13.5 11/26/2019   HCT 40.9 11/26/2019   MCV 91 11/26/2019   PLT 172 11/26/2019   Attestation Statements:   Reviewed by clinician on day of visit: allergies, medications, problem list, medical history, surgical history, family history, social history, and previous encounter notes.  Time spent on visit including pre-visit chart review and post-visit care and documentation was 25 minutes. Time was spent on: preparing to see the patient (eg, review of tests), obtaining and/or reviewing separately obtained history, performing a medically necessary appropriate examination and/or evaluation, counseling and educating the patient/family/caregiver and documenting clinical information in the electronic or other health.    I, Water quality scientist, CMA, am acting as Location manager for [employer], DO.  I have reviewed the above documentation for accuracy and completeness, and I agree with the above. Briscoe Deutscher, DO

## 2020-03-24 ENCOUNTER — Ambulatory Visit (INDEPENDENT_AMBULATORY_CARE_PROVIDER_SITE_OTHER): Payer: BC Managed Care – PPO | Admitting: Family Medicine

## 2020-03-24 ENCOUNTER — Encounter (INDEPENDENT_AMBULATORY_CARE_PROVIDER_SITE_OTHER): Payer: Self-pay | Admitting: Family Medicine

## 2020-03-24 ENCOUNTER — Other Ambulatory Visit: Payer: Self-pay

## 2020-03-24 VITALS — BP 126/82 | HR 83 | Temp 98.8°F | Ht 73.0 in | Wt 272.0 lb

## 2020-03-24 DIAGNOSIS — E559 Vitamin D deficiency, unspecified: Secondary | ICD-10-CM

## 2020-03-24 DIAGNOSIS — I10 Essential (primary) hypertension: Secondary | ICD-10-CM | POA: Diagnosis not present

## 2020-03-24 DIAGNOSIS — Z6836 Body mass index (BMI) 36.0-36.9, adult: Secondary | ICD-10-CM

## 2020-03-24 DIAGNOSIS — Z9189 Other specified personal risk factors, not elsewhere classified: Secondary | ICD-10-CM | POA: Diagnosis not present

## 2020-03-24 NOTE — Progress Notes (Signed)
Chief Complaint:   OBESITY Edward Mccormick is here to discuss his progress with his obesity treatment plan along with follow-up of his obesity related diagnoses. [name] is on the Category 3 Plan and states he is following his eating plan approximately 70% of the time. [name] states he is biking/walking for 30 minutes 1 times per week.  Today's visit was #: 7 Starting weight: 295 lbs Starting date: 11/26/2019 Today's weight: 272 lbs Today's date: 03/24/2020 Total lbs lost to date: 23 lbs Total lbs lost since last in-office visit: 8 lbs  Interim History: Edward Mccormick is down 23 pounds today. He is still happy with the meal plan. No polyphagia.  Subjective:   1. Vitamin D deficiency Edward Mccormick's Vitamin D level was 34.5 on 11/26/2019. He is currently taking prescription vitamin D 50,000 IU each week. He denies nausea, vomiting or muscle weakness.  2. Essential hypertension Review: taking medications as instructed, no medication side effects noted, no chest pain on exertion, no dyspnea on exertion, no swelling of ankles.   BP Readings from Last 3 Encounters:  03/24/20 126/82  02/26/20 111/76  02/12/20 106/70   The 10-year ASCVD risk score Edward Mccormick DC Jr., et al., 2013) is: 11.3%   Values used to calculate the score:     Age: 76 years     Sex: Male     Is Non-Hispanic African American: Yes     Diabetic: No     Tobacco smoker: No     Systolic Blood Pressure: 123XX123 mmHg     Is BP treated: Yes     HDL Cholesterol: 59 mg/dL     Total Cholesterol: 215 mg/dL  Assessment/Plan:   1. Vitamin D deficiency Low Vitamin D level contributes to fatigue and are associated with obesity, breast, and colon cancer. He agrees to continue to take prescription Vitamin D @50 ,000 IU every week and will follow-up for routine testing of Vitamin D, at least 2-3 times per year to avoid over-replacement.  2. Essential hypertension Edward Mccormick is working on healthy weight loss and exercise to improve blood pressure control. We will watch  for signs of hypotension as he continues his lifestyle modifications.  3. At risk for heart disease Edward Mccormick was given approximately 15 minutes of coronary artery disease prevention counseling today. He is 59 y.o. male and has risk factors for heart disease including obesity. We discussed intensive lifestyle modifications today with an emphasis on specific weight loss instructions and strategies.   Repetitive spaced learning was employed today to elicit superior memory formation and behavioral change.  4. Class 2 severe obesity with serious comorbidity and body mass index (BMI) of 36.0 to 36.9 in adult, unspecified obesity type [facility]) Edward Mccormick is currently in the action stage of change. As such, his goal is to continue with weight loss efforts. He has agreed to the Category 3 Plan.   Exercise goals: 10 flights of stairs each week.  Behavioral modification strategies: increasing lean protein intake and increasing water intake.  [name] has agreed to follow-up with our clinic in 2 weeks. He was informed of the importance of frequent follow-up visits to maximize his success with intensive lifestyle modifications for his multiple health conditions.   Objective:   Blood pressure 126/82, pulse 83, temperature 98.8 F (37.1 C), temperature source Oral, height 6\' 1"  (1.854 m), weight 272 lb (123.4 kg), SpO2 100 %. Body mass index is 35.89 kg/m.  General: Cooperative, alert, well developed, in no acute distress. HEENT: Conjunctivae and lids unremarkable. Cardiovascular: Regular  rhythm.  Lungs: Normal work of breathing. Neurologic: No focal deficits.   Lab Results  Component Value Date   CREATININE 0.98 11/26/2019   BUN 9 11/26/2019   NA 142 11/26/2019   K 4.0 11/26/2019   CL 104 11/26/2019   CO2 25 11/26/2019   Lab Results  Component Value Date   ALT 21 11/26/2019   AST 22 11/26/2019   ALKPHOS 65 11/26/2019   BILITOT 0.3 11/26/2019   Lab Results  Component Value Date   HGBA1C 5.6  11/26/2019   Lab Results  Component Value Date   INSULIN 12.8 11/26/2019   Lab Results  Component Value Date   TSH 4.380 11/26/2019   Lab Results  Component Value Date   CHOL 215 (H) 11/26/2019   HDL 59 11/26/2019   LDLCALC 133 (H) 11/26/2019   LDLDIRECT 145.5 10/14/2011   TRIG 129 11/26/2019   CHOLHDL 4 09/11/2019   Lab Results  Component Value Date   WBC 6.0 11/26/2019   HGB 13.5 11/26/2019   HCT 40.9 11/26/2019   MCV 91 11/26/2019   PLT 172 11/26/2019   Attestation Statements:   Reviewed by clinician on day of visit: allergies, medications, problem list, medical history, surgical history, family history, social history, and previous encounter notes.  I, Water quality scientist, CMA, am acting as Location manager for [employer], DO.  I have reviewed the above documentation for accuracy and completeness, and I agree with the above. Briscoe Deutscher, DO

## 2020-04-15 ENCOUNTER — Ambulatory Visit (INDEPENDENT_AMBULATORY_CARE_PROVIDER_SITE_OTHER): Payer: BC Managed Care – PPO | Admitting: Family Medicine

## 2020-05-06 ENCOUNTER — Ambulatory Visit (INDEPENDENT_AMBULATORY_CARE_PROVIDER_SITE_OTHER): Payer: BC Managed Care – PPO | Admitting: Family Medicine

## 2020-05-13 ENCOUNTER — Ambulatory Visit (INDEPENDENT_AMBULATORY_CARE_PROVIDER_SITE_OTHER): Payer: BC Managed Care – PPO | Admitting: Family Medicine

## 2020-05-21 ENCOUNTER — Ambulatory Visit (INDEPENDENT_AMBULATORY_CARE_PROVIDER_SITE_OTHER): Payer: BC Managed Care – PPO | Admitting: Family Medicine

## 2020-05-21 ENCOUNTER — Other Ambulatory Visit: Payer: Self-pay

## 2020-05-21 ENCOUNTER — Encounter (INDEPENDENT_AMBULATORY_CARE_PROVIDER_SITE_OTHER): Payer: Self-pay | Admitting: Family Medicine

## 2020-05-21 VITALS — BP 123/81 | HR 67 | Temp 98.5°F | Ht 73.0 in | Wt 272.0 lb

## 2020-05-21 DIAGNOSIS — Z6836 Body mass index (BMI) 36.0-36.9, adult: Secondary | ICD-10-CM

## 2020-05-21 DIAGNOSIS — E559 Vitamin D deficiency, unspecified: Secondary | ICD-10-CM

## 2020-05-21 DIAGNOSIS — Z9189 Other specified personal risk factors, not elsewhere classified: Secondary | ICD-10-CM

## 2020-05-21 DIAGNOSIS — I10 Essential (primary) hypertension: Secondary | ICD-10-CM | POA: Diagnosis not present

## 2020-05-21 MED ORDER — VITAMIN D3 1.25 MG (50000 UT) PO CAPS: 1.0000 | ORAL_CAPSULE | ORAL | 0 refills | Status: DC

## 2020-05-21 NOTE — Progress Notes (Signed)
Chief Complaint:   OBESITY Edward Mccormick is here to discuss his progress with his obesity treatment plan along with follow-up of his obesity related diagnoses. Edward Mccormick is on the Category 3 Plan and states he is following his eating plan approximately 50% of the time. Edward Mccormick states he is doing cardio and walking for 30-45 minutes 2 times per week.  Today's visit was #: 8 Starting weight: 295 lbs Starting date: 11/26/2019 Today's weight: 272 lbs Today's date: 05/21/2020 Total lbs lost to date: 23 lbs Total lbs lost since last in-office visit: 0  Interim History: Edward Mccormick' bioimpedence comparison from last visit shows that he is up 5 pounds of muscle and down 5 pounds of fat.  Subjective:   1. Essential hypertension Review: taking medications as instructed, no medication side effects noted, no chest pain on exertion, no dyspnea on exertion, no swelling of ankles.   He has been off medication for 1 week and his blood pressure is still at goal.  BP Readings from Last 3 Encounters:  05/21/20 123/81  03/24/20 126/82  02/26/20 111/76   2. Vitamin D deficiency Edward Mccormick's Vitamin D level was 34.5 on 11/26/2019. He is currently taking prescription vitamin D 50,000 IU each week. He denies nausea, vomiting or muscle weakness.  3. At risk for heart disease  The 10-year ASCVD risk score Edward Bussing DC Brooke Bonito., et al., 2013) is: 11.2%   Values used to calculate the score:     Age: 59 years     Sex: Male     Is Non-Hispanic African American: Yes     Diabetic: No     Tobacco smoker: No     Systolic Blood Pressure: 696 mmHg     Is BP treated: Yes     HDL Cholesterol: 59 mg/dL     Total Cholesterol: 215 mg/dL  Assessment/Plan:   1. Essential hypertension Edward Mccormick is working on healthy weight loss and exercise to improve blood pressure control. We will watch for signs of hypotension as he continues his lifestyle modifications.  Will monitor symptoms.  2. Vitamin D deficiency Low Vitamin D level contributes to fatigue  and are associated with obesity, breast, and colon cancer. He agrees to continue to take prescription Vitamin D @50 ,000 IU every week and will follow-up for routine testing of Vitamin D, at least 2-3 times per year to avoid over-replacement.  Orders - Cholecalciferol (VITAMIN D3) 1.25 MG (50000 UT) CAPS; Take 1 capsule by mouth once a week.  Dispense: 12 capsule; Refill: 0  3. At risk for heart disease Edward Mccormick was given approximately 15 minutes of coronary artery disease prevention counseling today. He is 59 y.o. male and has risk factors for heart disease including obesity. We discussed intensive lifestyle modifications today with an emphasis on specific weight loss instructions and strategies.   Repetitive spaced learning was employed today to elicit superior memory formation and behavioral change.  4. Class 2 severe obesity with serious comorbidity and body mass index (BMI) of 36.0 to 36.9 in adult, unspecified obesity type Edward Mccormick is currently in the action stage of change. As such, his goal is to continue with weight loss efforts. He has agreed to the Category 3 Plan.   Exercise goals: For substantial health benefits, adults should do at least 150 minutes (2 hours and 30 minutes) a week of moderate-intensity, or 75 minutes (1 hour and 15 minutes) a week of vigorous-intensity aerobic physical activity, or an equivalent combination of moderate- and vigorous-intensity aerobic activity. Aerobic activity  should be performed in episodes of at least 10 minutes, and preferably, it should be spread throughout the week.  Behavioral modification strategies: increasing lean protein intake and increasing water intake.  Edward Mccormick has agreed to follow-up with our clinic in 3-4 weeks. He was informed of the importance of frequent follow-up visits to maximize his success with intensive lifestyle modifications for his multiple health conditions.   Objective:   Blood pressure 123/81, pulse 67, temperature 98.5  F (36.9 C), temperature source Oral, height 6\' 1"  (1.854 m), weight 272 lb (123.4 kg), SpO2 100 %. Body mass index is 35.89 kg/m.  General: Cooperative, alert, well developed, in no acute distress. HEENT: Conjunctivae and lids unremarkable. Cardiovascular: Regular rhythm.  Lungs: Normal work of breathing. Neurologic: No focal deficits.   Lab Results  Component Value Date   CREATININE 0.98 11/26/2019   BUN 9 11/26/2019   NA 142 11/26/2019   K 4.0 11/26/2019   CL 104 11/26/2019   CO2 25 11/26/2019   Lab Results  Component Value Date   ALT 21 11/26/2019   AST 22 11/26/2019   ALKPHOS 65 11/26/2019   BILITOT 0.3 11/26/2019   Lab Results  Component Value Date   HGBA1C 5.6 11/26/2019   Lab Results  Component Value Date   INSULIN 12.8 11/26/2019   Lab Results  Component Value Date   TSH 4.380 11/26/2019   Lab Results  Component Value Date   CHOL 215 (H) 11/26/2019   HDL 59 11/26/2019   LDLCALC 133 (H) 11/26/2019   LDLDIRECT 145.5 10/14/2011   TRIG 129 11/26/2019   CHOLHDL 4 09/11/2019   Lab Results  Component Value Date   WBC 6.0 11/26/2019   HGB 13.5 11/26/2019   HCT 40.9 11/26/2019   MCV 91 11/26/2019   PLT 172 11/26/2019   Attestation Statements:   Reviewed by clinician on day of visit: allergies, medications, problem list, medical history, surgical history, family history, social history, and previous encounter notes.  I, Water quality scientist, CMA, am acting as transcriptionist for Briscoe Deutscher, DO  I have reviewed the above documentation for accuracy and completeness, and I agree with the above. Briscoe Deutscher, DO

## 2020-06-17 ENCOUNTER — Ambulatory Visit (INDEPENDENT_AMBULATORY_CARE_PROVIDER_SITE_OTHER): Payer: BC Managed Care – PPO | Admitting: Family Medicine

## 2020-06-30 ENCOUNTER — Other Ambulatory Visit: Payer: Self-pay

## 2020-06-30 ENCOUNTER — Encounter (INDEPENDENT_AMBULATORY_CARE_PROVIDER_SITE_OTHER): Payer: Self-pay | Admitting: Adult Health

## 2020-06-30 ENCOUNTER — Ambulatory Visit (INDEPENDENT_AMBULATORY_CARE_PROVIDER_SITE_OTHER): Payer: BC Managed Care – PPO | Admitting: Adult Health

## 2020-06-30 VITALS — BP 128/86 | HR 66 | Temp 98.1°F | Ht 73.0 in | Wt 272.0 lb

## 2020-06-30 DIAGNOSIS — E782 Mixed hyperlipidemia: Secondary | ICD-10-CM

## 2020-06-30 DIAGNOSIS — I10 Essential (primary) hypertension: Secondary | ICD-10-CM

## 2020-06-30 DIAGNOSIS — Z9189 Other specified personal risk factors, not elsewhere classified: Secondary | ICD-10-CM

## 2020-06-30 DIAGNOSIS — Z6836 Body mass index (BMI) 36.0-36.9, adult: Secondary | ICD-10-CM

## 2020-06-30 DIAGNOSIS — E559 Vitamin D deficiency, unspecified: Secondary | ICD-10-CM

## 2020-06-30 DIAGNOSIS — E88819 Insulin resistance, unspecified: Secondary | ICD-10-CM

## 2020-06-30 DIAGNOSIS — E8881 Metabolic syndrome: Secondary | ICD-10-CM | POA: Diagnosis not present

## 2020-06-30 DIAGNOSIS — E66812 Obesity, class 2: Secondary | ICD-10-CM

## 2020-06-30 DIAGNOSIS — E7849 Other hyperlipidemia: Secondary | ICD-10-CM | POA: Insufficient documentation

## 2020-06-30 NOTE — Progress Notes (Signed)
Chief Complaint:   OBESITY Edward Mccormick is here to discuss his progress with his obesity treatment plan along with follow-up of his obesity related diagnoses. [name] is on the Category 3 Plan and states he is following his eating plan approximately 60% of the time. [name] states he is exercising 0 minutes 0 times per week.  Today's visit was #: 9 Starting weight: 295 lbs Starting date: 11/26/2019 Today's weight: 272 lbs Today's date: 06/30/2020 Total lbs lost to date: 23 Total lbs lost since last in-office visit: 0  Interim History: Edward Mccormick has started to bore on the Category 3 meal plan and is frustrated with weight loss plateau. He reports occasionally skipping meals and denies excessive cravings. He would like to discuss other meal plan options.  Subjective:   Essential hypertension. Blood pressure is at goal at today's office visit. He stopped losartan more than 2 months ago due to blood pressure normalizing.  BP Readings from Last 3 Encounters:  06/30/20 128/86  05/21/20 123/81  03/24/20 126/82   Lab Results  Component Value Date   CREATININE 0.98 11/26/2019   CREATININE 1.17 09/11/2019   CREATININE 1.11 09/04/2018   Insulin resistance. Edward Mccormick has a diagnosis of insulin resistance based on his elevated fasting insulin level >5. He continues to work on diet and exercise to decrease his risk of diabetes. He is not on metformin and denies polyphagia.  Lab Results  Component Value Date   INSULIN 12.8 11/26/2019   Lab Results  Component Value Date   HGBA1C 5.6 11/26/2019   Mixed hyperlipidemia. Edward Mccormick is not on statin therapy.  Lab Results  Component Value Date   CHOL 215 (H) 11/26/2019   HDL 59 11/26/2019   LDLCALC 133 (H) 11/26/2019   LDLDIRECT 145.5 10/14/2011   TRIG 129 11/26/2019   CHOLHDL 4 09/11/2019   Lab Results  Component Value Date   ALT 21 11/26/2019   AST 22 11/26/2019   ALKPHOS 65 11/26/2019   BILITOT 0.3 11/26/2019   The 10-year ASCVD risk  score Edward Bussing DC Jr., et al., 2013) is: 7.4%   Values used to calculate the score:     Age: 59 years     Sex: Male     Is Non-Hispanic African American: Yes     Diabetic: No     Tobacco smoker: No     Systolic Blood Pressure: 315 mmHg     Is BP treated: No     HDL Cholesterol: 59 mg/dL     Total Cholesterol: 215 mg/dL  Vitamin D deficiency. Edward Mccormick is on Ergocalciferol. No nausea, vomiting, or muscle weakness.    Ref. Range 11/26/2019 11:34  Vitamin D, 25-Hydroxy Latest Ref Range: 30.0 - 100.0 ng/mL 34.5   At risk for heart disease. Edward Mccormick is at a higher than average risk for cardiovascular disease due to hyperlipidemia and obesity.   Assessment/Plan:   Essential hypertension. [name] is working on healthy weight loss and exercise to improve blood pressure control. We will watch for signs of hypotension as he continues his lifestyle modifications. Labs will be checked today. Will closely monitor blood pressure.   Insulin resistance. [name] will continue to work on weight loss, exercise, and decreasing simple carbohydrates to help decrease the risk of diabetes. Edward Mccormick agreed to follow-up with [place] as directed to closely monitor his progress. Labs will be checked today.   Mixed hyperlipidemia. Cardiovascular risk and specific lipid/LDL goals reviewed.  We discussed several lifestyle modifications today and Edward Mccormick will continue  to work on diet, exercise and weight loss efforts. Orders and follow up as documented in patient record. Labs will be checked today.  Counseling Intensive lifestyle modifications are the first line treatment for this issue. . Dietary changes: Increase soluble fiber. Decrease simple carbohydrates. . Exercise changes: Moderate to vigorous-intensity aerobic activity 150 minutes per week if tolerated. . Lipid-lowering medications: see documented in medical record.   Vitamin D deficiency. Low Vitamin D level contributes to fatigue and are associated with obesity, breast, and colon  cancer. He agrees to continue to take prescription Ergocalciferol as directed and VITAMIN D 25 Hydroxy (Vit-D Deficiency, Fractures) level will be checked today.  At risk for heart disease. Edward Mccormick was given approximately 15 minutes of coronary artery disease prevention counseling today. He is 59 y.o. male and has risk factors for heart disease including obesity. We discussed intensive lifestyle modifications today with an emphasis on specific weight loss instructions and strategies.   Repetitive spaced learning was employed today to elicit superior memory formation and behavioral change.  Class 2 severe obesity with serious comorbidity and body mass index (BMI) of 36.0 to 36.9 in adult, unspecified obesity type ([place]).  Edward Mccormick is currently in the action stage of change. As such, his goal is to continue with weight loss efforts. He has agreed to changing plans and will now keep a daily food journal and adhering to recommended goals of 1400-1500 calories and 100 grams of protein daily.   Exercise goals: No exercise has been prescribed at this time.  Behavioral modification strategies: increasing lean protein intake, decreasing simple carbohydrates, increasing water intake, no skipping meals, meal planning and cooking strategies, planning for success and keeping a strict food journal.  Edward Mccormick has agreed to follow-up with our clinic in 2-3 weeks. He was informed of the importance of frequent follow-up visits to maximize his success with intensive lifestyle modifications for his multiple health conditions.   Edward Mccormick was informed we would discuss his lab results at his next visit unless there is a critical issue that needs to be addressed sooner. Edward Mccormick agreed to keep his next visit at the agreed upon time to discuss these results.  Objective:   Blood pressure 128/86, pulse 66, temperature 98.1 F (36.7 C), temperature source Oral, height 6\' 1"  (1.854 m), weight 272 lb (123.4 kg), SpO2 100 %. Body mass  index is 35.89 kg/m.  General: Cooperative, alert, well developed, in no acute distress. HEENT: Conjunctivae and lids unremarkable. Cardiovascular: Regular rhythm.  Lungs: Normal work of breathing. Neurologic: No focal deficits.   Lab Results  Component Value Date   CREATININE 0.98 11/26/2019   BUN 9 11/26/2019   NA 142 11/26/2019   K 4.0 11/26/2019   CL 104 11/26/2019   CO2 25 11/26/2019   Lab Results  Component Value Date   ALT 21 11/26/2019   AST 22 11/26/2019   ALKPHOS 65 11/26/2019   BILITOT 0.3 11/26/2019   Lab Results  Component Value Date   HGBA1C 5.6 11/26/2019   Lab Results  Component Value Date   INSULIN 12.8 11/26/2019   Lab Results  Component Value Date   TSH 4.380 11/26/2019   Lab Results  Component Value Date   CHOL 215 (H) 11/26/2019   HDL 59 11/26/2019   LDLCALC 133 (H) 11/26/2019   LDLDIRECT 145.5 10/14/2011   TRIG 129 11/26/2019   CHOLHDL 4 09/11/2019   Lab Results  Component Value Date   WBC 6.0 11/26/2019   HGB 13.5 11/26/2019  HCT 40.9 11/26/2019   MCV 91 11/26/2019   PLT 172 11/26/2019   No results found for: IRON, TIBC, FERRITIN  Attestation Statements:   Reviewed by clinician on day of visit: allergies, medications, problem list, medical history, surgical history, family history, social history, and previous encounter notes.  I, Michaelene Song, am acting as Location manager for [employer], NP-C   I have reviewed the above documentation for accuracy and completeness, and I agree with the above. -  Esaw Grandchild, NP

## 2020-07-01 LAB — COMPREHENSIVE METABOLIC PANEL
ALT: 17 IU/L (ref 0–44)
AST: 21 IU/L (ref 0–40)
Albumin/Globulin Ratio: 1.8 (ref 1.2–2.2)
Albumin: 4.1 g/dL (ref 3.8–4.9)
Alkaline Phosphatase: 54 IU/L (ref 48–121)
BUN/Creatinine Ratio: 8 — ABNORMAL LOW (ref 9–20)
BUN: 9 mg/dL (ref 6–24)
Bilirubin Total: 0.6 mg/dL (ref 0.0–1.2)
CO2: 27 mmol/L (ref 20–29)
Calcium: 9 mg/dL (ref 8.7–10.2)
Chloride: 105 mmol/L (ref 96–106)
Creatinine, Ser: 1.19 mg/dL (ref 0.76–1.27)
GFR calc Af Amer: 77 mL/min/{1.73_m2} (ref >59)
GFR calc non Af Amer: 67 mL/min/{1.73_m2} (ref >59)
Globulin, Total: 2.3 g/dL (ref 1.5–4.5)
Glucose: 89 mg/dL (ref 65–99)
Potassium: 4.2 mmol/L (ref 3.5–5.2)
Sodium: 142 mmol/L (ref 134–144)
Total Protein: 6.4 g/dL (ref 6.0–8.5)

## 2020-07-01 LAB — INSULIN, RANDOM: INSULIN: 11 u[IU]/mL (ref 2.6–24.9)

## 2020-07-01 LAB — LIPID PANEL WITH LDL/HDL RATIO
Cholesterol, Total: 186 mg/dL (ref 100–199)
HDL: 55 mg/dL (ref >39)
LDL Chol Calc (NIH): 118 mg/dL — ABNORMAL HIGH (ref 0–99)
LDL/HDL Ratio: 2.1 ratio (ref 0.0–3.6)
Triglycerides: 67 mg/dL (ref 0–149)
VLDL Cholesterol Cal: 13 mg/dL (ref 5–40)

## 2020-07-01 LAB — HEMOGLOBIN A1C
Est. average glucose Bld gHb Est-mCnc: 117 mg/dL
Hgb A1c MFr Bld: 5.7 % — ABNORMAL HIGH (ref 4.8–5.6)

## 2020-07-01 LAB — VITAMIN D 25 HYDROXY (VIT D DEFICIENCY, FRACTURES): Vit D, 25-Hydroxy: 35.5 ng/mL (ref 30.0–100.0)

## 2020-07-22 ENCOUNTER — Encounter (INDEPENDENT_AMBULATORY_CARE_PROVIDER_SITE_OTHER): Payer: Self-pay | Admitting: Adult Health

## 2020-07-22 ENCOUNTER — Ambulatory Visit (INDEPENDENT_AMBULATORY_CARE_PROVIDER_SITE_OTHER): Payer: BC Managed Care – PPO | Admitting: Adult Health

## 2020-07-22 ENCOUNTER — Other Ambulatory Visit: Payer: Self-pay

## 2020-07-22 VITALS — BP 120/79 | HR 79 | Temp 98.2°F | Ht 73.0 in | Wt 272.0 lb

## 2020-07-22 DIAGNOSIS — E559 Vitamin D deficiency, unspecified: Secondary | ICD-10-CM | POA: Diagnosis not present

## 2020-07-22 DIAGNOSIS — Z6836 Body mass index (BMI) 36.0-36.9, adult: Secondary | ICD-10-CM

## 2020-07-22 DIAGNOSIS — E782 Mixed hyperlipidemia: Secondary | ICD-10-CM

## 2020-07-22 DIAGNOSIS — Z9189 Other specified personal risk factors, not elsewhere classified: Secondary | ICD-10-CM

## 2020-07-22 DIAGNOSIS — I10 Essential (primary) hypertension: Secondary | ICD-10-CM | POA: Diagnosis not present

## 2020-07-22 DIAGNOSIS — R7303 Prediabetes: Secondary | ICD-10-CM | POA: Diagnosis not present

## 2020-07-22 MED ORDER — VITAMIN D3 1.25 MG (50000 UT) PO CAPS: 1.0000 | ORAL_CAPSULE | ORAL | 0 refills | Status: DC

## 2020-07-22 NOTE — Telephone Encounter (Signed)
fyi

## 2020-07-22 NOTE — Progress Notes (Signed)
Chief Complaint:   OBESITY [name] is here to discuss his progress with his obesity treatment plan along with follow-up of his obesity related diagnoses. Ason is on the Category 3 Plan. Krishiv states he is exercising 0 minutes 0 times per week.  Today's visit was #: 10 Starting weight: 295 lbs Starting date: 11/26/2019 Today's weight: 272 lbs Today's date: 07/22/2020 Total lbs lost to date: 23 Total lbs lost since last in-office visit: 0  Interim History: At Baley' last office visit, he was changed from a Category meal plan to a journaling plan due to weight loss plateau. He has not been following either plan, but following more of a PC/[place]. He would like to take a brief pause from regular office visits and focus on healthy eating the next 6 weeks.  Subjective:   Vitamin D deficiency.  Hiroshi is on Ergocalciferol. No nausea, vomiting, or muscle weakness. Vitamin D level is still below a goal of 50. Labs were discussed with the patient today.    Ref. Range 06/30/2020 12:39  Vitamin D, 25-Hydroxy Latest Ref Range: 30.0 - 100.0 ng/mL 35.5   Prediabetes. Vivaan has a diagnosis of prediabetes based on his elevated HgA1c and was informed this puts him at greater risk of developing diabetes. He continues to work on diet and exercise to decrease his risk of diabetes. He denies nausea or hypoglycemia. Blood glucose level is stable and insulin level is improved. A1c increased from 5.6 to 5.7. CMP is stable. Josuel is not on metformin. Labs were discussed with the patient today.   Lab Results  Component Value Date   HGBA1C 5.7 (H) 06/30/2020   Lab Results  Component Value Date   INSULIN 11.0 06/30/2020   INSULIN 12.8 11/26/2019   Mixed hyperlipidemia. Total cholesterol is now normalized. LDL is improved, however, still above goal. Jayzon is not on a statin. Labs were discussed with the patient today.   Lab Results  Component Value Date   CHOL 186 06/30/2020   HDL 55 06/30/2020    LDLCALC 118 (H) 06/30/2020   LDLDIRECT 145.5 10/14/2011   TRIG 67 06/30/2020   CHOLHDL 4 09/11/2019   Lab Results  Component Value Date   ALT 17 06/30/2020   AST 21 06/30/2020   ALKPHOS 54 06/30/2020   BILITOT 0.6 06/30/2020   The 10-year ASCVD risk score [name], et al., 2013) is: 6.4%   Values used to calculate the score:     Age: 59 years     Sex: Male     Is Non-Hispanic African American: Yes     Diabetic: No     Tobacco smoker: No     Systolic Blood Pressure: 458 mmHg     Is BP treated: No     HDL Cholesterol: 55 mg/dL     Total Cholesterol: 186 mg/dL  Essential hypertension. Blood pressure is at goal today. CMP is stable. Elior was previously on losartan and has been off of ARB for greater than 2 months. Blood pressure is diet controlled. Labs were discussed with the patient today.   BP Readings from Last 3 Encounters:  07/22/20 120/79  06/30/20 128/86  05/21/20 123/81   Lab Results  Component Value Date   CREATININE 1.19 06/30/2020   CREATININE 0.98 11/26/2019   CREATININE 1.17 09/11/2019   At risk for diabetes mellitus. Srihan is at higher than average risk for developing diabetes due to elevated A1c and obesity.   Assessment/Plan:   Vitamin  D deficiency. Low Vitamin D level contributes to fatigue and are associated with obesity, breast, and colon cancer. He was given a refill on his Cholecalciferol (VITAMIN D3) 1.25 MG (50000 UT) CAPS every week #6 with 0 refills and will follow-up for routine testing of Vitamin D, at least 2-3 times per year to avoid over-replacement.   Prediabetes. Laken will continue to work on weight loss, exercise, and decreasing simple carbohydrates to help decrease the risk of diabetes. He will increase his protein intake, limit sugar, and increase regular exercise. Labs will be checked every 3 months.   Mixed hyperlipidemia. Cardiovascular risk and specific lipid/LDL goals reviewed.  We discussed several lifestyle modifications today  and Bill will continue to work on diet, exercise and weight loss efforts. Orders and follow up as documented in patient record. Dorwin will continue to limit his intake of saturated fats. Labs will be checked every 3 months.   Counseling Intensive lifestyle modifications are the first line treatment for this issue. . Dietary changes: Increase soluble fiber. Decrease simple carbohydrates. . Exercise changes: Moderate to vigorous-intensity aerobic activity 150 minutes per week if tolerated. . Lipid-lowering medications: see documented in medical record.  Essential hypertension. Jene is working on healthy weight loss and exercise to improve blood pressure control. We will watch for signs of hypotension as he continues his lifestyle modifications. He will continue healthy eating and regular exercise.  At risk for diabetes mellitus. Teandre was given approximately 15 minutes of diabetes education and counseling today. We discussed intensive lifestyle modifications today with an emphasis on weight loss as well as increasing exercise and decreasing simple carbohydrates in his diet. We also reviewed medication options with an emphasis on risk versus benefit of those discussed.   Repetitive spaced learning was employed today to elicit superior memory formation and behavioral change.  Class 2 severe obesity with serious comorbidity and body mass index (BMI) of 36.0 to 36.9 in adult, unspecified obesity type ([place]).  Brayant is currently in the action stage of change. As such, his goal is to continue with weight loss efforts. He has agreed to a hybrid of the Category 3 Plan and PC/[place] daily.   Exercise goals: No exercise has been prescribed at this time.  Behavioral modification strategies: increasing lean protein intake, decreasing simple carbohydrates, increasing water intake, meal planning and cooking strategies, planning for success and keeping a strict food journal.    Colum has agreed to follow-up with  our clinic in 6 weeks. He was informed of the importance of frequent follow-up visits to maximize his success with intensive lifestyle modifications for his multiple health conditions.   Since he would like to delay return to office for 6 weeks, we discussed that he will need to hold himself accountable to a meal plan and adequate hydration, and increasing daily walking/exercise.  Objective:   Blood pressure 120/79, pulse 79, temperature 98.2 F (36.8 C), temperature source Oral, height 6\' 1"  (1.854 m), weight 272 lb (123.4 kg), SpO2 99 %. Body mass index is 35.89 kg/m.  General: Cooperative, alert, well developed, in no acute distress. HEENT: Conjunctivae and lids unremarkable. Cardiovascular: Regular rhythm.  Lungs: Normal work of breathing. Neurologic: No focal deficits.   Lab Results  Component Value Date   CREATININE 1.19 06/30/2020   BUN 9 06/30/2020   NA 142 06/30/2020   K 4.2 06/30/2020   CL 105 06/30/2020   CO2 27 06/30/2020   Lab Results  Component Value Date   ALT 17 06/30/2020  AST 21 06/30/2020   ALKPHOS 54 06/30/2020   BILITOT 0.6 06/30/2020   Lab Results  Component Value Date   HGBA1C 5.7 (H) 06/30/2020   HGBA1C 5.6 11/26/2019   Lab Results  Component Value Date   INSULIN 11.0 06/30/2020   INSULIN 12.8 11/26/2019   Lab Results  Component Value Date   TSH 4.380 11/26/2019   Lab Results  Component Value Date   CHOL 186 06/30/2020   HDL 55 06/30/2020   LDLCALC 118 (H) 06/30/2020   LDLDIRECT 145.5 10/14/2011   TRIG 67 06/30/2020   CHOLHDL 4 09/11/2019   Lab Results  Component Value Date   WBC 6.0 11/26/2019   HGB 13.5 11/26/2019   HCT 40.9 11/26/2019   MCV 91 11/26/2019   PLT 172 11/26/2019   No results found for: IRON, TIBC, FERRITIN  Attestation Statements:   Reviewed by clinician on day of visit: allergies, medications, problem list, medical history, surgical history, family history, social history, and previous encounter notes.  I,  Michaelene Song, am acting as Location manager for [employer], NP-C   I have reviewed the above documentation for accuracy and completeness, and I agree with the above. -  Esaw Grandchild, NP

## 2020-09-02 ENCOUNTER — Other Ambulatory Visit: Payer: Self-pay

## 2020-09-02 ENCOUNTER — Encounter (INDEPENDENT_AMBULATORY_CARE_PROVIDER_SITE_OTHER): Payer: Self-pay | Admitting: Adult Health

## 2020-09-02 ENCOUNTER — Ambulatory Visit (INDEPENDENT_AMBULATORY_CARE_PROVIDER_SITE_OTHER): Payer: BC Managed Care – PPO | Admitting: Adult Health

## 2020-09-02 VITALS — BP 148/79 | HR 73 | Temp 98.2°F | Ht 73.0 in | Wt 272.0 lb

## 2020-09-02 DIAGNOSIS — Z6836 Body mass index (BMI) 36.0-36.9, adult: Secondary | ICD-10-CM

## 2020-09-02 DIAGNOSIS — Z9189 Other specified personal risk factors, not elsewhere classified: Secondary | ICD-10-CM

## 2020-09-02 DIAGNOSIS — E559 Vitamin D deficiency, unspecified: Secondary | ICD-10-CM

## 2020-09-02 DIAGNOSIS — I10 Essential (primary) hypertension: Secondary | ICD-10-CM

## 2020-09-02 DIAGNOSIS — E66812 Obesity, class 2: Secondary | ICD-10-CM

## 2020-09-02 MED ORDER — VITAMIN D3 1.25 MG (50000 UT) PO CAPS: 1.0000 | ORAL_CAPSULE | ORAL | 0 refills | Status: DC

## 2020-09-02 NOTE — Progress Notes (Signed)
Chief Complaint:   OBESITY Edward Mccormick is here to discuss his progress with his obesity treatment plan along with follow-up of his obesity related diagnoses. Edward Mccormick is on the Category 3 Plan and states he is following his eating plan approximately 60% of the time. Edward Mccormick states he is exercising 0 minutes 0 times per week.  Today's visit was #: 11 Starting weight: 295 lbs Starting date: 11/26/2019 Today's weight: 272 lbs Today's date: 09/02/2020 Total lbs lost to date: 23 Total lbs lost since last in-office visit: 0  Interim History: Edward Mccormick purchased a single story home for his mother/mother-in-law. He has been planning/prepping for the move the last 3 weeks. He and his wife are traveling to [place] this weekend to celebrate their 2th wedding anniversary and he plans on biking/walking frequently while on vacation. He would like to convert to PC/ the next 6 weeks.  Subjective:   Vitamin D deficiency. Vitamin D level on 06/30/2020 was 35.5. Bascom is on Ergocalciferol. No nausea, vomiting, or muscle weakness.    Ref. Range 06/30/2020 12:39  Vitamin D, 25-Hydroxy Latest Ref Range: 30.0 - 100.0 ng/mL 35.5   Essential hypertension. Systolic blood pressure is elevated at today's office visit - 148/79 - with a heart rate of 73. Tashon was previously on losartan 50 mg, but discontinued due to symptomatic hypotension.  BP Readings from Last 3 Encounters:  09/02/20 (!) 148/79  07/22/20 120/79  06/30/20 128/86   Lab Results  Component Value Date   CREATININE 1.19 06/30/2020   CREATININE 0.98 11/26/2019   CREATININE 1.17 09/11/2019   At risk for heart disease. Zygmund is at a higher than average risk for cardiovascular disease due to hypertension (was previously on ARB that was discontinued due to symptomatic hypotension), and obesity.   Assessment/Plan:   Vitamin D deficiency. Low Vitamin D level contributes to fatigue and are associated with obesity, breast, and colon cancer. He  was given a refill on his Cholecalciferol (VITAMIN D3) 1.25 MG (50000 UT) CAPS every week #4 with 0 refills and will follow-up for routine testing of Vitamin D, at least 2-3 times per year to avoid over-replacement.   Essential hypertension. Edward Mccormick is working on healthy weight loss and exercise to improve blood pressure control. We will watch for signs of hypotension as he continues his lifestyle modifications. He will check blood pressure and heart rate daily and will send a MyChart message on 09/16/2020 with readings. If blood pressure is above goal, will restart losartan at 25 mg daily.  At risk for heart disease. Edward Mccormick was given approximately 15 minutes of coronary artery disease prevention counseling today. He is 59 y.o. male and has risk factors for heart disease including obesity. We discussed intensive lifestyle modifications today with an emphasis on specific weight loss instructions and strategies. Edward Mccormick will monitor blood pressure and will restart losartan if blood pressure is above goal.  Repetitive spaced learning was employed today to elicit superior memory formation and behavioral change.  Class 2 severe obesity with serious comorbidity and body mass index (BMI) of 36.0 to 36.9 in adult, unspecified obesity type ([place]).  [name] is currently in the action stage of change. As such, his goal is to continue with weight loss efforts. He has agreed to practicing portion control and making smarter food choices, such as increasing vegetables and decreasing simple carbohydrates.   Exercise goals: No exercise has been prescribed at this time.  Behavioral modification strategies: increasing lean protein intake, decreasing simple carbohydrates,  increasing water intake, decreasing sodium intake, meal planning and cooking strategies, better snacking choices and planning for success.  Edward Mccormick has agreed to follow-up with our clinic in 6 weeks. He was informed of the importance of frequent follow-up visits  to maximize his success with intensive lifestyle modifications for his multiple health conditions.   Objective:   Blood pressure (!) 148/79, pulse 73, temperature 98.2 F (36.8 C), height 6\' 1"  (1.854 m), weight 272 lb (123.4 kg), SpO2 100 %. Body mass index is 35.89 kg/m.  General: Cooperative, alert, well developed, in no acute distress. HEENT: Conjunctivae and lids unremarkable. Cardiovascular: Regular rhythm.  Lungs: Normal work of breathing. Neurologic: No focal deficits.   Lab Results  Component Value Date   CREATININE 1.19 06/30/2020   BUN 9 06/30/2020   NA 142 06/30/2020   K 4.2 06/30/2020   CL 105 06/30/2020   CO2 27 06/30/2020   Lab Results  Component Value Date   ALT 17 06/30/2020   AST 21 06/30/2020   ALKPHOS 54 06/30/2020   BILITOT 0.6 06/30/2020   Lab Results  Component Value Date   HGBA1C 5.7 (H) 06/30/2020   HGBA1C 5.6 11/26/2019   Lab Results  Component Value Date   INSULIN 11.0 06/30/2020   INSULIN 12.8 11/26/2019   Lab Results  Component Value Date   TSH 4.380 11/26/2019   Lab Results  Component Value Date   CHOL 186 06/30/2020   HDL 55 06/30/2020   LDLCALC 118 (H) 06/30/2020   LDLDIRECT 145.5 10/14/2011   TRIG 67 06/30/2020   CHOLHDL 4 09/11/2019   Lab Results  Component Value Date   WBC 6.0 11/26/2019   HGB 13.5 11/26/2019   HCT 40.9 11/26/2019   MCV 91 11/26/2019   PLT 172 11/26/2019   No results found for: IRON, TIBC, FERRITIN  Attestation Statements:   Reviewed by clinician on day of visit: allergies, medications, problem list, medical history, surgical history, family history, social history, and previous encounter notes.  I, Michaelene Song, am acting as Location manager for [employer], NP-C   I have reviewed the above documentation for accuracy and completeness, and I agree with the above. -  Mayari Matus d. Kailea Dannemiller, NP-C

## 2020-10-13 ENCOUNTER — Ambulatory Visit (INDEPENDENT_AMBULATORY_CARE_PROVIDER_SITE_OTHER): Payer: BC Managed Care – PPO | Admitting: Adult Health

## 2020-10-14 ENCOUNTER — Ambulatory Visit (INDEPENDENT_AMBULATORY_CARE_PROVIDER_SITE_OTHER): Payer: BC Managed Care – PPO | Admitting: Adult Health

## 2020-10-21 ENCOUNTER — Encounter (INDEPENDENT_AMBULATORY_CARE_PROVIDER_SITE_OTHER): Payer: Self-pay | Admitting: Adult Health

## 2020-10-21 ENCOUNTER — Other Ambulatory Visit: Payer: Self-pay

## 2020-10-21 ENCOUNTER — Ambulatory Visit (INDEPENDENT_AMBULATORY_CARE_PROVIDER_SITE_OTHER): Payer: BC Managed Care – PPO | Admitting: Adult Health

## 2020-10-21 VITALS — BP 136/87 | HR 76 | Temp 98.0°F | Ht 73.0 in | Wt 279.0 lb

## 2020-10-21 DIAGNOSIS — E559 Vitamin D deficiency, unspecified: Secondary | ICD-10-CM

## 2020-10-21 DIAGNOSIS — R7303 Prediabetes: Secondary | ICD-10-CM | POA: Diagnosis not present

## 2020-10-21 DIAGNOSIS — E782 Mixed hyperlipidemia: Secondary | ICD-10-CM | POA: Diagnosis not present

## 2020-10-21 DIAGNOSIS — Z9189 Other specified personal risk factors, not elsewhere classified: Secondary | ICD-10-CM | POA: Diagnosis not present

## 2020-10-21 DIAGNOSIS — R739 Hyperglycemia, unspecified: Secondary | ICD-10-CM | POA: Diagnosis not present

## 2020-10-21 DIAGNOSIS — I10 Essential (primary) hypertension: Secondary | ICD-10-CM

## 2020-10-21 DIAGNOSIS — Z6836 Body mass index (BMI) 36.0-36.9, adult: Secondary | ICD-10-CM

## 2020-10-21 NOTE — Progress Notes (Addendum)
Chief Complaint:   OBESITY Edward Mccormick is here to discuss his progress with his obesity treatment plan along with follow-up of his obesity related diagnoses. [name] is practicing portion control and making smarter food choices, such as increasing vegetables and decreasing simple carbohydrates and states he is following his eating plan approximately 75% of the time. [name] states he is exercising 0 minutes 0 times per week.  Today's visit was #: 12 Starting weight: 295 lbs Starting date: 11/26/2019 Today's weight: 279 lbs Today's date: 10/21/2020 Total lbs lost to date: 16 Total lbs lost since last in-office visit: 0  Interim History: Lewis drank 2 gallons of water early this morning. Bioimpedance scale shows his water weight is up by 5 lbs since his last office visit. Ambulatory blood pressure - systolic [phone]; with diastolic 83-66. He denies cardiac symptoms. He fasts every Wednesday for spiritual reasons.  Subjective:   Hyperlipidemia, pure. Lipid panel on 06/30/2020 showed LDL still above goal at 118, however, improved. Jahrell is not on statin therapy.   Lab Results  Component Value Date   CHOL 186 06/30/2020   HDL 55 06/30/2020   LDLCALC 118 (H) 06/30/2020   LDLDIRECT 145.5 10/14/2011   TRIG 67 06/30/2020   CHOLHDL 4 09/11/2019   Lab Results  Component Value Date   ALT 17 06/30/2020   AST 21 06/30/2020   ALKPHOS 54 06/30/2020   BILITOT 0.6 06/30/2020   The 10-year ASCVD risk score Edward Mccormick DC Jr., et al., 2013) is: 8%   Values used to calculate the score:     Age: 23 years     Sex: Male     Is Non-Hispanic African American: Yes     Diabetic: No     Tobacco smoker: No     Systolic Blood Pressure: 294 mmHg     Is BP treated: No     HDL Cholesterol: 55 mg/dL     Total Cholesterol: 186 mg/dL  Vitamin D deficiency. Vitamin D level on 06/30/2020 was 35.5, below goal of 50. [name] is on Ergocalciferol. No nausea, vomiting, or muscle weakness.    Ref. Range  06/30/2020 12:39  Vitamin D, 25-Hydroxy Latest Ref Range: 30.0 - 100.0 ng/mL 35.5   Prediabetes. Edward Mccormick has a diagnosis of prediabetes based on his elevated HgA1c and was informed this puts him at greater risk of developing diabetes. He continues to work on diet and exercise to decrease his risk of diabetes. He denies nausea or hypoglycemia. A1c was slightly elevated at 5.7 on 06/30/2020. [name] is not on metformin.  Lab Results  Component Value Date   HGBA1C 5.7 (H) 06/30/2020   Lab Results  Component Value Date   INSULIN 11.0 06/30/2020   INSULIN 12.8 11/26/2019   Hyperglycemia. Edward Mccormick has a history of some elevated blood glucose readings without a diagnosis of diabetes. He has had elevated blood glucose readings in Epic previously. Edward Mccormick is not on metformin.  Essential hypertension. Blood pressure and heart rate are well controlled without antihypertensive therapy. Edward Mccormick denies acute cardiac symptoms.  BP Readings from Last 3 Encounters:  10/21/20 136/87  09/02/20 (!) 148/79  07/22/20 120/79   Lab Results  Component Value Date   CREATININE 1.19 06/30/2020   CREATININE 0.98 11/26/2019   CREATININE 1.17 09/11/2019   At risk for heart disease. Edward Mccormick is at a higher than average risk for cardiovascular disease due to hyperlipidemia and obesity.   Assessment/Plan:   Hyperlipidemia, pure. Cardiovascular risk and specific lipid/LDL goals reviewed.  We discussed several lifestyle modifications today and Edward Mccormick will continue to work on diet, exercise and weight loss efforts. Orders and follow up as documented in patient record. Labs will be checked today.   Counseling Intensive lifestyle modifications are the first line treatment for this issue. . Dietary changes: Increase soluble fiber. Decrease simple carbohydrates. . Exercise changes: Moderate to vigorous-intensity aerobic activity 150 minutes per week if tolerated. . Lipid-lowering medications: see documented in medical  record.  Vitamin D deficiency. Low Vitamin D level contributes to fatigue and are associated with obesity, breast, and colon cancer. VITAMIN D 25 Hydroxy (Vit-D Deficiency, Fractures) level will be checked today.   Prediabetes. Adalbert will continue to work on weight loss, exercise, and decreasing simple carbohydrates to help decrease the risk of diabetes. Labs will be checked today.   Hyperglycemia. Fasting labs will be obtained and results with be discussed with Edward Mccormick in 2 weeks at his follow up visit. In the meanwhile Edward Mccormick was started on a lower simple carbohydrate diet and will work on weight loss efforts. Labs will be checked today.   Essential hypertension. Edward Mccormick is working on healthy weight loss and exercise to improve blood pressure control. We will watch for signs of hypotension as he continues his lifestyle modifications. Labs will be checked today.   At risk for heart disease. Edward Mccormick was given approximately 15 minutes of coronary artery disease prevention counseling today. He is 59 y.o. male and has risk factors for heart disease including obesity. We discussed intensive lifestyle modifications today with an emphasis on specific weight loss instructions and strategies.   Repetitive spaced learning was employed today to elicit superior memory formation and behavioral change.  Class 2 severe obesity with serious comorbidity and body mass index (BMI) of 36.0 to 36.9 in adult, unspecified obesity type ([place]).  Edward Mccormick is currently in the action stage of change. As such, his goal is to continue with weight loss efforts. He has agreed to practicing portion control and making smarter food choices, such as increasing vegetables and decreasing simple carbohydrates.   Exercise goals: Edward Mccormick is hunting.  Behavioral modification strategies:Increasing lean protein intake, no skipping meals, meal planning/prepping, better snacking choices, and planning for success.  Edward Mccormick has agreed to follow-up with  our clinic in 2 weeks. He was informed of the importance of frequent follow-up visits to maximize his success with intensive lifestyle modifications for his multiple health conditions.   [name] was informed we would discuss his lab results at his next visit unless there is a critical issue that needs to be addressed sooner. Journey agreed to keep his next visit at the agreed upon time to discuss these results.  Objective:   Blood pressure 136/87, pulse 76, temperature 98 F (36.7 C), height 6\' 1"  (1.854 m), weight 279 lb (126.6 kg), SpO2 100 %. Body mass index is 36.81 kg/m.  General: Cooperative, alert, well developed, in no acute distress. HEENT: Conjunctivae and lids unremarkable. Cardiovascular: Regular rhythm.  Lungs: Normal work of breathing. Neurologic: No focal deficits.   Lab Results  Component Value Date   CREATININE 1.19 06/30/2020   BUN 9 06/30/2020   NA 142 06/30/2020   K 4.2 06/30/2020   CL 105 06/30/2020   CO2 27 06/30/2020   Lab Results  Component Value Date   ALT 17 06/30/2020   AST 21 06/30/2020   ALKPHOS 54 06/30/2020   BILITOT 0.6 06/30/2020   Lab Results  Component Value Date   HGBA1C 5.7 (H) 06/30/2020  HGBA1C 5.6 11/26/2019   Lab Results  Component Value Date   INSULIN 11.0 06/30/2020   INSULIN 12.8 11/26/2019   Lab Results  Component Value Date   TSH 4.380 11/26/2019   Lab Results  Component Value Date   CHOL 186 06/30/2020   HDL 55 06/30/2020   LDLCALC 118 (H) 06/30/2020   LDLDIRECT 145.5 10/14/2011   TRIG 67 06/30/2020   CHOLHDL 4 09/11/2019   Lab Results  Component Value Date   WBC 6.0 11/26/2019   HGB 13.5 11/26/2019   HCT 40.9 11/26/2019   MCV 91 11/26/2019   PLT 172 11/26/2019   No results found for: IRON, TIBC, FERRITIN  Attestation Statements:   Reviewed by clinician on day of visit: allergies, medications, problem list, medical history, surgical history, family history, social history, and previous encounter  notes.  I, Michaelene Song, am acting as Location manager for [employer], NP-C   I have reviewed the above documentation for accuracy and completeness, and I agree with the above. -  Shawnda Mauney d. Flower Franko, NP-C

## 2020-10-22 LAB — COMPREHENSIVE METABOLIC PANEL
ALT: 13 IU/L (ref 0–44)
AST: 20 IU/L (ref 0–40)
Albumin/Globulin Ratio: 1.9 (ref 1.2–2.2)
Albumin: 4 g/dL (ref 3.8–4.9)
Alkaline Phosphatase: 60 IU/L (ref 44–121)
BUN/Creatinine Ratio: 13 (ref 9–20)
BUN: 13 mg/dL (ref 6–24)
Bilirubin Total: 0.3 mg/dL (ref 0.0–1.2)
CO2: 25 mmol/L (ref 20–29)
Calcium: 8.5 mg/dL — ABNORMAL LOW (ref 8.7–10.2)
Chloride: 108 mmol/L — ABNORMAL HIGH (ref 96–106)
Creatinine, Ser: 1.01 mg/dL (ref 0.76–1.27)
GFR calc Af Amer: 94 mL/min/{1.73_m2} (ref >59)
GFR calc non Af Amer: 82 mL/min/{1.73_m2} (ref >59)
Globulin, Total: 2.1 g/dL (ref 1.5–4.5)
Glucose: 93 mg/dL (ref 65–99)
Potassium: 4.1 mmol/L (ref 3.5–5.2)
Sodium: 143 mmol/L (ref 134–144)
Total Protein: 6.1 g/dL (ref 6.0–8.5)

## 2020-10-22 LAB — LIPID PANEL
Chol/HDL Ratio: 3 ratio (ref 0.0–5.0)
Cholesterol, Total: 195 mg/dL (ref 100–199)
HDL: 64 mg/dL (ref >39)
LDL Chol Calc (NIH): 123 mg/dL — ABNORMAL HIGH (ref 0–99)
Triglycerides: 44 mg/dL (ref 0–149)
VLDL Cholesterol Cal: 8 mg/dL (ref 5–40)

## 2020-10-22 LAB — VITAMIN D 25 HYDROXY (VIT D DEFICIENCY, FRACTURES): Vit D, 25-Hydroxy: 57.8 ng/mL (ref 30.0–100.0)

## 2020-10-22 LAB — INSULIN, RANDOM: INSULIN: 10.3 u[IU]/mL (ref 2.6–24.9)

## 2020-10-22 LAB — HEMOGLOBIN A1C
Est. average glucose Bld gHb Est-mCnc: 117 mg/dL
Hgb A1c MFr Bld: 5.7 % — ABNORMAL HIGH (ref 4.8–5.6)

## 2020-10-31 ENCOUNTER — Encounter (INDEPENDENT_AMBULATORY_CARE_PROVIDER_SITE_OTHER): Payer: Self-pay | Admitting: Adult Health

## 2020-11-04 ENCOUNTER — Ambulatory Visit (INDEPENDENT_AMBULATORY_CARE_PROVIDER_SITE_OTHER): Payer: BC Managed Care – PPO | Admitting: Adult Health

## 2020-11-24 ENCOUNTER — Ambulatory Visit (INDEPENDENT_AMBULATORY_CARE_PROVIDER_SITE_OTHER): Payer: BC Managed Care – PPO | Admitting: Adult Health

## 2021-01-20 ENCOUNTER — Other Ambulatory Visit (INDEPENDENT_AMBULATORY_CARE_PROVIDER_SITE_OTHER): Payer: Self-pay | Admitting: Adult Health

## 2021-01-20 ENCOUNTER — Other Ambulatory Visit: Payer: Self-pay

## 2021-01-20 ENCOUNTER — Ambulatory Visit (INDEPENDENT_AMBULATORY_CARE_PROVIDER_SITE_OTHER): Payer: BC Managed Care – PPO | Admitting: Adult Health

## 2021-01-20 ENCOUNTER — Encounter (INDEPENDENT_AMBULATORY_CARE_PROVIDER_SITE_OTHER): Payer: Self-pay | Admitting: Adult Health

## 2021-01-20 VITALS — BP 130/84 | HR 73 | Temp 97.9°F | Ht 73.0 in | Wt 278.0 lb

## 2021-01-20 DIAGNOSIS — R7303 Prediabetes: Secondary | ICD-10-CM

## 2021-01-20 DIAGNOSIS — Z9189 Other specified personal risk factors, not elsewhere classified: Secondary | ICD-10-CM

## 2021-01-20 DIAGNOSIS — E559 Vitamin D deficiency, unspecified: Secondary | ICD-10-CM

## 2021-01-20 DIAGNOSIS — I10 Essential (primary) hypertension: Secondary | ICD-10-CM

## 2021-01-20 DIAGNOSIS — Z6836 Body mass index (BMI) 36.0-36.9, adult: Secondary | ICD-10-CM

## 2021-01-20 DIAGNOSIS — E782 Mixed hyperlipidemia: Secondary | ICD-10-CM

## 2021-01-20 MED ORDER — LOSARTAN POTASSIUM 100 MG PO TABS: 100.0000 mg | ORAL_TABLET | Freq: Every day | ORAL | 0 refills | Status: DC

## 2021-01-21 LAB — COMPREHENSIVE METABOLIC PANEL
ALT: 19 IU/L (ref 0–44)
AST: 23 IU/L (ref 0–40)
Albumin/Globulin Ratio: 1.9 (ref 1.2–2.2)
Albumin: 4.3 g/dL (ref 3.8–4.9)
Alkaline Phosphatase: 54 IU/L (ref 44–121)
BUN/Creatinine Ratio: 10 (ref 9–20)
BUN: 12 mg/dL (ref 6–24)
Bilirubin Total: 0.4 mg/dL (ref 0.0–1.2)
CO2: 23 mmol/L (ref 20–29)
Calcium: 9.3 mg/dL (ref 8.7–10.2)
Chloride: 104 mmol/L (ref 96–106)
Creatinine, Ser: 1.23 mg/dL (ref 0.76–1.27)
Globulin, Total: 2.3 g/dL (ref 1.5–4.5)
Glucose: 86 mg/dL (ref 65–99)
Potassium: 4.6 mmol/L (ref 3.5–5.2)
Sodium: 142 mmol/L (ref 134–144)
Total Protein: 6.6 g/dL (ref 6.0–8.5)
eGFR: 68 mL/min/{1.73_m2} (ref >59)

## 2021-01-21 LAB — LIPID PANEL
Chol/HDL Ratio: 2.9 ratio (ref 0.0–5.0)
Cholesterol, Total: 188 mg/dL (ref 100–199)
HDL: 64 mg/dL (ref >39)
LDL Chol Calc (NIH): 112 mg/dL — ABNORMAL HIGH (ref 0–99)
Triglycerides: 66 mg/dL (ref 0–149)
VLDL Cholesterol Cal: 12 mg/dL (ref 5–40)

## 2021-01-21 LAB — INSULIN, RANDOM: INSULIN: 8.7 u[IU]/mL (ref 2.6–24.9)

## 2021-01-21 LAB — VITAMIN D 25 HYDROXY (VIT D DEFICIENCY, FRACTURES): Vit D, 25-Hydroxy: 62.3 ng/mL (ref 30.0–100.0)

## 2021-01-21 LAB — HEMOGLOBIN A1C
Est. average glucose Bld gHb Est-mCnc: 120 mg/dL
Hgb A1c MFr Bld: 5.8 % — ABNORMAL HIGH (ref 4.8–5.6)

## 2021-01-21 NOTE — Progress Notes (Signed)
Chief Complaint:   OBESITY Edward Mccormick is here to discuss his progress with his obesity treatment plan along with follow-up of his obesity related diagnoses. Edward Mccormick is on practicing portion control and making smarter food choices, such as increasing vegetables and decreasing simple carbohydrates and states he is following his eating plan approximately 0% of the time. Edward Mccormick states he is walking (hunting) 2 times per week.  Today's visit was #: 44 Starting weight: 295 lbs Starting date: 1/5/20212 Today's weight: 278 lbs Today's date: 01/20/2021 Total lbs lost to date: 17 lbs Total lbs lost since last in-office visit: 1 lb  Interim History: Edward Mccormick has preferred to follow PC/[place] plan. He doesn't feel stressed with this focus. He will be traveling to [place] soon to help move his daughter home.  Subjective:   1. Pre-diabetes Edward Mccormick's A1c has been 5.7 at the last 2 checks. He is not on Metformin.  2. Hyperlipidemia, pure Edward Mccormick's LDL has been above goal at last 3 checks.   3. Essential hypertension Edward Mccormick's ambulatory BP readings are systolic BP 195'K and diastolic 93-26 with losartan 100 mg daily. His systolic BP is int he 712'W and diastolic 58-099 without the ARB. He clearly benefits from the anti-hypertensive prescription.  4. Vitamin D deficiency Edward Mccormick's Vitamin D level was 57.8 on 10/21/2020. He is currently taking prescription vitamin D 50,000 IU each week. He denies nausea, vomiting or muscle weakness.   Ref. Range 10/21/2020 09:44  Vitamin D, 25-Hydroxy Latest Ref Range: 30.0 - 100.0 ng/mL 57.8   5. At risk for diabetes mellitus Edward Mccormick is at higher than average risk for developing diabetes due to obesity and pre-diabetes.  Assessment/Plan:   1. Pre-diabetes Edward Mccormick will continue to work on weight loss, exercise, and decreasing simple carbohydrates to help decrease the risk of diabetes. Check labs today. Increase protein intake.  - Hemoglobin A1c - Insulin, random  2.  Hyperlipidemia, pure Cardiovascular risk and specific lipid/LDL goals reviewed.  We discussed several lifestyle modifications today and Edward Mccormick will continue to work on diet, exercise and weight loss efforts. Orders and follow up as documented in patient record. Check labs today.  Counseling Intensive lifestyle modifications are the first line treatment for this issue. . Dietary changes: Increase soluble fiber. Decrease simple carbohydrates. . Exercise changes: Moderate to vigorous-intensity aerobic activity 150 minutes per week if tolerated. . Lipid-lowering medications: see documented in medical record.  - Comprehensive metabolic panel - Lipid panel  3. Essential hypertension Edward Mccormick is working on healthy weight loss and exercise to improve blood pressure control. We will watch for signs of hypotension as he continues his lifestyle modifications. Check labs today. Continue current anti-hypertensive regimen.  4. Vitamin D deficiency Low Vitamin D level contributes to fatigue and are associated with obesity, breast, and colon cancer. He agrees to continue to take prescription Vitamin D @50 ,000 IU every week and will follow-up for routine testing of Vitamin D, at least 2-3 times per year to avoid over-replacement. Check labs today.  - VITAMIN D 25 Hydroxy (Vit-D Deficiency, Fractures)  5. At risk for diabetes mellitus Edward Mccormick was given approximately 15 minutes of diabetes education and counseling today. We discussed intensive lifestyle modifications today with an emphasis on weight loss as well as increasing exercise and decreasing simple carbohydrates in his diet. We also reviewed medication options with an emphasis on risk versus benefit of those discussed.   Repetitive spaced learning was employed today to elicit superior memory formation and behavioral change.  6. Class 2 severe  obesity with serious comorbidity and body mass index (BMI) of 36.0 to 36.9 in adult, unspecified obesity type  [facility]) Edward Mccormick is currently in the action stage of change. As such, his goal is to continue with weight loss efforts. He has agreed to practicing portion control and making smarter food choices, such as increasing vegetables and decreasing simple carbohydrates.   Handout: Eating Out Guide  Exercise goals: As is  Behavioral modification strategies: increasing lean protein intake, no skipping meals, meal planning and cooking strategies, better snacking choices, travel eating strategies and planning for success.  Edward Mccormick has agreed to follow-up with our clinic in 3 weeks. He was informed of the importance of frequent follow-up visits to maximize his success with intensive lifestyle modifications for his multiple health conditions.   Edward Mccormick was informed we would discuss his lab results at his next visit unless there is a critical issue that needs to be addressed sooner. Edward Mccormick agreed to keep his next visit at the agreed upon time to discuss these results.  Objective:   Blood pressure 130/84, pulse 73, temperature 97.9 F (36.6 C), height 6\' 1"  (1.854 m), weight 278 lb (126.1 kg), SpO2 100 %. Body mass index is 36.68 kg/m.  General: Cooperative, alert, well developed, in no acute distress. HEENT: Conjunctivae and lids unremarkable. Cardiovascular: Regular rhythm.  Lungs: Normal work of breathing. Neurologic: No focal deficits.   Lab Results  Component Value Date   CREATININE 1.23 01/20/2021   BUN 12 01/20/2021   NA 142 01/20/2021   K 4.6 01/20/2021   CL 104 01/20/2021   CO2 23 01/20/2021   Lab Results  Component Value Date   ALT 19 01/20/2021   AST 23 01/20/2021   ALKPHOS 54 01/20/2021   BILITOT 0.4 01/20/2021   Lab Results  Component Value Date   HGBA1C 5.8 (H) 01/20/2021   HGBA1C 5.7 (H) 10/21/2020   HGBA1C 5.7 (H) 06/30/2020   HGBA1C 5.6 11/26/2019   Lab Results  Component Value Date   INSULIN 8.7 01/20/2021   INSULIN 10.3 10/21/2020   INSULIN 11.0 06/30/2020   INSULIN  12.8 11/26/2019   Lab Results  Component Value Date   TSH 4.380 11/26/2019   Lab Results  Component Value Date   CHOL 188 01/20/2021   HDL 64 01/20/2021   LDLCALC 112 (H) 01/20/2021   LDLDIRECT 145.5 10/14/2011   TRIG 66 01/20/2021   CHOLHDL 2.9 01/20/2021   Lab Results  Component Value Date   WBC 6.0 11/26/2019   HGB 13.5 11/26/2019   HCT 40.9 11/26/2019   MCV 91 11/26/2019   PLT 172 11/26/2019    Attestation Statements:   Reviewed by clinician on day of visit: allergies, medications, problem list, medical history, surgical history, family history, social history, and previous encounter notes.  Coral Ceo, am acting as Location manager for Mina Marble, NP.  I have reviewed the above documentation for accuracy and completeness, and I agree with the above. -  Han Lysne d. Chelesea Weiand, NP-C

## 2021-02-10 ENCOUNTER — Encounter (INDEPENDENT_AMBULATORY_CARE_PROVIDER_SITE_OTHER): Payer: Self-pay | Admitting: Adult Health

## 2021-02-10 ENCOUNTER — Other Ambulatory Visit: Payer: Self-pay

## 2021-02-10 ENCOUNTER — Other Ambulatory Visit (HOSPITAL_COMMUNITY): Payer: Self-pay | Admitting: Adult Health

## 2021-02-10 ENCOUNTER — Ambulatory Visit (INDEPENDENT_AMBULATORY_CARE_PROVIDER_SITE_OTHER): Payer: BC Managed Care – PPO | Admitting: Adult Health

## 2021-02-10 ENCOUNTER — Other Ambulatory Visit (INDEPENDENT_AMBULATORY_CARE_PROVIDER_SITE_OTHER): Payer: Self-pay | Admitting: Adult Health

## 2021-02-10 VITALS — BP 129/87 | HR 70 | Temp 98.0°F | Ht 73.0 in | Wt 276.0 lb

## 2021-02-10 DIAGNOSIS — R7303 Prediabetes: Secondary | ICD-10-CM

## 2021-02-10 DIAGNOSIS — Z6836 Body mass index (BMI) 36.0-36.9, adult: Secondary | ICD-10-CM

## 2021-02-10 DIAGNOSIS — Z9189 Other specified personal risk factors, not elsewhere classified: Secondary | ICD-10-CM

## 2021-02-10 DIAGNOSIS — E559 Vitamin D deficiency, unspecified: Secondary | ICD-10-CM | POA: Diagnosis not present

## 2021-02-10 DIAGNOSIS — I10 Essential (primary) hypertension: Secondary | ICD-10-CM

## 2021-02-10 DIAGNOSIS — E7849 Other hyperlipidemia: Secondary | ICD-10-CM | POA: Diagnosis not present

## 2021-02-10 MED ORDER — LOSARTAN POTASSIUM 100 MG PO TABS: 100.0000 mg | ORAL_TABLET | Freq: Every day | ORAL | 0 refills | Status: DC

## 2021-02-10 MED ORDER — ROSUVASTATIN CALCIUM 10 MG PO TABS: ORAL_TABLET | ORAL | 0 refills | Status: DC

## 2021-02-11 ENCOUNTER — Other Ambulatory Visit (INDEPENDENT_AMBULATORY_CARE_PROVIDER_SITE_OTHER): Payer: Self-pay | Admitting: Adult Health

## 2021-02-11 DIAGNOSIS — E7849 Other hyperlipidemia: Secondary | ICD-10-CM

## 2021-02-11 MED FILL — ROSUVASTATIN CALCIUM 10 MG: 10 | 42 days supply | Qty: 12 | Fill #0

## 2021-02-11 NOTE — Progress Notes (Signed)
Chief Complaint:   OBESITY Edward Mccormick is here to discuss his progress with his obesity treatment plan along with follow-up of his obesity related diagnoses. Edward Mccormick is on practicing portion control and making smarter food choices, such as increasing vegetables and decreasing simple carbohydrates and states he is following his eating plan approximately 70% of the time. Edward Mccormick states he is doing 0 minutes 0 times per week.  Today's visit was #: 14 Starting weight: 295 lbs Starting date: 11/26/2019 Today's weight: 276 lbs Today's date: 02/10/2021 Total lbs lost to date: 19 lbs Total lbs lost since last in-office visit: 2 lbs  Interim History: Edward Mccormick recently traveled to [place] to assist one of his daughter's with a move to the [facility]. The move went well. He continues to enjoy the flexibility of [facility] plan. He is down 2 lbs since last OV!  Subjective:   1. Essential hypertension Discussed labs with patient today. Draden's BP and heart rate are excellent at OV. He is on losartan 100 mg QD. He denies cardiac symptoms.  BP Readings from Last 3 Encounters:  02/10/21 129/87  01/20/21 130/84  10/21/20 136/87   2. Pre-diabetes Worsening. Discussed labs with patient today. Edward Mccormick's 01/20/2021 A1c slightly elevated at 5.8, with normal BG at 86 and improved insulin level at 8.7. He denies known family history of T2D.  Lab Results  Component Value Date   HGBA1C 5.8 (H) 01/20/2021   Lab Results  Component Value Date   INSULIN 8.7 01/20/2021   INSULIN 10.3 10/21/2020   INSULIN 11.0 06/30/2020   INSULIN 12.8 11/26/2019    3. Other hyperlipidemia Discussed labs with patient today. Edward Mccormick's 01/20/2021 lipid panel resulted total/HDL/Tg all at goal. His LDL has improved, however, still above goal at 112. His ASCVD risk >11%.  Lab Results  Component Value Date   CHOL 188 01/20/2021   HDL 64 01/20/2021   LDLCALC 112 (H) 01/20/2021   LDLDIRECT 145.5 10/14/2011   TRIG 66 01/20/2021   CHOLHDL 2.9  01/20/2021   Lab Results  Component Value Date   ALT 19 01/20/2021   AST 23 01/20/2021   ALKPHOS 54 01/20/2021   BILITOT 0.4 01/20/2021   The 10-year ASCVD risk score Edward Bussing DC Jr., et al., 2013) is: 11.5%   Values used to calculate the score:     Age: 40 years     Sex: Male     Is Non-Hispanic African American: Yes     Diabetic: No     Tobacco smoker: No     Systolic Blood Pressure: 924 mmHg     Is BP treated: Yes     HDL Cholesterol: 64 mg/dL     Total Cholesterol: 188 mg/dL  4. Vitamin D deficiency Discussed labs with patient today. Jontay's Vitamin D level was at goal at 62.3 on 01/20/2021. He is currently taking prescription vitamin D 50,000 IU each week. He denies nausea, vomiting or muscle weakness.   Ref. Range 01/20/2021 09:56  Vitamin D, 25-Hydroxy Latest Ref Range: 30.0 - 100.0 ng/mL 62.3   5. At risk for heart disease Edward Mccormick is at a higher than average risk for cardiovascular disease due to obesity, hyperlipidemia, and hypertension.  Assessment/Plan:   1. Essential hypertension Jojuan is working on healthy weight loss and exercise to improve blood pressure control. We will watch for signs of hypotension as he continues his lifestyle modifications.  - losartan (COZAAR) 100 MG tablet; Take 1 tablet (100 mg total) by mouth daily.  Dispense: 90  tablet; Refill: 0  2. Pre-diabetes Oreste will continue to work on weight loss, exercise, and decreasing simple carbohydrates to help decrease the risk of diabetes. Continue to increase protein.  3. Other hyperlipidemia Cardiovascular risk and specific lipid/LDL goals reviewed.  We discussed several lifestyle modifications today and Sire will continue to work on diet, exercise and weight loss efforts. Orders and follow up as documented in patient record.  Start Crestor 10 mg twice a week, as per below. Will check LFTs in 6 weeks, Lipid Panel in 3 months.  Counseling Intensive lifestyle modifications are the first line treatment  for this issue. . Dietary changes: Increase soluble fiber. Decrease simple carbohydrates. . Exercise changes: Moderate to vigorous-intensity aerobic activity 150 minutes per week if tolerated. . Lipid-lowering medications: see documented in medical record.  - rosuvastatin (CRESTOR) 10 MG tablet; 1 tablet by mouth twice a week - wed and sun  Dispense: 12 tablet; Refill: 0  4. Vitamin D deficiency Low Vitamin D level contributes to fatigue and are associated with obesity, breast, and colon cancer. He agrees to complete current prescription Vitamin D @50 ,000 IU every week, then convert to OTC Vit D3 5,000 IU QD. He will follow-up for routine testing of Vitamin D, at least 2-3 times per year to avoid over-replacement.  5. At risk for heart disease Vernor was given approximately 15 minutes of coronary artery disease prevention counseling today. He is 60 y.o. male and has risk factors for heart disease including obesity. We discussed intensive lifestyle modifications today with an emphasis on specific weight loss instructions and strategies.   Repetitive spaced learning was employed today to elicit superior memory formation and behavioral change.  6. Class 2 severe obesity with serious comorbidity and body mass index (BMI) of 36.0 to 36.9 in adult, unspecified obesity type [facility]) Edward Mccormick is currently in the action stage of change. As such, his goal is to continue with weight loss efforts. He has agreed to practicing portion control and making smarter food choices, such as increasing vegetables and decreasing simple carbohydrates.   Exercise goals: As is- Work hard in Environmental health practitioner modification strategies: increasing lean protein intake, decreasing simple carbohydrates, meal planning and cooking strategies and planning for success.  Edward Mccormick has agreed to follow-up with our clinic in 3 weeks. He was informed of the importance of frequent follow-up visits to maximize his success with intensive lifestyle  modifications for his multiple health conditions.   Objective:   Blood pressure 129/87, pulse 70, temperature 98 F (36.7 C), height 6\' 1"  (1.854 m), weight 276 lb (125.2 kg), SpO2 100 %. Body mass index is 36.41 kg/m.  General: Cooperative, alert, well developed, in no acute distress. HEENT: Conjunctivae and lids unremarkable. Cardiovascular: Regular rhythm.  Lungs: Normal work of breathing. Neurologic: No focal deficits.   Lab Results  Component Value Date   CREATININE 1.23 01/20/2021   BUN 12 01/20/2021   NA 142 01/20/2021   K 4.6 01/20/2021   CL 104 01/20/2021   CO2 23 01/20/2021   Lab Results  Component Value Date   ALT 19 01/20/2021   AST 23 01/20/2021   ALKPHOS 54 01/20/2021   BILITOT 0.4 01/20/2021   Lab Results  Component Value Date   HGBA1C 5.8 (H) 01/20/2021   HGBA1C 5.7 (H) 10/21/2020   HGBA1C 5.7 (H) 06/30/2020   HGBA1C 5.6 11/26/2019   Lab Results  Component Value Date   INSULIN 8.7 01/20/2021   INSULIN 10.3 10/21/2020   INSULIN 11.0 06/30/2020  INSULIN 12.8 11/26/2019   Lab Results  Component Value Date   TSH 4.380 11/26/2019   Lab Results  Component Value Date   CHOL 188 01/20/2021   HDL 64 01/20/2021   LDLCALC 112 (H) 01/20/2021   LDLDIRECT 145.5 10/14/2011   TRIG 66 01/20/2021   CHOLHDL 2.9 01/20/2021   Lab Results  Component Value Date   WBC 6.0 11/26/2019   HGB 13.5 11/26/2019   HCT 40.9 11/26/2019   MCV 91 11/26/2019   PLT 172 11/26/2019    Attestation Statements:   Reviewed by clinician on day of visit: allergies, medications, problem list, medical history, surgical history, family history, social history, and previous encounter notes.  Coral Ceo, am acting as Location manager for Mina Marble, NP.  I have reviewed the above documentation for accuracy and completeness, and I agree with the above. -  Marylin Lathon d. Siyon Linck, NP-C

## 2021-03-03 ENCOUNTER — Encounter (INDEPENDENT_AMBULATORY_CARE_PROVIDER_SITE_OTHER): Payer: Self-pay | Admitting: Adult Health

## 2021-03-03 ENCOUNTER — Ambulatory Visit (INDEPENDENT_AMBULATORY_CARE_PROVIDER_SITE_OTHER): Payer: BC Managed Care – PPO | Admitting: Adult Health

## 2021-03-03 ENCOUNTER — Other Ambulatory Visit: Payer: Self-pay

## 2021-03-03 VITALS — BP 112/74 | HR 70 | Temp 98.3°F | Ht 73.0 in | Wt 271.0 lb

## 2021-03-03 DIAGNOSIS — E7849 Other hyperlipidemia: Secondary | ICD-10-CM

## 2021-03-03 DIAGNOSIS — Z6839 Body mass index (BMI) 39.0-39.9, adult: Secondary | ICD-10-CM | POA: Diagnosis not present

## 2021-03-03 DIAGNOSIS — I10 Essential (primary) hypertension: Secondary | ICD-10-CM

## 2021-03-04 NOTE — Progress Notes (Signed)
Chief Complaint:   OBESITY Randel is here to discuss his progress with his obesity treatment plan along with follow-up of his obesity related diagnoses. Karder is on practicing portion control and making smarter food choices, such as increasing vegetables and decreasing simple carbohydrates and states he is following his eating plan approximately 80% of the time. Shane states he is not currently exercising.  Today's visit was #: 15 Starting weight: 295 lbs Starting date: 11/26/2019 Today's weight: 271 lbs Today's date: 03/03/2021 Total lbs lost to date: 24 Total lbs lost since last in-office visit: 5  Interim History: Cliffard' first meal of the day is often lunch. If he has breakfast, it consists of a combination of eggs, omelettes, hot cakes, and bacon. Lunch and dinner contains vegetables with some form if protein (beans, lean meat).  Subjective:   1. Essential hypertension Treyshawn' BP/HR are excellent at [place]. He is on losartan 100 mg QD.   BP Readings from Last 3 Encounters:  03/03/21 112/74  02/10/21 129/87  01/20/21 130/84   2. Other hyperlipidemia Eldredge was started on rosuvastatin 10 mg twice a week on 02/10/2021. He denies myalgias. He is taking med on Wednesday and Sunday.  Lab Results  Component Value Date   ALT 19 01/20/2021   AST 23 01/20/2021   ALKPHOS 54 01/20/2021   BILITOT 0.4 01/20/2021   Lab Results  Component Value Date   CHOL 188 01/20/2021   HDL 64 01/20/2021   LDLCALC 112 (H) 01/20/2021   LDLDIRECT 145.5 10/14/2011   TRIG 66 01/20/2021   CHOLHDL 2.9 01/20/2021    Assessment/Plan:   1. Essential hypertension Jayven is working on healthy weight loss and exercise to improve blood pressure control. We will watch for signs of hypotension as he continues his lifestyle modifications. Continue losartan 100 mg QD.  2. Other hyperlipidemia Cardiovascular risk and specific lipid/LDL goals reviewed.  We discussed several lifestyle modifications today and Tahjai  will continue to work on diet, exercise and weight loss efforts. Orders and follow up as documented in patient record.  Check CMP at next OV.  Counseling Intensive lifestyle modifications are the first line treatment for this issue. . Dietary changes: Increase soluble fiber. Decrease simple carbohydrates. . Exercise changes: Moderate to vigorous-intensity aerobic activity 150 minutes per week if tolerated. . Lipid-lowering medications: see documented in medical record.  3. Obesity with BMI 35.8 Rad is currently in the action stage of change. As such, his goal is to continue with weight loss efforts. He has agreed to practicing portion control and making smarter food choices, such as increasing vegetables and decreasing simple carbohydrates.   Check CMP at next OV regarding statin therapy started 02/10/2021. Check fasting labs and IC June 2022.  Exercise goals: Mow grass, Architect, Engineer, water modification strategies: increasing lean protein intake, decreasing simple carbohydrates, keeping healthy foods in the home, better snacking choices and planning for success.  Sadarius has agreed to follow-up with our clinic in 4 weeks. He was informed of the importance of frequent follow-up visits to maximize his success with intensive lifestyle modifications for his multiple health conditions.   Objective:   Blood pressure 112/74, pulse 70, temperature 98.3 F (36.8 C), height 6\' 1"  (1.854 m), weight 271 lb (122.9 kg), SpO2 100 %. Body mass index is 35.75 kg/m.  General: Cooperative, alert, well developed, in no acute distress. HEENT: Conjunctivae and lids unremarkable. Cardiovascular: Regular rhythm.  Lungs: Normal work of breathing. Neurologic: No focal deficits.  Lab Results  Component Value Date   CREATININE 1.23 01/20/2021   BUN 12 01/20/2021   NA 142 01/20/2021   K 4.6 01/20/2021   CL 104 01/20/2021   CO2 23 01/20/2021   Lab Results  Component Value Date    ALT 19 01/20/2021   AST 23 01/20/2021   ALKPHOS 54 01/20/2021   BILITOT 0.4 01/20/2021   Lab Results  Component Value Date   HGBA1C 5.8 (H) 01/20/2021   HGBA1C 5.7 (H) 10/21/2020   HGBA1C 5.7 (H) 06/30/2020   HGBA1C 5.6 11/26/2019   Lab Results  Component Value Date   INSULIN 8.7 01/20/2021   INSULIN 10.3 10/21/2020   INSULIN 11.0 06/30/2020   INSULIN 12.8 11/26/2019   Lab Results  Component Value Date   TSH 4.380 11/26/2019   Lab Results  Component Value Date   CHOL 188 01/20/2021   HDL 64 01/20/2021   LDLCALC 112 (H) 01/20/2021   LDLDIRECT 145.5 10/14/2011   TRIG 66 01/20/2021   CHOLHDL 2.9 01/20/2021   Lab Results  Component Value Date   WBC 6.0 11/26/2019   HGB 13.5 11/26/2019   HCT 40.9 11/26/2019   MCV 91 11/26/2019   PLT 172 11/26/2019    Attestation Statements:   Reviewed by clinician on day of visit: allergies, medications, problem list, medical history, surgical history, family history, social history, and previous encounter notes.  Time spent on visit including pre-visit chart review and post-visit care and charting was 31 minutes.   Coral Ceo, am acting as Location manager for Mina Marble, NP.  I have reviewed the above documentation for accuracy and completeness, and I agree with the above. -  Vallerie Hentz d. Rome Echavarria, NP-C

## 2021-03-13 ENCOUNTER — Other Ambulatory Visit (INDEPENDENT_AMBULATORY_CARE_PROVIDER_SITE_OTHER): Payer: Self-pay | Admitting: Adult Health

## 2021-03-13 DIAGNOSIS — E559 Vitamin D deficiency, unspecified: Secondary | ICD-10-CM

## 2021-03-15 ENCOUNTER — Encounter (INDEPENDENT_AMBULATORY_CARE_PROVIDER_SITE_OTHER): Payer: Self-pay | Admitting: Adult Health

## 2021-03-15 ENCOUNTER — Other Ambulatory Visit (INDEPENDENT_AMBULATORY_CARE_PROVIDER_SITE_OTHER): Payer: Self-pay | Admitting: Adult Health

## 2021-03-15 NOTE — Addendum Note (Signed)
Addended by: Mina Marble D on: 03/15/2021 11:58 AM   Modules accepted: Orders

## 2021-03-15 NOTE — Telephone Encounter (Signed)
Refill request

## 2021-03-22 ENCOUNTER — Other Ambulatory Visit (INDEPENDENT_AMBULATORY_CARE_PROVIDER_SITE_OTHER): Payer: Self-pay | Admitting: Adult Health

## 2021-03-22 MED ORDER — ROSUVASTATIN CALCIUM 10 MG PO TABS: ORAL_TABLET | ORAL | 0 refills | Status: DC

## 2021-03-22 NOTE — Telephone Encounter (Signed)
For you -

## 2021-03-31 ENCOUNTER — Encounter (INDEPENDENT_AMBULATORY_CARE_PROVIDER_SITE_OTHER): Payer: Self-pay | Admitting: Adult Health

## 2021-03-31 ENCOUNTER — Other Ambulatory Visit (INDEPENDENT_AMBULATORY_CARE_PROVIDER_SITE_OTHER): Payer: Self-pay | Admitting: Adult Health

## 2021-03-31 ENCOUNTER — Other Ambulatory Visit (HOSPITAL_COMMUNITY): Payer: Self-pay

## 2021-03-31 ENCOUNTER — Other Ambulatory Visit: Payer: Self-pay

## 2021-03-31 ENCOUNTER — Ambulatory Visit (INDEPENDENT_AMBULATORY_CARE_PROVIDER_SITE_OTHER): Payer: BC Managed Care – PPO | Admitting: Adult Health

## 2021-03-31 VITALS — BP 115/76 | HR 68 | Temp 98.3°F | Ht 73.0 in | Wt 267.0 lb

## 2021-03-31 DIAGNOSIS — Z6835 Body mass index (BMI) 35.0-35.9, adult: Secondary | ICD-10-CM | POA: Diagnosis not present

## 2021-03-31 DIAGNOSIS — Z9189 Other specified personal risk factors, not elsewhere classified: Secondary | ICD-10-CM

## 2021-03-31 DIAGNOSIS — E559 Vitamin D deficiency, unspecified: Secondary | ICD-10-CM | POA: Diagnosis not present

## 2021-03-31 DIAGNOSIS — E7849 Other hyperlipidemia: Secondary | ICD-10-CM | POA: Diagnosis not present

## 2021-03-31 DIAGNOSIS — I10 Essential (primary) hypertension: Secondary | ICD-10-CM

## 2021-03-31 NOTE — Progress Notes (Signed)
Chief Complaint:   OBESITY Edward Mccormick is here to discuss his progress with his obesity treatment plan along with follow-up of his obesity related diagnoses. Edward Mccormick is on practicing portion control and making smarter food choices, such as increasing vegetables and decreasing simple carbohydrates and states he is following his eating plan approximately 85% of the time. Edward Mccormick states he is not currently exercising.  Today's visit was #: 40 Starting weight: 295 lbs Starting date: 11/26/2019 Today's weight: 267 lbs Today's date: 03/31/2021 Total lbs lost to date: 28 lbs Total lbs lost since last in-office visit: 4  Interim History: Edward Mccormick experienced food poisoning 3 days ago- N/V/D.  He followed BRAT diet the last several days. He is slowly beginning to feel better. He has not vomited today.  Subjective:   1. Other hyperlipidemia Edward Mccormick was started on rosuvastatin 10 mg twice weekly. He denies myalgias.   Lab Results  Component Value Date   ALT 19 01/20/2021   AST 23 01/20/2021   ALKPHOS 54 01/20/2021   BILITOT 0.4 01/20/2021   Lab Results  Component Value Date   CHOL 188 01/20/2021   HDL 64 01/20/2021   LDLCALC 112 (H) 01/20/2021   LDLDIRECT 145.5 10/14/2011   TRIG 66 01/20/2021   CHOLHDL 2.9 01/20/2021    2. Vitamin D deficiency Edward Mccormick's Vitamin D level was at 62.3 on 01/20/2021, at goal. He was converted from Ergocalciferol to OTC Vit D3 1,000 IU QD. He has been off Ergocalciferol for 2 weeks and has had 1 dose of OTC Vit D3 1,000 IU. He denies nausea, vomiting or muscle weakness.  3. At risk for heart disease Edward Mccormick is at a higher than average risk for cardiovascular disease due to obesity, hyperlipidemia, and hypertension.  Assessment/Plan:   1. Other hyperlipidemia Cardiovascular risk and specific lipid/LDL goals reviewed.  We discussed several lifestyle modifications today and Edward Mccormick will continue to work on diet, exercise and weight loss efforts. Orders and follow up as  documented in patient record. Check labs today.  Counseling Intensive lifestyle modifications are the first line treatment for this issue. . Dietary changes: Increase soluble fiber. Decrease simple carbohydrates. . Exercise changes: Moderate to vigorous-intensity aerobic activity 150 minutes per week if tolerated. . Lipid-lowering medications: see documented in medical record. - Comprehensive metabolic panel - Lipid panel  2. Vitamin D deficiency Low Vitamin D level contributes to fatigue and are associated with obesity, breast, and colon cancer. He agrees to continue to take OTC Vitamin D @1 ,000 IU daily and will follow-up for routine testing of Vitamin D, at least 2-3 times per year to avoid over-replacement. Check labs in 2 months.  3. At risk for heart disease Edward Mccormick was given approximately 15 minutes of coronary artery disease prevention counseling today. He is 60 y.o. male and has risk factors for heart disease including obesity. We discussed intensive lifestyle modifications today with an emphasis on specific weight loss instructions and strategies.   Repetitive spaced learning was employed today to elicit superior memory formation and behavioral change.  4. Class 2 severe obesity with serious comorbidity and body mass index (BMI) of 35.0 to 35.9 in adult, unspecified obesity type Edward Mccormick)  Edward Mccormick is currently in the action stage of change. As such, his goal is to continue with weight loss efforts. He has agreed to practicing portion control and making smarter food choices, such as increasing vegetables and decreasing simple carbohydrates.   Follow BRAT diet during acute GI upset.  Exercise goals: As is  Behavioral modification strategies: increasing lean protein intake, decreasing simple carbohydrates, meal planning and cooking strategies, better snacking choices and planning for success.  Edward Mccormick has agreed to follow-up with our clinic in 4 weeks. He was informed of the importance of  frequent follow-up visits to maximize his success with intensive lifestyle modifications for his multiple health conditions.   Edward Mccormick was informed we would discuss his lab results at his next visit unless there is a critical issue that needs to be addressed sooner. Edward Mccormick agreed to keep his next visit at the agreed upon time to discuss these results.  Objective:   Blood pressure 115/76, pulse 68, temperature 98.3 F (36.8 C), height 6\' 1"  (1.854 m), weight 267 lb (121.1 kg), SpO2 99 %. Body mass index is 35.23 kg/m.  General: Cooperative, alert, well developed, in no acute distress. HEENT: Conjunctivae and lids unremarkable. Cardiovascular: Regular rhythm.  Lungs: Normal work of breathing. Neurologic: No focal deficits.   Lab Results  Component Value Date   CREATININE 1.23 01/20/2021   BUN 12 01/20/2021   NA 142 01/20/2021   K 4.6 01/20/2021   CL 104 01/20/2021   CO2 23 01/20/2021   Lab Results  Component Value Date   ALT 19 01/20/2021   AST 23 01/20/2021   ALKPHOS 54 01/20/2021   BILITOT 0.4 01/20/2021   Lab Results  Component Value Date   HGBA1C 5.8 (H) 01/20/2021   HGBA1C 5.7 (H) 10/21/2020   HGBA1C 5.7 (H) 06/30/2020   HGBA1C 5.6 11/26/2019   Lab Results  Component Value Date   INSULIN 8.7 01/20/2021   INSULIN 10.3 10/21/2020   INSULIN 11.0 06/30/2020   INSULIN 12.8 11/26/2019   Lab Results  Component Value Date   TSH 4.380 11/26/2019   Lab Results  Component Value Date   CHOL 188 01/20/2021   HDL 64 01/20/2021   LDLCALC 112 (H) 01/20/2021   LDLDIRECT 145.5 10/14/2011   TRIG 66 01/20/2021   CHOLHDL 2.9 01/20/2021   Lab Results  Component Value Date   WBC 6.0 11/26/2019   HGB 13.5 11/26/2019   HCT 40.9 11/26/2019   MCV 91 11/26/2019   PLT 172 11/26/2019    Attestation Statements:   Reviewed by clinician on day of visit: allergies, medications, problem list, medical history, surgical history, family history, social history, and previous encounter  notes.  Coral Ceo, CMA, am acting as transcriptionist for Mina Marble, NP.  I have reviewed the above documentation for accuracy and completeness, and I agree with the above. -  Hubert Raatz d. Aranda Bihm, NP-C

## 2021-04-01 LAB — COMPREHENSIVE METABOLIC PANEL
ALT: 19 IU/L (ref 0–44)
AST: 22 IU/L (ref 0–40)
Albumin/Globulin Ratio: 1.4 (ref 1.2–2.2)
Albumin: 4 g/dL (ref 3.8–4.9)
Alkaline Phosphatase: 43 IU/L — ABNORMAL LOW (ref 44–121)
BUN/Creatinine Ratio: 7 — ABNORMAL LOW (ref 9–20)
BUN: 8 mg/dL (ref 6–24)
Bilirubin Total: 0.3 mg/dL (ref 0.0–1.2)
CO2: 23 mmol/L (ref 20–29)
Calcium: 8.6 mg/dL — ABNORMAL LOW (ref 8.7–10.2)
Chloride: 102 mmol/L (ref 96–106)
Creatinine, Ser: 1.08 mg/dL (ref 0.76–1.27)
Globulin, Total: 2.8 g/dL (ref 1.5–4.5)
Glucose: 85 mg/dL (ref 65–99)
Potassium: 4.3 mmol/L (ref 3.5–5.2)
Sodium: 139 mmol/L (ref 134–144)
Total Protein: 6.8 g/dL (ref 6.0–8.5)
eGFR: 80 mL/min/{1.73_m2} (ref >59)

## 2021-04-01 LAB — LIPID PANEL
Chol/HDL Ratio: 3 ratio (ref 0.0–5.0)
Cholesterol, Total: 173 mg/dL (ref 100–199)
HDL: 58 mg/dL (ref >39)
LDL Chol Calc (NIH): 99 mg/dL (ref 0–99)
Triglycerides: 85 mg/dL (ref 0–149)
VLDL Cholesterol Cal: 16 mg/dL (ref 5–40)

## 2021-04-26 ENCOUNTER — Other Ambulatory Visit (HOSPITAL_COMMUNITY): Payer: Self-pay

## 2021-04-28 ENCOUNTER — Ambulatory Visit (INDEPENDENT_AMBULATORY_CARE_PROVIDER_SITE_OTHER): Payer: BC Managed Care – PPO | Admitting: Adult Health

## 2021-04-28 ENCOUNTER — Encounter (INDEPENDENT_AMBULATORY_CARE_PROVIDER_SITE_OTHER): Payer: Self-pay | Admitting: Adult Health

## 2021-04-28 ENCOUNTER — Other Ambulatory Visit: Payer: Self-pay

## 2021-04-28 VITALS — BP 123/82 | HR 63 | Temp 98.2°F | Ht 73.0 in | Wt 272.0 lb

## 2021-04-28 DIAGNOSIS — R7303 Prediabetes: Secondary | ICD-10-CM

## 2021-04-28 DIAGNOSIS — I10 Essential (primary) hypertension: Secondary | ICD-10-CM | POA: Diagnosis not present

## 2021-04-28 DIAGNOSIS — Z6835 Body mass index (BMI) 35.0-35.9, adult: Secondary | ICD-10-CM

## 2021-04-28 DIAGNOSIS — Z9189 Other specified personal risk factors, not elsewhere classified: Secondary | ICD-10-CM

## 2021-04-28 DIAGNOSIS — E559 Vitamin D deficiency, unspecified: Secondary | ICD-10-CM

## 2021-04-28 DIAGNOSIS — E7849 Other hyperlipidemia: Secondary | ICD-10-CM

## 2021-04-28 DIAGNOSIS — E66812 Obesity, class 2: Secondary | ICD-10-CM

## 2021-04-28 MED ORDER — ROSUVASTATIN CALCIUM 10 MG PO TABS: ORAL_TABLET | ORAL | 0 refills | Status: DC

## 2021-04-28 MED ORDER — ROSUVASTATIN CALCIUM 10 MG PO TABS: 10.0000 mg | ORAL_TABLET | ORAL | 0 refills | Status: DC

## 2021-04-29 ENCOUNTER — Other Ambulatory Visit (INDEPENDENT_AMBULATORY_CARE_PROVIDER_SITE_OTHER): Payer: Self-pay | Admitting: Adult Health

## 2021-04-29 LAB — HEMOGLOBIN A1C
Est. average glucose Bld gHb Est-mCnc: 114 mg/dL
Hgb A1c MFr Bld: 5.6 % (ref 4.8–5.6)

## 2021-04-29 LAB — VITAMIN D 25 HYDROXY (VIT D DEFICIENCY, FRACTURES): Vit D, 25-Hydroxy: 48.4 ng/mL (ref 30.0–100.0)

## 2021-04-29 LAB — INSULIN, RANDOM: INSULIN: 8.8 u[IU]/mL (ref 2.6–24.9)

## 2021-04-29 NOTE — Telephone Encounter (Signed)
Last seen by Mina Marble, NP

## 2021-05-03 ENCOUNTER — Telehealth (INDEPENDENT_AMBULATORY_CARE_PROVIDER_SITE_OTHER): Payer: Self-pay | Admitting: Adult Health

## 2021-05-03 NOTE — Telephone Encounter (Signed)
Patient is calling about rosuvastatin prescription Patient is requesting to speak to [facility], patient stated that he is still having trouble getting his medications. Patient stated is suppose to take medication 3 times a day and only received 12 pills from pharmacy when script was written for 40 pills. Patient no longer wants to communicate by mychart, he wants to speak to someone directly in the office because his medication keeps getting denied.

## 2021-05-04 ENCOUNTER — Encounter (INDEPENDENT_AMBULATORY_CARE_PROVIDER_SITE_OTHER): Payer: Self-pay | Admitting: Adult Health

## 2021-05-04 NOTE — Telephone Encounter (Addendum)
Edward Cloud, LPN      1:71 AM Note Contacted pt's pharmacy and pharmacist stated that the insurance denied coverage for all 40 pills because it was more than a 90 day supply. Pt was given 12 pills, which is a 30 day supply, but pt can also get an additional 24 pills of the remaining 28 out of the original 40 prescribed today at no additional cost to complete a 90 day supply. Pt was contacted and given this information and he verbalized understanding.      Called back and spoke with patient.  He has already spoke with Edward Mccormick, documentation above.

## 2021-05-04 NOTE — Telephone Encounter (Signed)
Contacted pt's pharmacy and pharmacist stated that the insurance denied coverage for all 40 pills because it was more than a 90 day supply. Pt was given 12 pills, which is a 30 day supply, but pt can also get an additional 24 pills of the remaining 28 out of the original 40 prescribed today at no additional cost to complete a 90 day supply.  Pt was contacted and given this information and he verbalized understanding.

## 2021-05-04 NOTE — Progress Notes (Signed)
Chief Complaint:   OBESITY Edward Mccormick is here to discuss his progress with his obesity treatment plan along with follow-up of his obesity related diagnoses. Edward Mccormick is on practicing portion control and making smarter food choices, such as increasing vegetables and decreasing simple carbohydrates and states he is following his eating plan approximately 75% of the time. Edward Mccormick states he is not currently exercising.   Today's visit was #: 31 Starting weight: 295 lbs Starting date: 11/26/2019 Today's weight: 272 lbs Today's date: 04/28/2021 Total lbs lost to date: 23 Total lbs lost since last in-office visit: 0  Interim History: Edward Mccormick has been out of Crestor 10 mg twice a week for >4 weeks due to confusion over which pharmacy to send Rx to.  Subjective:   1. Other hyperlipidemia Edward Mccormick was started on Crestor 10 mg twice a week on 02/10/2021.  Lipid panel- total 173, HDL 58, triglycerides 85, and LDL 99. ASCVD risk 11%- still above goal, however levels much improved since last lipid panel with statin therapy.  Lab Results  Component Value Date   CHOL 173 03/31/2021   HDL 58 03/31/2021   LDLCALC 99 03/31/2021   LDLDIRECT 145.5 10/14/2011   TRIG 85 03/31/2021   CHOLHDL 3.0 03/31/2021   Lab Results  Component Value Date   ALT 19 03/31/2021   AST 22 03/31/2021   ALKPHOS 43 (L) 03/31/2021   BILITOT 0.3 03/31/2021   The 10-year ASCVD risk score Edward Edward DC Jr., et al., 2013) is: 11%   Values used to calculate the score:     Age: 60 years     Sex: Male     Is Non-Hispanic African American: Yes     Diabetic: No     Tobacco smoker: No     Systolic Blood Pressure: 893 mmHg     Is BP treated: Yes     HDL Cholesterol: 58 mg/dL     Total Cholesterol: 173 mg/dL  2. Essential hypertension BP/HR at goal at OV.  Ambulatory readings SBP 120's and DBP 80's.  Brodi is on losartan 100 mg QD.  BP Readings from Last 3 Encounters:  04/28/21 123/82  03/31/21 115/76  03/03/21 112/74   3. Vitamin  D deficiency Edward Mccormick's Vitamin D level was 62.3 on 01/20/2021. He is currently taking OTC vitamin D 1,000 IU each day. He denies nausea, vomiting or muscle weakness.  4. Pre-diabetes 01/20/2021 A1c 5.8 with normal BG 86 and slightly elevated insulin level 8.7.  He is not on Metformin or any BG lowering Rx's.  Lab Results  Component Value Date   HGBA1C 5.6 04/28/2021   Lab Results  Component Value Date   INSULIN 8.8 04/28/2021   INSULIN 8.7 01/20/2021   INSULIN 10.3 10/21/2020   INSULIN 11.0 06/30/2020   INSULIN 12.8 11/26/2019   5. At risk for heart disease Edward Mccormick is at a higher than average risk for cardiovascular disease due to obesity, hyperlipidemia, and hypertension.   Assessment/Plan:   1. Other hyperlipidemia Cardiovascular risk and specific lipid/LDL goals reviewed.  We discussed several lifestyle modifications today and Edward Mccormick will continue to work on diet, exercise and weight loss efforts. Orders and follow up as documented in patient record.  -Check labs today.  Counseling Intensive lifestyle modifications are the first line treatment for this issue. Dietary changes: Increase soluble fiber. Decrease simple carbohydrates. Exercise changes: Moderate to vigorous-intensity aerobic activity 150 minutes per week if tolerated. Lipid-lowering medications: see documented in medical record.  - rosuvastatin (CRESTOR) 10 MG  tablet; Take 1 tablet (10 mg total) by mouth 3 (three) times a week.  Dispense: 40 tablet; Refill: 0  2. Essential hypertension Edward Mccormick is working on healthy weight loss and exercise to improve blood pressure control. We will watch for signs of hypotension as he continues his lifestyle modifications. -Check labs today.  3. Vitamin D deficiency Low Vitamin D level contributes to fatigue and are associated with obesity, breast, and colon cancer. He agrees to continue to take OTC  Vitamin D @1 ,000 IU QD and will follow-up for routine testing of Vitamin D, at least 2-3  times per year to avoid over-replacement. -Check labs today.  - VITAMIN D 25 Hydroxy (Vit-D Deficiency, Fractures)  4. Pre-diabetes Edward Mccormick will continue to work on weight loss, exercise, and decreasing simple carbohydrates to help decrease the risk of diabetes.  -Check labs today.  - Hemoglobin A1c - Insulin, random  5. At risk for heart disease Edward Mccormick was given approximately 15 minutes of coronary artery disease prevention counseling today. He is 60 y.o. male and has risk factors for heart disease including obesity. We discussed intensive lifestyle modifications today with an emphasis on specific weight loss instructions and strategies.   Repetitive spaced learning was employed today to elicit superior memory formation and behavioral change.  6. Class 2 severe obesity with serious comorbidity and body mass index (BMI) of 35.0 to 35.9 in adult, unspecified obesity type [facility])  Edward Mccormick is currently in the action stage of change. As such, his goal is to continue with weight loss efforts. He has agreed to practicing portion control and making smarter food choices, such as increasing vegetables and decreasing simple carbohydrates.   Exercise goals: House renovation/repairs  Behavioral modification strategies: increasing lean protein intake, decreasing simple carbohydrates, meal planning and cooking strategies, keeping healthy foods in the home, better snacking choices, and planning for success.  Edward Mccormick has agreed to follow-up with our clinic in 4 weeks. He was informed of the importance of frequent follow-up visits to maximize his success with intensive lifestyle modifications for his multiple health conditions.   Objective:   Blood pressure 123/82, pulse 63, temperature 98.2 F (36.8 C), height 6\' 1"  (1.854 m), weight 272 lb (123.4 kg), SpO2 100 %. Body mass index is 35.89 kg/m.  General: Cooperative, alert, well developed, in no acute distress. HEENT: Conjunctivae and lids  unremarkable. Cardiovascular: Regular rhythm.  Lungs: Normal work of breathing. Neurologic: No focal deficits.   Lab Results  Component Value Date   CREATININE 1.08 03/31/2021   BUN 8 03/31/2021   NA 139 03/31/2021   K 4.3 03/31/2021   CL 102 03/31/2021   CO2 23 03/31/2021   Lab Results  Component Value Date   ALT 19 03/31/2021   AST 22 03/31/2021   ALKPHOS 43 (L) 03/31/2021   BILITOT 0.3 03/31/2021   Lab Results  Component Value Date   HGBA1C 5.6 04/28/2021   HGBA1C 5.8 (H) 01/20/2021   HGBA1C 5.7 (H) 10/21/2020   HGBA1C 5.7 (H) 06/30/2020   HGBA1C 5.6 11/26/2019   Lab Results  Component Value Date   INSULIN 8.8 04/28/2021   INSULIN 8.7 01/20/2021   INSULIN 10.3 10/21/2020   INSULIN 11.0 06/30/2020   INSULIN 12.8 11/26/2019   Lab Results  Component Value Date   TSH 4.380 11/26/2019   Lab Results  Component Value Date   CHOL 173 03/31/2021   HDL 58 03/31/2021   LDLCALC 99 03/31/2021   LDLDIRECT 145.5 10/14/2011   TRIG 85 03/31/2021  CHOLHDL 3.0 03/31/2021   Lab Results  Component Value Date   WBC 6.0 11/26/2019   HGB 13.5 11/26/2019   HCT 40.9 11/26/2019   MCV 91 11/26/2019   PLT 172 11/26/2019   No results found for: IRON, TIBC, FERRITIN   Attestation Statements:   Reviewed by clinician on day of visit: allergies, medications, problem list, medical history, surgical history, family history, social history, and previous encounter notes.  Coral Ceo, CMA, am acting as transcriptionist for Mina Marble, NP.  I have reviewed the above documentation for accuracy and completeness, and I agree with the above. -  Aceton Kinnear d. Anay Walter, NP-C

## 2021-05-04 NOTE — Telephone Encounter (Signed)
Call to patient, no answer.  Left message for patient to call the clinic and ask for [place]. Also, encouraged pt to check his my chart message and let us know that he received the message.

## 2021-05-05 ENCOUNTER — Other Ambulatory Visit (INDEPENDENT_AMBULATORY_CARE_PROVIDER_SITE_OTHER): Payer: Self-pay | Admitting: Adult Health

## 2021-05-05 DIAGNOSIS — E7849 Other hyperlipidemia: Secondary | ICD-10-CM

## 2021-06-02 ENCOUNTER — Ambulatory Visit (INDEPENDENT_AMBULATORY_CARE_PROVIDER_SITE_OTHER): Payer: BC Managed Care – PPO | Admitting: Adult Health

## 2021-06-23 ENCOUNTER — Encounter (INDEPENDENT_AMBULATORY_CARE_PROVIDER_SITE_OTHER): Payer: Self-pay | Admitting: Adult Health

## 2021-06-23 ENCOUNTER — Ambulatory Visit (INDEPENDENT_AMBULATORY_CARE_PROVIDER_SITE_OTHER): Payer: BC Managed Care – PPO | Admitting: Adult Health

## 2021-06-23 ENCOUNTER — Other Ambulatory Visit: Payer: Self-pay

## 2021-06-23 VITALS — BP 140/90 | HR 66 | Temp 98.6°F | Ht 73.0 in | Wt 272.0 lb

## 2021-06-23 DIAGNOSIS — R7303 Prediabetes: Secondary | ICD-10-CM | POA: Diagnosis not present

## 2021-06-23 DIAGNOSIS — I10 Essential (primary) hypertension: Secondary | ICD-10-CM

## 2021-06-23 DIAGNOSIS — E559 Vitamin D deficiency, unspecified: Secondary | ICD-10-CM

## 2021-06-23 DIAGNOSIS — Z9189 Other specified personal risk factors, not elsewhere classified: Secondary | ICD-10-CM | POA: Diagnosis not present

## 2021-06-23 DIAGNOSIS — E7849 Other hyperlipidemia: Secondary | ICD-10-CM

## 2021-06-23 DIAGNOSIS — E66812 Obesity, class 2: Secondary | ICD-10-CM

## 2021-06-23 DIAGNOSIS — E782 Mixed hyperlipidemia: Secondary | ICD-10-CM

## 2021-06-23 DIAGNOSIS — Z6835 Body mass index (BMI) 35.0-35.9, adult: Secondary | ICD-10-CM

## 2021-06-23 MED ORDER — ROSUVASTATIN CALCIUM 10 MG PO TABS: 10.0000 mg | ORAL_TABLET | ORAL | 0 refills | Status: DC

## 2021-06-23 MED ORDER — LOSARTAN POTASSIUM 100 MG PO TABS: 100.0000 mg | ORAL_TABLET | Freq: Every day | ORAL | 0 refills | Status: DC

## 2021-06-24 ENCOUNTER — Encounter (INDEPENDENT_AMBULATORY_CARE_PROVIDER_SITE_OTHER): Payer: Self-pay | Admitting: Adult Health

## 2021-06-24 LAB — LIPID PANEL
Chol/HDL Ratio: 2.4 ratio (ref 0.0–5.0)
Cholesterol, Total: 139 mg/dL (ref 100–199)
HDL: 57 mg/dL (ref >39)
LDL Chol Calc (NIH): 70 mg/dL (ref 0–99)
Triglycerides: 59 mg/dL (ref 0–149)
VLDL Cholesterol Cal: 12 mg/dL (ref 5–40)

## 2021-06-24 LAB — COMPREHENSIVE METABOLIC PANEL
ALT: 20 IU/L (ref 0–44)
AST: 27 IU/L (ref 0–40)
Albumin/Globulin Ratio: 1.7 (ref 1.2–2.2)
Albumin: 4.1 g/dL (ref 3.8–4.9)
Alkaline Phosphatase: 48 IU/L (ref 44–121)
BUN/Creatinine Ratio: 9 (ref 9–20)
BUN: 10 mg/dL (ref 6–24)
Bilirubin Total: 0.6 mg/dL (ref 0.0–1.2)
CO2: 25 mmol/L (ref 20–29)
Calcium: 9.2 mg/dL (ref 8.7–10.2)
Chloride: 105 mmol/L (ref 96–106)
Creatinine, Ser: 1.08 mg/dL (ref 0.76–1.27)
Globulin, Total: 2.4 g/dL (ref 1.5–4.5)
Glucose: 86 mg/dL (ref 65–99)
Potassium: 4.5 mmol/L (ref 3.5–5.2)
Sodium: 140 mmol/L (ref 134–144)
Total Protein: 6.5 g/dL (ref 6.0–8.5)
eGFR: 79 mL/min/{1.73_m2} (ref >59)

## 2021-06-28 NOTE — Progress Notes (Signed)
Chief Complaint:   OBESITY Edward Mccormick is here to discuss his progress with his obesity treatment plan along with follow-up of his obesity related diagnoses. Edward Mccormick is on practicing portion control and making smarter food choices, such as increasing vegetables and decreasing simple carbohydrates and states he is following his eating plan approximately 70% of the time. Edward Mccormick states he is not exercising at this time.  Today's visit was #: 18 Starting weight: 295 lbs Starting date: 11/26/2019 Today's weight: 272 lbs Today's date: 06/23/2021 Total lbs lost to date: 23 lb Total lbs lost since last in-office visit: 0  Interim History: Edward Mccormick continues to fast every Wednesday.  He feels that he is following PC/ >70% of the time.   In the middle of October, he is going on a [place] cruise.  Subjective:   1. Other hyperlipidemia Edward Mccormick is on Crestor 10 mg, which was increased from twice a week to 3 times per week in June 2022 due to ASCVD risk stratification still above goal.  He is taking Crestor 3 times per week - denies myalgias.  Lab Results  Component Value Date   ALT 20 06/23/2021   AST 27 06/23/2021   ALKPHOS 48 06/23/2021   BILITOT 0.6 06/23/2021   Lab Results  Component Value Date   CHOL 139 06/23/2021   HDL 57 06/23/2021   LDLCALC 70 06/23/2021   LDLDIRECT 145.5 10/14/2011   TRIG 59 06/23/2021   CHOLHDL 2.4 06/23/2021   2. Essential hypertension Blood pressure is slightly above goal at office visit.  He reports increased stress.   He is on losartan 100 mg daily.  He denies acute cardiac symptoms.  BP Readings from Last 3 Encounters:  06/23/21 140/90  04/28/21 123/82  03/31/21 115/76   3. Vitamin D deficiency Vitamin D level on 04/28/2021 was 48.4 - stable.  He is currently taking OTC vitamin D 1,000 IU each day. He denies nausea, vomiting or muscle weakness.  Lab Results  Component Value Date   VD25OH 48.4 04/28/2021   VD25OH 62.3 01/20/2021   VD25OH 57.8 10/21/2020    4. Pre-diabetes On 04/28/2021, blood glucose was 114, A1c 5.6, insulin level 8.8.  A1c improved from last check (5.8 on 01/20/2021).  He is not on any blood glucose lowering medications.  Lab Results  Component Value Date   HGBA1C 5.6 04/28/2021   Lab Results  Component Value Date   INSULIN 8.8 04/28/2021   INSULIN 8.7 01/20/2021   INSULIN 10.3 10/21/2020   INSULIN 11.0 06/30/2020   INSULIN 12.8 11/26/2019   5. At risk for heart disease Edward Mccormick is at a higher than average risk for cardiovascular disease due to obesity and hyperlipidemia.    Assessment/Plan:   1. Other hyperlipidemia Cardiovascular risk and specific lipid/LDL goals reviewed.  We discussed several lifestyle modifications today and Levere will continue to work on diet, exercise and weight loss efforts. Orders and follow up as documented in patient record.  Check labs today.  Counseling Intensive lifestyle modifications are the first line treatment for this issue. Dietary changes: Increase soluble fiber. Decrease simple carbohydrates. Exercise changes: Moderate to vigorous-intensity aerobic activity 150 minutes per week if tolerated. Lipid-lowering medications: see documented in medical record.  - Comprehensive metabolic panel - Lipid panel - Refill rosuvastatin (CRESTOR) 10 MG tablet; Take 1 tablet (10 mg total) by mouth 3 (three) times a week.  Dispense: 36 tablet; Refill: 0  2. Essential hypertension Natthan is working on healthy weight loss and exercise to  improve blood pressure control. We will watch for signs of hypotension as he continues his lifestyle modifications.  Check labs today.  - Refill losartan (COZAAR) 100 MG tablet; Take 1 tablet (100 mg total) by mouth daily.  Dispense: 90 tablet; Refill: 0  3. Vitamin D deficiency Discussed labs with patient today.  Low Vitamin D level contributes to fatigue and are associated with obesity, breast, and colon cancer. He agrees to continue to take OTC vitamin D 1,000  IU daily and will follow-up for routine testing of Vitamin D, at least 2-3 times per year to avoid over-replacement.  4. Pre-diabetes Discussed labs with patient today.  Zahn will continue to work on weight loss, exercise, and decreasing simple carbohydrates to help decrease the risk of diabetes.  Will increase protein and regular activity.  5. At risk for heart disease Edward Mccormick was given approximately 15 minutes of coronary artery disease prevention counseling today. He is 60 y.o. male and has risk factors for heart disease including obesity. We discussed intensive lifestyle modifications today with an emphasis on specific weight loss instructions and strategies.   Repetitive spaced learning was employed today to elicit superior memory formation and behavioral change.  6. Class 2 severe obesity with serious comorbidity and body mass index (BMI) of 35.0 to 35.9 in adult, unspecified obesity type [facility])  Edward Mccormick is currently in the action stage of change. As such, his goal is to continue with weight loss efforts. He has agreed to practicing portion control and making smarter food choices, such as increasing vegetables and decreasing simple carbohydrates.   Exercise goals:  Light movement.  Behavioral modification strategies: increasing lean protein intake, decreasing simple carbohydrates, meal planning and cooking strategies, keeping healthy foods in the home, and planning for success.  Colden has agreed to follow-up with our clinic in 6 weeks. He was informed of the importance of frequent follow-up visits to maximize his success with intensive lifestyle modifications for his multiple health conditions.   Edward Mccormick was informed we would discuss his lab results at his next visit unless there is a critical issue that needs to be addressed sooner. Edward Mccormick agreed to keep his next visit at the agreed upon time to discuss these results.  Objective:   Blood pressure 140/90, pulse 66, temperature 98.6 F (37 C),  height '6\' 1"'$  (1.854 m), weight 272 lb (123.4 kg), SpO2 99 %. Body mass index is 35.89 kg/m.  General: Cooperative, alert, well developed, in no acute distress. HEENT: Conjunctivae and lids unremarkable. Cardiovascular: Regular rhythm.  Lungs: Normal work of breathing. Neurologic: No focal deficits.   Lab Results  Component Value Date   CREATININE 1.08 06/23/2021   BUN 10 06/23/2021   NA 140 06/23/2021   K 4.5 06/23/2021   CL 105 06/23/2021   CO2 25 06/23/2021   Lab Results  Component Value Date   ALT 20 06/23/2021   AST 27 06/23/2021   ALKPHOS 48 06/23/2021   BILITOT 0.6 06/23/2021   Lab Results  Component Value Date   HGBA1C 5.6 04/28/2021   HGBA1C 5.8 (H) 01/20/2021   HGBA1C 5.7 (H) 10/21/2020   HGBA1C 5.7 (H) 06/30/2020   HGBA1C 5.6 11/26/2019   Lab Results  Component Value Date   INSULIN 8.8 04/28/2021   INSULIN 8.7 01/20/2021   INSULIN 10.3 10/21/2020   INSULIN 11.0 06/30/2020   INSULIN 12.8 11/26/2019   Lab Results  Component Value Date   TSH 4.380 11/26/2019   Lab Results  Component Value Date  CHOL 139 06/23/2021   HDL 57 06/23/2021   LDLCALC 70 06/23/2021   LDLDIRECT 145.5 10/14/2011   TRIG 59 06/23/2021   CHOLHDL 2.4 06/23/2021   Lab Results  Component Value Date   VD25OH 48.4 04/28/2021   VD25OH 62.3 01/20/2021   VD25OH 57.8 10/21/2020   Lab Results  Component Value Date   WBC 6.0 11/26/2019   HGB 13.5 11/26/2019   HCT 40.9 11/26/2019   MCV 91 11/26/2019   PLT 172 11/26/2019   Attestation Statements:   Reviewed by clinician on day of visit: allergies, medications, problem list, medical history, surgical history, family history, social history, and previous encounter notes.  I, Water quality scientist, CMA, am acting as Location manager for Mina Marble, NP.  I have reviewed the above documentation for accuracy and completeness, and I agree with the above. -  Nithya Meriweather d. Keilah Lemire, NP-C

## 2021-08-04 ENCOUNTER — Other Ambulatory Visit: Payer: Self-pay

## 2021-08-04 ENCOUNTER — Ambulatory Visit (INDEPENDENT_AMBULATORY_CARE_PROVIDER_SITE_OTHER): Payer: BC Managed Care – PPO | Admitting: Adult Health

## 2021-08-04 ENCOUNTER — Encounter (INDEPENDENT_AMBULATORY_CARE_PROVIDER_SITE_OTHER): Payer: Self-pay | Admitting: Adult Health

## 2021-08-04 VITALS — BP 129/90 | HR 71 | Temp 98.3°F | Ht 73.0 in | Wt 271.0 lb

## 2021-08-04 DIAGNOSIS — Z9189 Other specified personal risk factors, not elsewhere classified: Secondary | ICD-10-CM | POA: Diagnosis not present

## 2021-08-04 DIAGNOSIS — E559 Vitamin D deficiency, unspecified: Secondary | ICD-10-CM

## 2021-08-04 DIAGNOSIS — E7849 Other hyperlipidemia: Secondary | ICD-10-CM | POA: Diagnosis not present

## 2021-08-04 DIAGNOSIS — Z6839 Body mass index (BMI) 39.0-39.9, adult: Secondary | ICD-10-CM

## 2021-08-04 DIAGNOSIS — E66812 Obesity, class 2: Secondary | ICD-10-CM

## 2021-08-04 NOTE — Progress Notes (Signed)
Chief Complaint:   OBESITY Edward Mccormick is here to discuss his progress with his obesity treatment plan along with follow-up of his obesity related diagnoses. Edward Mccormick is on practicing portion control and making smarter food choices, such as increasing vegetables and decreasing simple carbohydrates and states he is following his eating plan approximately 70% of the time. Edward Mccormick states he is not exercising regularly.  Today's visit was #: 41 Starting weight: 295 lbs Starting date: 11/26/2019 Today's weight: 271 lbs Today's date: 08/04/2021 Total lbs lost to date: 24 lbs Total lbs lost since last in-office visit: 1 lb  Interim History: Edward Mccormick' focus is to maintain his current weight during increased levels of stress.   He will utilize PC/[place] over the next 5-6 weeks, then upon return, he would like to focus on weight loss.  Of note:  He has been on the Category 3 meal plan.  Subjective:   1. Other hyperlipidemia On 02/10/2021, he was started on statin therapy and has been titrated up to Crestor 10 mg three times per week.   On 06/23/2021, lipid panel - at goal, CMP showed LFTs - WNL.  Lab Results  Component Value Date   ALT 20 06/23/2021   AST 27 06/23/2021   ALKPHOS 48 06/23/2021   BILITOT 0.6 06/23/2021   Lab Results  Component Value Date   CHOL 139 06/23/2021   HDL 57 06/23/2021   LDLCALC 70 06/23/2021   LDLDIRECT 145.5 10/14/2011   TRIG 59 06/23/2021   CHOLHDL 2.4 06/23/2021   2. Vitamin D deficiency Vitamin D level on 04/28/2021 - 48.4. He is on OTC vitamin D3.  Lab Results  Component Value Date   VD25OH 48.4 04/28/2021   VD25OH 62.3 01/20/2021   VD25OH 57.8 10/21/2020   3. At risk for heart disease Edward Mccormick is at a higher than average risk for cardiovascular disease due to hyperlipidemia, hypertension, and obesity.    Assessment/Plan:   1. Other hyperlipidemia Discussed labs with patient today.  Continue Crestor 10 mg three times per week.  2. Vitamin D  deficiency Discussed labs with patient today.  Will send MyChart message with current dose.  Check labs every 3 months.  3. At risk for heart disease Edward Mccormick was given approximately 15 minutes of coronary artery disease prevention counseling today. He is 60 y.o. male and has risk factors for heart disease including obesity. We discussed intensive lifestyle modifications today with an emphasis on specific weight loss instructions and strategies.   Repetitive spaced learning was employed today to elicit superior memory formation and behavioral change.   4. Obesity with BMI 35.8  Edward Mccormick is currently in the action stage of change. As such, his goal is to continue with weight loss efforts. He has agreed to practicing portion control and making smarter food choices, such as increasing vegetables and decreasing simple carbohydrates.   Exercise goals:  As is.  Behavioral modification strategies: increasing lean protein intake, decreasing simple carbohydrates, meal planning and cooking strategies, keeping healthy foods in the home, and planning for success.  Handout:  High Protein/Low Calorie Food List; Protein Content of Foods.  Edward Mccormick has agreed to follow-up with our clinic in 5-6 weeks. He was informed of the importance of frequent follow-up visits to maximize his success with intensive lifestyle modifications for his multiple health conditions.   Objective:   Blood pressure 129/90, pulse 71, temperature 98.3 F (36.8 C), height '6\' 1"'$  (1.854 m), weight 271 lb (122.9 kg), SpO2 98 %. Body mass index  is 35.75 kg/m.  General: Cooperative, alert, well developed, in no acute distress. HEENT: Conjunctivae and lids unremarkable. Cardiovascular: Regular rhythm.  Lungs: Normal work of breathing. Neurologic: No focal deficits.   Lab Results  Component Value Date   CREATININE 1.08 06/23/2021   BUN 10 06/23/2021   NA 140 06/23/2021   K 4.5 06/23/2021   CL 105 06/23/2021   CO2 25 06/23/2021   Lab  Results  Component Value Date   ALT 20 06/23/2021   AST 27 06/23/2021   ALKPHOS 48 06/23/2021   BILITOT 0.6 06/23/2021   Lab Results  Component Value Date   HGBA1C 5.6 04/28/2021   HGBA1C 5.8 (H) 01/20/2021   HGBA1C 5.7 (H) 10/21/2020   HGBA1C 5.7 (H) 06/30/2020   HGBA1C 5.6 11/26/2019   Lab Results  Component Value Date   INSULIN 8.8 04/28/2021   INSULIN 8.7 01/20/2021   INSULIN 10.3 10/21/2020   INSULIN 11.0 06/30/2020   INSULIN 12.8 11/26/2019   Lab Results  Component Value Date   TSH 4.380 11/26/2019   Lab Results  Component Value Date   CHOL 139 06/23/2021   HDL 57 06/23/2021   LDLCALC 70 06/23/2021   LDLDIRECT 145.5 10/14/2011   TRIG 59 06/23/2021   CHOLHDL 2.4 06/23/2021   Lab Results  Component Value Date   VD25OH 48.4 04/28/2021   VD25OH 62.3 01/20/2021   VD25OH 57.8 10/21/2020   Lab Results  Component Value Date   WBC 6.0 11/26/2019   HGB 13.5 11/26/2019   HCT 40.9 11/26/2019   MCV 91 11/26/2019   PLT 172 11/26/2019   Attestation Statements:   Reviewed by clinician on day of visit: allergies, medications, problem list, medical history, surgical history, family history, social history, and previous encounter notes.  I, Water quality scientist, CMA, am acting as Location manager for Mina Marble, NP.  I have reviewed the above documentation for accuracy and completeness, and I agree with the above. -  Jennice Renegar d. Joclyn Alsobrook, NP-C

## 2021-09-15 ENCOUNTER — Ambulatory Visit (INDEPENDENT_AMBULATORY_CARE_PROVIDER_SITE_OTHER): Payer: BC Managed Care – PPO | Admitting: Adult Health

## 2021-09-18 ENCOUNTER — Other Ambulatory Visit (INDEPENDENT_AMBULATORY_CARE_PROVIDER_SITE_OTHER): Payer: Self-pay | Admitting: Adult Health

## 2021-09-18 DIAGNOSIS — I10 Essential (primary) hypertension: Secondary | ICD-10-CM

## 2021-09-20 NOTE — Telephone Encounter (Signed)
LAST APPOINTMENT DATE: 08/04/21 NEXT APPOINTMENT DATE: 09/29/21   [place] ([place]), [place] [address] [place] ([place] [address] Phone: [phone] Fax: [phone]  [id number] - [place], [address] [place] [address] Phone: [phone] Fax: [phone]  Patient is requesting a refill of the following medications: Pending Prescriptions:                       Disp   Refills   losartan (COZAAR) 100 MG tablet [Pharmacy *90 tab*0       Sig: TAKE 1 TABLET BY MOUTH EVERY DAY   Date last filled: 06/23/21 Previously prescribed by [facility]  Lab Results      Component                Value               Date                      HGBA1C                   5.6                 04/28/2021                HGBA1C                   5.8 (H)             01/20/2021                HGBA1C                   5.7 (H)             10/21/2020           Lab Results      Component                Value               Date                      LDLCALC                  70                  06/23/2021                CREATININE               1.08                06/23/2021           Lab Results      Component                Value               Date                      VD25OH                   48.4                04/28/2021                VD25OH  62.3                01/20/2021                VD25OH                   57.8                10/21/2020            BP Readings from Last 3 Encounters: 08/04/21 : 129/90 06/23/21 : 140/90 04/28/21 : 123/82

## 2021-09-20 NOTE — Telephone Encounter (Signed)
Last seen by Katy 

## 2021-09-28 ENCOUNTER — Encounter (INDEPENDENT_AMBULATORY_CARE_PROVIDER_SITE_OTHER): Payer: Self-pay | Admitting: Adult Health

## 2021-09-28 ENCOUNTER — Other Ambulatory Visit (INDEPENDENT_AMBULATORY_CARE_PROVIDER_SITE_OTHER): Payer: Self-pay | Admitting: Adult Health

## 2021-09-28 DIAGNOSIS — E7849 Other hyperlipidemia: Secondary | ICD-10-CM

## 2021-09-29 ENCOUNTER — Encounter (INDEPENDENT_AMBULATORY_CARE_PROVIDER_SITE_OTHER): Payer: Self-pay | Admitting: Adult Health

## 2021-09-29 ENCOUNTER — Ambulatory Visit (INDEPENDENT_AMBULATORY_CARE_PROVIDER_SITE_OTHER): Payer: BC Managed Care – PPO | Admitting: Adult Health

## 2021-09-29 ENCOUNTER — Other Ambulatory Visit: Payer: Self-pay

## 2021-09-29 VITALS — BP 149/80 | HR 62 | Temp 98.2°F | Ht 73.0 in | Wt 280.0 lb

## 2021-09-29 DIAGNOSIS — I10 Essential (primary) hypertension: Secondary | ICD-10-CM | POA: Diagnosis not present

## 2021-09-29 DIAGNOSIS — S46811A Strain of other muscles, fascia and tendons at shoulder and upper arm level, right arm, initial encounter: Secondary | ICD-10-CM

## 2021-09-29 DIAGNOSIS — E7849 Other hyperlipidemia: Secondary | ICD-10-CM

## 2021-09-29 DIAGNOSIS — Z9189 Other specified personal risk factors, not elsewhere classified: Secondary | ICD-10-CM | POA: Diagnosis not present

## 2021-09-29 DIAGNOSIS — Z6839 Body mass index (BMI) 39.0-39.9, adult: Secondary | ICD-10-CM | POA: Diagnosis not present

## 2021-09-29 MED ORDER — LOSARTAN POTASSIUM 100 MG PO TABS: 100.0000 mg | ORAL_TABLET | Freq: Every day | ORAL | 0 refills | Status: DC

## 2021-09-29 MED ORDER — ROSUVASTATIN CALCIUM 10 MG PO TABS: 10.0000 mg | ORAL_TABLET | ORAL | 0 refills | Status: DC

## 2021-09-29 NOTE — Progress Notes (Signed)
Chief Complaint:   OBESITY Edward Mccormick is here to discuss his progress with his obesity treatment plan along with follow-up of his obesity related diagnoses. Edward Mccormick is on practicing portion control and making smarter food choices, such as increasing vegetables and decreasing simple carbohydrates and states he is following his eating plan approximately 25% of the time. Edward Mccormick states he walked on the cruise.  Today's visit was #: 20 Starting weight: 295 lbs Starting date: 11/26/2019 Today's weight: 280 lbs Today's date: 09/29/2021 Total lbs lost to date: 15 lbs Total lbs lost since last in-office visit: 0  Interim History: Edward Mccormick went on a 7 day [place] cruise. He was active with frequent walking while at sea and enjoyed the ships cuisine.  Upon return home, he experienced fatigue, pharyngitis, cough - COVID test negative. He has only a lingering, nonproductive cough at present.  Subjective:   1. Other hyperlipidemia Crestor 10 mg three times per week- last lipid panel and CMP- stable.  2. Trapezius strain, right, initial encounter 4 weeks - intermittent right trapezius pain. He denies previous accident or injury prior to onset of sx's. He denies repetitive activity (sports) prior to onset of sx's. He is R hand dominant.  3. Essential hypertension BP elevated at office visit.  He denies acute cardiac symptoms. He denies tobacco/vape use.  4. At risk for activity intolerance Edward Mccormick is at risk for activity intolerance due to right trapezius strain.  Assessment/Plan:   1. Other hyperlipidemia Check fasting labs at next office visit. Refill Crestor 10 mg three times per week.  - Refill rosuvastatin (CRESTOR) 10 MG tablet; Take 1 tablet (10 mg total) by mouth 3 (three) times a week.  Dispense: 36 tablet; Refill: 0  2. Trapezius strain, right, initial encounter Referral to Orthopedics placed today.  - AMB referral to orthopedics  3. Essential hypertension If BP is above goal at  next office visit, consider adding additional antihypertensive agent.  Refill losartan 100 mg daily.  - Refill losartan (COZAAR) 100 MG tablet; Take 1 tablet (100 mg total) by mouth daily.  Dispense: 90 tablet; Refill: 0  4. At risk for activity intolerance Edward Mccormick was given approximately 15 minutes of exercise intolerance counseling today. He is 60 y.o. male and has risk factors exercise intolerance including obesity. We discussed intensive lifestyle modifications today with an emphasis on specific weight loss instructions and strategies. Edward Mccormick will slowly increase activity as tolerated.  Repetitive spaced learning was employed today to elicit superior memory formation and behavioral change.   5. Obesity with BMI 37.0  Edward Mccormick is currently in the action stage of change. As such, his goal is to continue with weight loss efforts. He has agreed to practicing portion control and making smarter food choices, such as increasing vegetables and decreasing simple carbohydrates.   Check fasting labs at next office visit.  Exercise goals:  As is.  Behavioral modification strategies: increasing lean protein intake, decreasing simple carbohydrates, meal planning and cooking strategies, keeping healthy foods in the home, and planning for success.  Edward Mccormick has agreed to follow-up with our clinic on December 7, fasting. He was informed of the importance of frequent follow-up visits to maximize his success with intensive lifestyle modifications for his multiple health conditions.   Objective:   Blood pressure (!) 149/80, pulse 62, temperature 98.2 F (36.8 C), height 6\' 1"  (1.854 m), weight 280 lb (127 kg), SpO2 100 %. Body mass index is 36.94 kg/m.  General: Cooperative, alert, well developed, in no acute  distress. HEENT: Conjunctivae and lids unremarkable. Cardiovascular: Regular rhythm.  Lungs: Normal work of breathing. Neurologic: No focal deficits.   Lab Results  Component Value Date    CREATININE 1.08 06/23/2021   BUN 10 06/23/2021   NA 140 06/23/2021   K 4.5 06/23/2021   CL 105 06/23/2021   CO2 25 06/23/2021   Lab Results  Component Value Date   ALT 20 06/23/2021   AST 27 06/23/2021   ALKPHOS 48 06/23/2021   BILITOT 0.6 06/23/2021   Lab Results  Component Value Date   HGBA1C 5.6 04/28/2021   HGBA1C 5.8 (H) 01/20/2021   HGBA1C 5.7 (H) 10/21/2020   HGBA1C 5.7 (H) 06/30/2020   HGBA1C 5.6 11/26/2019   Lab Results  Component Value Date   INSULIN 8.8 04/28/2021   INSULIN 8.7 01/20/2021   INSULIN 10.3 10/21/2020   INSULIN 11.0 06/30/2020   INSULIN 12.8 11/26/2019   Lab Results  Component Value Date   TSH 4.380 11/26/2019   Lab Results  Component Value Date   CHOL 139 06/23/2021   HDL 57 06/23/2021   LDLCALC 70 06/23/2021   LDLDIRECT 145.5 10/14/2011   TRIG 59 06/23/2021   CHOLHDL 2.4 06/23/2021   Lab Results  Component Value Date   VD25OH 48.4 04/28/2021   VD25OH 62.3 01/20/2021   VD25OH 57.8 10/21/2020   Lab Results  Component Value Date   WBC 6.0 11/26/2019   HGB 13.5 11/26/2019   HCT 40.9 11/26/2019   MCV 91 11/26/2019   PLT 172 11/26/2019   Attestation Statements:   Reviewed by clinician on day of visit: allergies, medications, problem list, medical history, surgical history, family history, social history, and previous encounter notes.  I, Water quality scientist, CMA, am acting as Location manager for Mina Marble, NP.  I have reviewed the above documentation for accuracy and completeness, and I agree with the above. -  Oneal Biglow d. Kenith Trickel, NP-C

## 2021-09-30 ENCOUNTER — Other Ambulatory Visit (HOSPITAL_BASED_OUTPATIENT_CLINIC_OR_DEPARTMENT_OTHER): Payer: Self-pay | Admitting: Orthopaedic Surgery

## 2021-09-30 DIAGNOSIS — S46811A Strain of other muscles, fascia and tendons at shoulder and upper arm level, right arm, initial encounter: Secondary | ICD-10-CM | POA: Insufficient documentation

## 2021-09-30 DIAGNOSIS — M25511 Pain in right shoulder: Secondary | ICD-10-CM

## 2021-10-01 ENCOUNTER — Ambulatory Visit (HOSPITAL_BASED_OUTPATIENT_CLINIC_OR_DEPARTMENT_OTHER)
Admission: RE | Admit: 2021-10-01 | Discharge: 2021-10-01 | Disposition: A | Discharge status: Home / Self Care | Payer: BC Managed Care – PPO | Source: Ambulatory Visit | Attending: Orthopaedic Surgery | Admitting: Orthopaedic Surgery

## 2021-10-01 ENCOUNTER — Ambulatory Visit (INDEPENDENT_AMBULATORY_CARE_PROVIDER_SITE_OTHER): Payer: BC Managed Care – PPO | Admitting: Orthopaedic Surgery

## 2021-10-01 ENCOUNTER — Other Ambulatory Visit: Payer: Self-pay

## 2021-10-01 DIAGNOSIS — G2589 Other specified extrapyramidal and movement disorders: Secondary | ICD-10-CM

## 2021-10-01 DIAGNOSIS — M25511 Pain in right shoulder: Secondary | ICD-10-CM | POA: Insufficient documentation

## 2021-10-01 MED ORDER — LIDOCAINE HCL 1 % IJ SOLN
4.0000 mL | INTRAMUSCULAR | Status: AC | PRN
Start: 2021-10-01 — End: 2021-10-01
  Administered 2021-10-01: 4 mL

## 2021-10-01 MED ORDER — TRIAMCINOLONE ACETONIDE 40 MG/ML IJ SUSP
80.0000 mg | INTRAMUSCULAR | Status: AC | PRN
  Administered 2021-10-01: 80 mg via INTRA_ARTICULAR

## 2021-10-01 NOTE — Progress Notes (Signed)
Chief Complaint: right shoulder pain     History of Present Illness:   Edward Mccormick is a 60 y.o. male right-hand-dominant male with right scapular type pain has been going on for approximately 6 months.  He states that he felt like he might of slept on it wrong and since that time is of a deep pain in the posterior aspect of the shoulder.  He will occasionally endorse popping and cracking.  He has tried aspartame cream and Aleve which helps somewhat.  He denies any loss of motion with overhead activity.  He has been using the arm significantly recently as he has 3 daughters who needed help around the house.  He works as a Company secretary as well as Copy History:   None  PMH/PSH/Family History/Social History/Meds/Allergies:    Past Medical History:  Diagnosis Date  . Asthma   . Back pain   . Constipation   . HTN (hypertension)   . Joint pain   . Seasonal allergies   . Vitamin D deficiency    Past Surgical History:  Procedure Laterality Date  . INGUINAL HERNIA REPAIR    . PILONIDAL CYST EXCISION    . WISDOM TOOTH EXTRACTION     Social History   Socioeconomic History  . Marital status: Married    Spouse name: Edward Mccormick  . Number of children: Not on file  . Years of education: Not on file  . Highest education level: Not on file  Occupational History  . Occupation: Architect: Public house manager  Tobacco Use  . Smoking status: Never  . Smokeless tobacco: Never  Substance and Sexual Activity  . Alcohol use: No  . Drug use: No  . Sexual activity: Yes    Partners: Female    Birth control/protection: None  Other Topics Concern  . Not on file  Social History Narrative  . Not on file   Social Determinants of Health   Financial Resource Strain: Not on file  Food Insecurity: Not on file  Transportation Needs: Not on file  Physical Activity: Not on file  Stress: Not on file   Social Connections: Not on file   Family History  Problem Relation Age of Onset  . Hypertension Mother   . Anxiety disorder Mother   . Obesity Mother   . Colon cancer Neg Hx   . Stomach cancer Neg Hx   . Esophageal cancer Neg Hx   . Rectal cancer Neg Hx    No Known Allergies Current Outpatient Medications  Medication Sig Dispense Refill  . losartan (COZAAR) 100 MG tablet Take 1 tablet (100 mg total) by mouth daily. 90 tablet 0  . rosuvastatin (CRESTOR) 10 MG tablet Take 1 tablet (10 mg total) by mouth 3 (three) times a week. 36 tablet 0   No current facility-administered medications for this visit.   No results found.  Review of Systems:   A ROS was performed including pertinent positives and negatives as documented in the HPI.  Physical Exam :   Constitutional: NAD and appears stated age Neurological: Alert and oriented Psych: Appropriate affect and cooperative There were no vitals taken for this visit.   Comprehensive Musculoskeletal Exam:    Musculoskeletal Exam    Inspection Right Left  Skin No atrophy or winging No atrophy or winging  Palpation    Tenderness Posterior scapula None  Range of Motion    Flexion (passive) 170 170  Flexion (active) 170 170  Abduction 170 170  ER at the side 70 70  Can reach behind back to T12 T12  Strength     Full Full  Special Tests    Pseudoparalytic No No  Neurologic    Fires PIN, radial, median, ulnar, musculocutaneous, axillary, suprascapular, long thoracic, and spinal accessory innervated muscles. No abnormal sensibility  Vascular/Lymphatic    Radial Pulse 2+ 2+  Cervical Exam    Patient has symmetric cervical range of motion with negative Spurling's test.  Special Test: Positive for scapular winging     Imaging:   Xray (3 views right shoulder): Mild AC degeneration   I personally reviewed and interpreted the radiographs.   Assessment:   60 year old male with right subscapular bursitis in the setting of  scapular dyskinesis.  He also has some pain in the upper trap which is again consistent with trapezial compensation for his dyskinesis.  I recommended physical therapy to work on a rhomboid and lower trapezius program and to assist with posture.  I do believe he would also be a candidate for dry needling in the setting of scapular dyskinesis and trapezial pain.  I will plan to perform a subscapular ultrasound-guided injection today to help with pain and to improve his ability to perform physical therapy.  Plan :    -Right subscapular ultrasound-guided injection performed today -PT for scapular dyskinesis plan and possible dry needling    Procedure Note  Patient: Edward Mccormick             Date of Birth: 1961-04-28           MRN: [id number]             Visit Date: 10/01/2021  Procedures: Visit Diagnoses: No diagnosis found.  Large Joint Inj on 10/01/2021 2:33 PM Indications: pain Details: 22 G 1.5 in needle, ultrasound-guided anterior approach  Arthrogram: No  Medications: 4 mL lidocaine 1 %; 80 mg triamcinolone acetonide 40 MG/ML Outcome: tolerated well, no immediate complications Procedure, treatment alternatives, risks and benefits explained, specific risks discussed. Consent was given by the patient. Immediately prior to procedure a time out was called to verify the correct patient, procedure, equipment, support staff and site/side marked as required. Patient was prepped and draped in the usual sterile fashion.         I personally saw and evaluated the patient, and participated in the management and treatment plan.  Vanetta Mulders, MD Attending Physician, Orthopedic Surgery  This document was dictated using Dragon voice recognition software. A reasonable attempt at proof reading has been made to minimize errors.

## 2021-10-06 ENCOUNTER — Encounter (HOSPITAL_BASED_OUTPATIENT_CLINIC_OR_DEPARTMENT_OTHER): Payer: Self-pay | Admitting: Orthopaedic Surgery

## 2021-10-08 ENCOUNTER — Other Ambulatory Visit: Payer: Self-pay

## 2021-10-08 ENCOUNTER — Ambulatory Visit (HOSPITAL_BASED_OUTPATIENT_CLINIC_OR_DEPARTMENT_OTHER): Payer: BC Managed Care – PPO | Attending: Orthopaedic Surgery | Admitting: Physical Therapy

## 2021-10-08 ENCOUNTER — Other Ambulatory Visit (INDEPENDENT_AMBULATORY_CARE_PROVIDER_SITE_OTHER): Payer: Self-pay | Admitting: Adult Health

## 2021-10-08 ENCOUNTER — Encounter (HOSPITAL_BASED_OUTPATIENT_CLINIC_OR_DEPARTMENT_OTHER): Payer: Self-pay | Admitting: Physical Therapy

## 2021-10-08 DIAGNOSIS — G2589 Other specified extrapyramidal and movement disorders: Secondary | ICD-10-CM | POA: Diagnosis not present

## 2021-10-08 DIAGNOSIS — M25511 Pain in right shoulder: Secondary | ICD-10-CM | POA: Diagnosis present

## 2021-10-08 DIAGNOSIS — G8929 Other chronic pain: Secondary | ICD-10-CM | POA: Insufficient documentation

## 2021-10-08 DIAGNOSIS — M62838 Other muscle spasm: Secondary | ICD-10-CM | POA: Diagnosis present

## 2021-10-08 DIAGNOSIS — E7849 Other hyperlipidemia: Secondary | ICD-10-CM

## 2021-10-08 NOTE — Therapy (Signed)
OUTPATIENT PHYSICAL THERAPY SHOULDER EVALUATION   Patient Name: Edward Mccormick MRN: [id number] DOB:04-11-1961, 60 y.o., male Today's Date: 10/10/2021    Past Medical History:  Diagnosis Date   Asthma    Back pain    Constipation    HTN (hypertension)    Joint pain    Seasonal allergies    Vitamin D deficiency    Past Surgical History:  Procedure Laterality Date   INGUINAL HERNIA REPAIR     PILONIDAL CYST EXCISION     WISDOM TOOTH EXTRACTION     Patient Active Problem List   Diagnosis Date Noted   Trapezius strain, right, initial encounter 09/30/2021   Hyperglycemia 10/21/2020   Prediabetes 07/22/2020   Other hyperlipidemia 06/30/2020   Insulin resistance 01/02/2020   Vitamin D deficiency 01/02/2020   Class 2 severe obesity with serious comorbidity and body mass index (BMI) of 39.0 to 39.9 in adult ([place]) 01/02/2020   Obesity (BMI 30.0-34.9) 03/22/2017   Pure hypercholesterolemia 03/22/2017   Essential hypertension 12/22/2016    PCP: Pcp, No  REFERRING PROVIDER: Vanetta Mulders, MD  REFERRING DIAG: G25.89 (ICD-10-CM) - Scapular dyskinesis  THERAPY DIAG:  Right shoulder pain   ONSET DATE: 6 months prior   SUBJECTIVE:                                                                                                                                                                                      SUBJECTIVE STATEMENT: Patient had an insidious onset of shoulder and scapular pain that has lasted for 6 months now. The pain can become severe. It comes and goes. At times he has significant pain at times her has none. Lat night he had to put his arm up over his head to relieve his pain.   PERTINENT HISTORY: Cervical spine pain    PAIN:  Are you having pain? Yes VAS scale: 7/10 last night  Pain location: right upper trap and into the arm  Pain orientation: Right  PAIN TYPE: aching Pain description: intermittent  Aggravating factors: use of the arm; The pain can  just come on a times  Relieving factors: when it is very painful he has to hold his arm overhead.   PRECAUTIONS: None  WEIGHT BEARING RESTRICTIONS No   PLOF: IndependentIndependent  PATIENT GOALS   To have less pain   OBJECTIVE:   DIAGNOSTIC FINDINGS:  Shoulder X-ray: (-)  Cervial x-ray 2020: Mild reversal of normal cervical lordosis at C4-C5 is slightly worse than on previous exam. Degenerative changes within the cervical spine greatest at C5-C6 and C6-C7 also have worsened since the prior study. Minimal anterolisthesis of C4 on C5 is similar  to the prior exam.  PATIENT SURVEYS:  Quick Dash    COGNITION:  Overall cognitive status: Within functional limits for tasks assessed     SENSATION:  Light touch: Appears intact  Stereognosis: Appears intact  Hot/Cold: Appears intact  Proprioception: Appears intact  PALPATION:   POSTURE: Forward head  UPPER EXTREMITY AROM/PROM:  Full active motion of the shoulder without pain   Cervical spine motion:  Flexion: full  Extension: reproduction of pain with cervical extension  Right rotation WNL Left rotation WNL  Right side bending: pain into upper trap and scapular area   Right grip 80  Left grip 100   UPPER EXTREMITY MMT:  MMT Right 10/10/2021 Left 10/10/2021  Shoulder flexion 5/5 5/5  Shoulder extension    Shoulder abduction 5/5 5/5  Shoulder adduction    Shoulder extension    Middle trapezius    Lower trapezius    Elbow flexion    Elbow extension    Wrist flexion    Wrist extension    Wrist ulnar deviation    Wrist radial deviation    Wrist pronation    Wrist supination    Grip strength    (Blank rows = not tested) Shoulder ER and IR 5/5   SHOULDER SPECIAL TESTS:  Impingement tests: Hawkins/Kennedy impingement test: negative   Rotator cuff assessment: Empty can test: negative  Biceps assessment: Speed's test: negative  JOINT MOBILITY TESTING:    PALPATION:  Significant spasming in the  upper trap; cervical paraspinals and into the peri-scpular area   POSTURE:   Forward head    TODAY'S TREATMENT:  Reviewed trigger point referral patterns   Exercises Seated Upper Trapezius Stretch 3 reps - 20 hold Gentle Levator Scapulae Stretch 3 sets - 3 reps - 20 hold Theracane Over Shoulder 3 sets - 10 reps Shoulder extension with resistance 3 sets - 10 reps Scapular Retraction with Resistance  3 sets - 10 reps    PATIENT EDUCATION: Education details: reviewed HEP; trigger point referral patterns; benefits of dry needling  Person educated: Patient Education method: Explanation, Demonstration, Tactile cues, Verbal cues, and Handouts Education comprehension: verbalized understanding, returned demonstration, verbal cues required, tactile cues required, and needs further education   HOME EXERCISE PROGRAM: Access Code: [email or web address] URL: [email or web address] Date: 10/10/2021 Prepared by: Carolyne Littles  Exercises Seated Upper Trapezius Stretch - 1 x daily - 7 x weekly - 3 reps - 20 hold Gentle Levator Scapulae Stretch - 1 x daily - 7 x weekly - 3 sets - 3 reps - 20 hold Theracane Over Shoulder - 1 x daily - 7 x weekly - 3 sets - 10 reps Shoulder extension with resistance - Neutral - 1 x daily - 7 x weekly - 3 sets - 10 reps Scapular Retraction with Resistance - 1 x daily - 7 x weekly - 3 sets - 10 reps   ASSESSMENT:  CLINICAL IMPRESSION: Patient is a 60 year old male with upper trap and peri-scapular pain. He has a large trigger point in his upper trap and into the rhomboid area. His shoulder special tests were negative. His pain was reproduced with cervical extension and right side bending. He has a history of cervical dysfunction. His x-ray shows multi-level degeneration. He has decreased grip strength on the right compared to the left. He would benefit from skilled therapy to reduce trigger points through dry needling and manual therapy as well as improve strength  and reduce genral pain.   Objective impairments include decreased activity tolerance,  decreased strength, increased fascial restrictions, increased muscle spasms, and pain decreased UE . These impairments are limiting patient from cleaning, meal prep, and yard work. Personal factors including 1-2 comorbidities: Cervical spine degeneration   are also affecting patient's functional outcome. Patient will benefit from skilled PT to address above impairments and improve overall function.  REHAB POTENTIAL: Good  CLINICAL DECISION MAKING: Evolving/moderate complexity symptoms come and go and can become severe   EVALUATION COMPLEXITY: Moderate   GOALS: Goals reviewed with patient? Yes  SHORT TERM GOALS:  STG Name Target Date Goal status  1 Patient will demonstrate full cervical extension and right side bending without reproduction of pain  Baseline:  10/31/2021 INITIAL  2 Patient will reports a 50% reduction in pain intensity and frequency  Baseline:  10/31/2021 INITIAL  3 Patient will increase grip strength on the right by 10 lbs  Baseline: 80 lbs  10/31/2021 INITIAL  LONG TERM GOALS:   LTG Name Target Date Goal status  1 Patient will be independent with complete program to manage cervical and shoulder pain in the future.  Baseline: 11/21/2021 INITIAL  2 Patient will use shoulder for ADL's without pain  Baseline: 11/21/2021 INITIAL  PLAN: PT FREQUENCY: 2x/week  PT DURATION: 6 weeks  PLANNED INTERVENTIONS: Therapeutic exercises, Therapeutic activity, Neuro Muscular re-education, Balance training, Gait training, Patient/Family education, Joint mobilization, Dry Needling, Cryotherapy, Moist heat, Traction, Ultrasound, and Manual therapy  PLAN FOR NEXT SESSION: patient would like to transfer to [address] because [place] is closer to his house; begin with TPDN of the upper trap peri-scapular area; review postural exercises. Consider seated scapular series.    Carney Living PT DPT   10/10/2021, 8:05 AM

## 2021-10-11 ENCOUNTER — Other Ambulatory Visit (INDEPENDENT_AMBULATORY_CARE_PROVIDER_SITE_OTHER): Payer: Self-pay | Admitting: Adult Health

## 2021-10-11 DIAGNOSIS — I10 Essential (primary) hypertension: Secondary | ICD-10-CM

## 2021-10-11 NOTE — Telephone Encounter (Signed)
Last OV with Edward Mccormick 

## 2021-10-11 NOTE — Addendum Note (Signed)
Addended by: Carney Living on: 10/11/2021 10:01 AM   Modules accepted: Orders

## 2021-10-12 NOTE — Telephone Encounter (Signed)
Pt last seen by Katy Danford, FNP.  

## 2021-10-27 ENCOUNTER — Ambulatory Visit (INDEPENDENT_AMBULATORY_CARE_PROVIDER_SITE_OTHER): Payer: BC Managed Care – PPO | Admitting: Adult Health

## 2021-10-27 ENCOUNTER — Encounter: Payer: Self-pay | Admitting: Physical Therapy

## 2021-10-27 ENCOUNTER — Other Ambulatory Visit: Payer: Self-pay

## 2021-10-27 ENCOUNTER — Ambulatory Visit (INDEPENDENT_AMBULATORY_CARE_PROVIDER_SITE_OTHER): Payer: BC Managed Care – PPO | Admitting: Physician Assistant

## 2021-10-27 ENCOUNTER — Ambulatory Visit: Payer: BC Managed Care – PPO | Attending: Orthopaedic Surgery | Admitting: Physical Therapy

## 2021-10-27 ENCOUNTER — Encounter: Payer: Self-pay | Admitting: Physician Assistant

## 2021-10-27 VITALS — BP 137/82 | HR 82 | Temp 98.0°F | Ht 73.0 in | Wt 279.0 lb

## 2021-10-27 DIAGNOSIS — M25511 Pain in right shoulder: Secondary | ICD-10-CM | POA: Insufficient documentation

## 2021-10-27 DIAGNOSIS — R5383 Other fatigue: Secondary | ICD-10-CM

## 2021-10-27 DIAGNOSIS — I10 Essential (primary) hypertension: Secondary | ICD-10-CM | POA: Diagnosis not present

## 2021-10-27 DIAGNOSIS — E559 Vitamin D deficiency, unspecified: Secondary | ICD-10-CM

## 2021-10-27 DIAGNOSIS — E7849 Other hyperlipidemia: Secondary | ICD-10-CM

## 2021-10-27 DIAGNOSIS — Z6836 Body mass index (BMI) 36.0-36.9, adult: Secondary | ICD-10-CM

## 2021-10-27 DIAGNOSIS — Z7689 Persons encountering health services in other specified circumstances: Secondary | ICD-10-CM | POA: Diagnosis not present

## 2021-10-27 DIAGNOSIS — R7303 Prediabetes: Secondary | ICD-10-CM

## 2021-10-27 DIAGNOSIS — M62838 Other muscle spasm: Secondary | ICD-10-CM | POA: Diagnosis present

## 2021-10-27 DIAGNOSIS — G8929 Other chronic pain: Secondary | ICD-10-CM | POA: Diagnosis present

## 2021-10-27 NOTE — Patient Instructions (Signed)
Exercising to Stay Healthy °To become healthy and stay healthy, it is recommended that you do moderate-intensity and vigorous-intensity exercise. You can tell that you are exercising at a moderate intensity if your heart starts beating faster and you start breathing faster but can still hold a conversation. You can tell that you are exercising at a vigorous intensity if you are breathing much harder and faster and cannot hold a conversation while exercising. °How can exercise benefit me? °Exercising regularly is important. It has many health benefits, such as: °Improving overall fitness, flexibility, and endurance. °Increasing bone density. °Helping with weight control. °Decreasing body fat. °Increasing muscle strength and endurance. °Reducing stress and tension, anxiety, depression, or anger. °Improving overall health. °What guidelines should I follow while exercising? °Before you start a new exercise program, talk with your health care provider. °Do not exercise so much that you hurt yourself, feel dizzy, or get very short of breath. °Wear comfortable clothes and wear shoes with good support. °Drink plenty of water while you exercise to prevent dehydration or heat stroke. °Work out until your breathing and your heartbeat get faster (moderate intensity). °How often should I exercise? °Choose an activity that you enjoy, and set realistic goals. Your health care provider can help you make an activity plan that is individually designed and works best for you. °Exercise regularly as told by your health care provider. This may include: °Doing strength training two times a week, such as: °Lifting weights. °Using resistance bands. °Push-ups. °Sit-ups. °Yoga. °Doing a certain intensity of exercise for a given amount of time. Choose from these options: °A total of 150 minutes of moderate-intensity exercise every week. °A total of 75 minutes of vigorous-intensity exercise every week. °A mix of moderate-intensity and  vigorous-intensity exercise every week. °Children, pregnant women, people who have not exercised regularly, people who are overweight, and older adults may need to talk with a health care provider about what activities are safe to perform. If you have a medical condition, be sure to talk with your health care provider before you start a new exercise program. °What are some exercise ideas? °Moderate-intensity exercise ideas include: °Walking 1 mile (1.6 km) in about 15 minutes. °Biking. °Hiking. °Golfing. °Dancing. °Water aerobics. °Vigorous-intensity exercise ideas include: °Walking 4.5 miles (7.2 km) or more in about 1 hour. °Jogging or running 5 miles (8 km) in about 1 hour. °Biking 10 miles (16.1 km) or more in about 1 hour. °Lap swimming. °Roller-skating or in-line skating. °Cross-country skiing. °Vigorous competitive sports, such as football, basketball, and soccer. °Jumping rope. °Aerobic dancing. °What are some everyday activities that can help me get exercise? °Yard work, such as: °Pushing a lawn mower. °Raking and bagging leaves. °Washing your car. °Pushing a stroller. °Shoveling snow. °Gardening. °Washing windows or floors. °How can I be more active in my day-to-day activities? °Use stairs instead of an elevator. °Take a walk during your lunch break. °If you drive, park your car farther away from your work or school. °If you take public transportation, get off one stop early and walk the rest of the way. °Stand up or walk around during all of your indoor phone calls. °Get up, stretch, and walk around every 30 minutes throughout the day. °Enjoy exercise with a friend. Support to continue exercising will help you keep a regular routine of activity. °Where to find more information °You can find more information about exercising to stay healthy from: °[place].S[place] Department of Health and Human Services: www.hhs.gov °Centers for Disease Control and Prevention (  CDC): www.cdc.gov °Summary °Exercising regularly is  important. It will improve your overall fitness, flexibility, and endurance. °Regular exercise will also improve your overall health. It can help you control your weight, reduce stress, and improve your bone density. °Do not exercise so much that you hurt yourself, feel dizzy, or get very short of breath. °Before you start a new exercise program, talk with your health care provider. °This information is not intended to replace advice given to you by your health care provider. Make sure you discuss any questions you have with your health care provider. °Document Revised: 03/05/2021 Document Reviewed: 03/05/2021 °Elsevier Patient Education © 2022 [employer] ° °

## 2021-10-27 NOTE — Progress Notes (Signed)
New Patient Office Visit  Subjective:  Patient ID: Edward Mccormick, male    DOB: 06-Nov-1961  Age: 60 y.o. MRN: [id number]  CC:  Chief Complaint  Patient presents with   New Patient (Initial Visit)    HPI Edward Mccormick presents to establish care. Patient has a past medical history of hypertension, hyperlipidemia, vitamin D deficiency, hypogonadism and prediabetes.  Patient is followed by healthy weight and wellness.  Takes losartan 100 mg daily for high blood pressure and rosuvastatin 10 mg 3 times weekly for high cholesterol.  Takes an over-the-counter vitamin D3 supplement, unable to recall how many units/dose.  Patient reports blood pressure is elevated especially during dental visits.  Blood pressure at last visit with healthy weight and wellness his blood pressure was mildly elevated at 149/80.  Has not check his blood pressure consistently at home.  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  Also complains of having no energy and with busy schedule does not have a routine exercise regimen.    Past Medical History:  Diagnosis Date   Asthma    Back pain    Constipation    HTN (hypertension)    Joint pain    Seasonal allergies    Vitamin D deficiency     Past Surgical History:  Procedure Laterality Date   INGUINAL HERNIA REPAIR     PILONIDAL CYST EXCISION     WISDOM TOOTH EXTRACTION      Family History  Problem Relation Age of Onset   Hypertension Mother    Anxiety disorder Mother    Obesity Mother    Colon cancer Neg Hx    Stomach cancer Neg Hx    Esophageal cancer Neg Hx    Rectal cancer Neg Hx     Social History   Socioeconomic History   Marital status: Married    Spouse name: Gustaf Mccarter   Number of children: Not on file   Years of education: Not on file   Highest education level: Not on file  Occupational History   Occupation: Architect: [employer]  Tobacco  Use   Smoking status: Never   Smokeless tobacco: Never  Substance and Sexual Activity   Alcohol use: No   Drug use: No   Sexual activity: Yes    Partners: Female    Birth control/protection: None  Other Topics Concern   Not on file  Social History Narrative   Not on file   Social Determinants of Health   Financial Resource Strain: Not on file  Food Insecurity: Not on file  Transportation Needs: Not on file  Physical Activity: Not on file  Stress: Not on file  Social Connections: Not on file  Intimate Partner Violence: Not on file    ROS Review of Systems Review of Systems:  A fourteen system review of systems was performed and found to be positive as per HPI.  Objective:   Today's Vitals: BP 137/82   Pulse 82   Temp 98 F (36.7 C)   Ht 6\' 1"  (1.854 m)   Wt 279 lb (126.6 kg)   SpO2 100%   BMI 36.81 kg/m   Physical Exam General:  Pleasant and cooperative, in no acute distress  Neuro:  Alert and oriented,  extra-ocular muscles intact  HEENT:  Normocephalic, atraumatic, neck supple Skin:  no gross rash, warm, pink. Cardiac:  RRR, S1 S2 Respiratory:  CTA  B/Lw/o wheezing, crackles or rales Vascular:  Ext warm, no cyanosis apprec.; cap RF less 2 sec. Psych:  No HI/SI, judgement and insight good, Euthymic mood. Full Affect.  Assessment & Plan:   Problem List Items Addressed This Visit       Cardiovascular and Mediastinum   Essential hypertension     Other   Vitamin D deficiency   Other hyperlipidemia   Prediabetes   Other Visit Diagnoses     Encounter to establish care    -  Primary   Other fatigue       Class 2 severe obesity with serious comorbidity and body mass index (BMI) of 36.0 to 36.9 in adult, unspecified obesity type ([place])          Encounter to establish care: -Reviewed records and EHR.   -Followed annually by urology (Dr. Alona Bene, [facility]) -UTD on colonoscopy. -Recommend to schedule CPE.  Essential  hypertension: -BP in office today stable at 137/82 pulse 82 bpm.  Recommend to start monitoring BP/pulse at home daily or few times per week and keep a log.  If BP consistently >135/80 then recommend treatment adjustments.  Will continue current medication regimen. -Recommend to follow a low-sodium diet. -Will continue to monitor.  Other hyperlipidemia: -Last lipid panel: Total cholesterol 139, triglycerides 59, HDL 57, LDL 70 (improved from prior). -Continue rosuvastatin 10 mg. 06/23/2021-AST 27, ALT 20 -Will continue to monitor alongside [facility].  Prediabetes: -Last A1c 5.6, improved from 5.8. -Encourage to continue weight loss efforts and monitor carbohydrate and glucose intake. -Will continue to monitor.  Class II severe obesity with serious comorbidity and body mass index (BMI) of 36.0 to 36.9 in adult, unspecified obesity type: -Associated with hypertension, hyperlipidemia and prediabetes. -Followed by [facility].  Patient has lost 15 pounds since starting weight loss program. -Encourage to continue weight loss efforts with dietary and lifestyle changes.  Other fatigue: -Discussed with patient potential etiologies.  Reviewed prior lab history, no evidence of anemia, vitamin B12 or folate deficiency, or thyroid disease.  Vitamin D within normal range.  Testosterone labs from January 29, 2013 low at 314.35 (reference range 350 to 890 ng/dL).  Discussed possibly hypogonadism contributing to symptoms.  Vitamin D deficiency: -Last vitamin D48.4, stable.  Recommend to continue vitamin D3 supplement.   Outpatient Encounter Medications as of 10/27/2021  Medication Sig   losartan (COZAAR) 100 MG tablet Take 1 tablet (100 mg total) by mouth daily.   rosuvastatin (CRESTOR) 10 MG tablet Take 1 tablet (10 mg total) by mouth 3 (three) times a week.   No facility-administered encounter medications on file as of 10/27/2021.    Follow-up: Return for CPE in 4-6 weeks.   Note:  This note was prepared with  assistance of Dragon voice recognition software. Occasional wrong-word or sound-a-like substitutions may have occurred due to the inherent limitations of voice recognition software.  Lorrene Reid, PA-C

## 2021-10-27 NOTE — Therapy (Deleted)
OUTPATIENT PHYSICAL THERAPY TREATMENT NOTE   Patient Name: Edward Mccormick MRN: [id number] DOB:1961/01/11, 60 y.o., male Today's Date: 10/27/2021  PCP: Merryl Hacker, No REFERRING PROVIDER: Vanetta Mulders, MD   PT End of Session - 10/27/21 1308     Visit Number 2    Number of Visits 12    Date for PT Re-Evaluation 11/19/21    PT Start Time 1000    PT Stop Time 1042    PT Time Calculation (min) 42 min    Activity Tolerance Patient tolerated treatment well    Behavior During Therapy WFL for tasks assessed/performed             Past Medical History:  Diagnosis Date   Asthma    Back pain    Constipation    HTN (hypertension)    Joint pain    Seasonal allergies    Vitamin D deficiency    Past Surgical History:  Procedure Laterality Date   INGUINAL HERNIA REPAIR     PILONIDAL CYST EXCISION     WISDOM TOOTH EXTRACTION     Patient Active Problem List   Diagnosis Date Noted   Trapezius strain, right, initial encounter 09/30/2021   Hyperglycemia 10/21/2020   Prediabetes 07/22/2020   Other hyperlipidemia 06/30/2020   Insulin resistance 01/02/2020   Vitamin D deficiency 01/02/2020   Class 2 severe obesity with serious comorbidity and body mass index (BMI) of 39.0 to 39.9 in adult ([place]) 01/02/2020   Obesity (BMI 30.0-34.9) 03/22/2017   Pure hypercholesterolemia 03/22/2017   Essential hypertension 12/22/2016    REFERRING DIAG: G25.89 (ICD-10-CM) - Scapular dyskinesis   THERAPY DIAG:  Right shoulder pain    ONSET DATE: 6 months prior   PERTINENT HISTORY: Cervical spine pain   PRECAUTIONS: none  SUBJECTIVE: Pt reports that self TPR has been very helpful.  He feels like he has slept wrong, but the feeling is constant.    PAIN:  Are you having pain? Yes VAS scale: 5/10  Pain location: right upper trap and into the arm  Pain orientation: Right  PAIN TYPE: aching Pain description: intermittent  Aggravating factors: use of the arm; The pain can just come on a times   Relieving factors: when it is very painful he has to hold his arm overhead.     OBJECTIVE:   TODAY'S TREATMENT (10/27/21):  Therapeutic Exercise: - UBE 5' for warm-up while taking subjective - Gentle LS and UT stretching following manual therapy  Manual Therapy: - Skilled palpation and pt positioning during TDN - Cx UPA and CPA, pt in prone  - Cx PA + lateral glide of CT junction, pt in supine (minimal effect)  Trigger Point Dry Needling Treatment: Pre-treatment instruction: Patient instructed on dry needling rationale, procedures, and possible side effects including pain during treatment (achy,cramping feeling), bruising, drop of blood, lightheadedness, nausea, sweating. Patient Consent Given: Yes Education handout provided: No Muscles treated: R UT, R rhomboids, R infraspinatus  Needle size and number: .30x30x2, .30x50 x2 Electrical stimulation performed: No Parameters: N/A Treatment response/outcome: Twitch response elicited Post-treatment instructions: Patient instructed to expect possible mild to moderate muscle soreness later today and/or tomorrow. Patient instructed in methods to reduce muscle soreness and to continue prescribed HEP. If patient was dry needled over the lung field, patient was instructed on signs and symptoms of pneumothorax and, however unlikely, to see immediate medical attention should they occur. Patient was also educated on signs and symptoms of infection and to seek medical attention should they occur.  Patient verbalized understanding of these instructions and education.    HOME EXERCISE PROGRAM: Access Code: [email or web address] URL: [email or web address] Date: 10/10/2021 Prepared by: Carolyne Littles   Exercises Seated Upper Trapezius Stretch - 1 x daily - 7 x weekly - 3 reps - 20 hold Gentle Levator Scapulae Stretch - 1 x daily - 7 x weekly - 3 sets - 3 reps - 20 hold Theracane Over Shoulder - 1 x daily - 7 x weekly - 3 sets - 10 reps Shoulder  extension with resistance - Neutral - 1 x daily - 7 x weekly - 3 sets - 10 reps Scapular Retraction with Resistance - 1 x daily - 7 x weekly - 3 sets - 10 reps     ASSESSMENT:   CLINICAL IMPRESSION: Overall, [place] is progressing well with therapy.  Pt reports no increase in baseline pain following therapy.  Today we concentrated on cervical mobility  and pain reduction.  Pt responds well to TDN with significant twitch response noted in R UT and R infraspinatus.  Pt shows signs of mechanical neck pain at CT junction which had minimal change with manual therapy.  Pt will continue to benefit from skilled physical therapy to address remaining deficits and achieve listed goals.  Continue per POC.    REHAB POTENTIAL: Good   CLINICAL DECISION MAKING: Evolving/moderate complexity symptoms come and go and can become severe    EVALUATION COMPLEXITY: Moderate     GOALS: Goals reviewed with patient? Yes   SHORT TERM GOALS:   STG Name Target Date Goal status  1 Patient will demonstrate full cervical extension and right side bending without reproduction of pain  Baseline:  10/31/2021 INITIAL  2 Patient will reports a 50% reduction in pain intensity and frequency  Baseline:  10/31/2021 INITIAL  3 Patient will increase grip strength on the right by 10 lbs  Baseline: 80 lbs  10/31/2021 INITIAL   LONG TERM GOALS:    LTG Name Target Date Goal status  1 Patient will be independent with complete program to manage cervical and shoulder pain in the future.  Baseline: 11/21/2021 INITIAL  2 Patient will use shoulder for ADL's without pain  Baseline: 11/21/2021 INITIAL   PLAN: PT FREQUENCY: 2x/week   PT DURATION: 6 weeks   PLANNED INTERVENTIONS: Therapeutic exercises, Therapeutic activity, Neuro Muscular re-education, Balance training, Gait training, Patient/Family education, Joint mobilization, Dry Needling, Cryotherapy, Moist heat, Traction, Ultrasound, and Manual therapy   PLAN FOR NEXT SESSION:  patient would like to transfer to [address] because it is closer to his house; begin with TPDN of the upper trap peri-scapular area; review postural exercises. Consider seated scapular series.   Mathis Dad, PT 10/27/2021, 1:26 PM  [facility] [address] [place], [place], [address] Phone: [phone]   Fax:  [phone]

## 2021-10-27 NOTE — Therapy (Deleted)
OUTPATIENT PHYSICAL THERAPY TREATMENT NOTE   Patient Name: Edward Mccormick MRN: [id number] DOB:04/02/1961, 60 y.o., male Today's Date: 10/27/2021  PCP: Merryl Hacker, No REFERRING PROVIDER: Vanetta Mulders, MD   PT End of Session - 10/27/21 1308     Visit Number 2    Number of Visits 12    Date for PT Re-Evaluation 11/19/21    PT Start Time 1000    PT Stop Time 1042    PT Time Calculation (min) 42 min    Activity Tolerance Patient tolerated treatment well    Behavior During Therapy WFL for tasks assessed/performed             Past Medical History:  Diagnosis Date   Asthma    Back pain    Constipation    HTN (hypertension)    Joint pain    Seasonal allergies    Vitamin D deficiency    Past Surgical History:  Procedure Laterality Date   INGUINAL HERNIA REPAIR     PILONIDAL CYST EXCISION     WISDOM TOOTH EXTRACTION     Patient Active Problem List   Diagnosis Date Noted   Trapezius strain, right, initial encounter 09/30/2021   Hyperglycemia 10/21/2020   Prediabetes 07/22/2020   Other hyperlipidemia 06/30/2020   Insulin resistance 01/02/2020   Vitamin D deficiency 01/02/2020   Class 2 severe obesity with serious comorbidity and body mass index (BMI) of 39.0 to 39.9 in adult ([place]) 01/02/2020   Obesity (BMI 30.0-34.9) 03/22/2017   Pure hypercholesterolemia 03/22/2017   Essential hypertension 12/22/2016    REFERRING DIAG: G25.89 (ICD-10-CM) - Scapular dyskinesis   THERAPY DIAG:  Right shoulder pain    ONSET DATE: 6 months prior   PERTINENT HISTORY: Cervical spine pain   PRECAUTIONS: none  SUBJECTIVE: Pt reports that self TPR has been very helpful.  He feels like he has slept wrong, but the feeling is constant.    PAIN:  Are you having pain? Yes VAS scale: 5/10  Pain location: right upper trap and into the arm  Pain orientation: Right  PAIN TYPE: aching Pain description: intermittent  Aggravating factors: use of the arm; The pain can just come on a times   Relieving factors: when it is very painful he has to hold his arm overhead.     OBJECTIVE:   TODAY'S TREATMENT (10/27/21):  Therapeutic Exercise: - UBE 5' for warm-up while taking subjective - Gentle LS and UT stretching following manual therapy  Manual Therapy: - Skilled palpation and pt positioning during TDN - Cx UPA and CPA, pt in prone  - Cx PA + lateral glide of CT junction, pt in supine (minimal effect)  Trigger Point Dry Needling Treatment: Pre-treatment instruction: Patient instructed on dry needling rationale, procedures, and possible side effects including pain during treatment (achy,cramping feeling), bruising, drop of blood, lightheadedness, nausea, sweating. Patient Consent Given: Yes Education handout provided: No Muscles treated: R UT, R rhomboids, R infraspinatus  Needle size and number: .30x30x2, .30x50 x2 Electrical stimulation performed: No Parameters: N/A Treatment response/outcome: Twitch response elicited Post-treatment instructions: Patient instructed to expect possible mild to moderate muscle soreness later today and/or tomorrow. Patient instructed in methods to reduce muscle soreness and to continue prescribed HEP. If patient was dry needled over the lung field, patient was instructed on signs and symptoms of pneumothorax and, however unlikely, to see immediate medical attention should they occur. Patient was also educated on signs and symptoms of infection and to seek medical attention should they occur.  Patient verbalized understanding of these instructions and education.    HOME EXERCISE PROGRAM: Access Code: [email or web address] URL: [email or web address] Date: 10/10/2021 Prepared by: Carolyne Littles   Exercises Seated Upper Trapezius Stretch - 1 x daily - 7 x weekly - 3 reps - 20 hold Gentle Levator Scapulae Stretch - 1 x daily - 7 x weekly - 3 sets - 3 reps - 20 hold Theracane Over Shoulder - 1 x daily - 7 x weekly - 3 sets - 10 reps Shoulder  extension with resistance - Neutral - 1 x daily - 7 x weekly - 3 sets - 10 reps Scapular Retraction with Resistance - 1 x daily - 7 x weekly - 3 sets - 10 reps     ASSESSMENT:   CLINICAL IMPRESSION: Overall, [place] is progressing well with therapy.  Pt reports no increase in baseline pain following therapy.  Today we concentrated on cervical mobility  and pain reduction.  Pt responds well to TDN with significant twitch response noted in R UT and R infraspinatus.  Pt shows signs of mechanical neck pain at CT junction which had minimal change with manual therapy.  Pt will continue to benefit from skilled physical therapy to address remaining deficits and achieve listed goals.  Continue per POC.    REHAB POTENTIAL: Good   CLINICAL DECISION MAKING: Evolving/moderate complexity symptoms come and go and can become severe    EVALUATION COMPLEXITY: Moderate     GOALS: Goals reviewed with patient? Yes   SHORT TERM GOALS:   STG Name Target Date Goal status  1 Patient will demonstrate full cervical extension and right side bending without reproduction of pain  Baseline:  10/31/2021 INITIAL  2 Patient will reports a 50% reduction in pain intensity and frequency  Baseline:  10/31/2021 INITIAL  3 Patient will increase grip strength on the right by 10 lbs  Baseline: 80 lbs  10/31/2021 INITIAL   LONG TERM GOALS:    LTG Name Target Date Goal status  1 Patient will be independent with complete program to manage cervical and shoulder pain in the future.  Baseline: 11/21/2021 INITIAL  2 Patient will use shoulder for ADL's without pain  Baseline: 11/21/2021 INITIAL   PLAN: PT FREQUENCY: 2x/week   PT DURATION: 6 weeks   PLANNED INTERVENTIONS: Therapeutic exercises, Therapeutic activity, Neuro Muscular re-education, Balance training, Gait training, Patient/Family education, Joint mobilization, Dry Needling, Cryotherapy, Moist heat, Traction, Ultrasound, and Manual therapy   PLAN FOR NEXT SESSION:  patient would like to transfer to [address] because [place] is closer to his house; begin with TPDN of the upper trap peri-scapular area; review postural exercises. Consider seated scapular series.   Mathis Dad, PT 10/27/2021, 1:26 PM  [facility] [address] [place], [place], [address] Phone: [phone]   Fax:  [phone]

## 2021-10-27 NOTE — Progress Notes (Deleted)
[facility] Adult PT Treatment/Exercise:  Therapeutic Exercise: - ***  Manual Therapy: - ***  Neuromuscular re-ed: - ***  Therapeutic Activity: - ***  Self-care/Home Management: - ***

## 2021-10-27 NOTE — Therapy (Signed)
[place], [place], [address] Phone: [phone]   Fax:  [phone]  Patient Details  Name: Edward Mccormick MRN: [id number] Date of Birth: 28-Feb-1961 Referring Provider:  Vanetta Mulders, MD  Encounter Date: 10/27/2021  OUTPATIENT PHYSICAL THERAPY TREATMENT NOTE   Patient Name: Edward Mccormick MRN: [id number] DOB:09/19/1961, 60 y.o., male Today's Date: 10/27/2021  PCP: Edward Mccormick, No REFERRING PROVIDER: Vanetta Mulders, MD   PT End of Session - 10/27/21 1308     Visit Number 2    Number of Visits 12    Date for PT Re-Evaluation 11/19/21    PT Start Time 1000    PT Stop Time 1042    PT Time Calculation (min) 42 min    Activity Tolerance Patient tolerated treatment well    Behavior During Therapy [facility] for tasks assessed/performed            REFERRING DIAG: G25.89 (ICD-10-CM) - Scapular dyskinesis   THERAPY DIAG:  Right shoulder pain    ONSET DATE: 6 months prior   PERTINENT HISTORY: Cervical spine pain   PRECAUTIONS: none  SUBJECTIVE: Pt reports that self TPR has been very helpful.  He feels like he has slept wrong, but the feeling is constant.    PAIN:  Are you having pain? Yes VAS scale: 5/10  Pain location: right upper trap and into the arm  Pain orientation: Right  PAIN TYPE: aching Pain description: intermittent  Aggravating factors: use of the arm; The pain can just come on a times  Relieving factors: when it is very painful he has to hold his arm overhead.     OBJECTIVE:   TODAY'S TREATMENT (10/27/21):  Therapeutic Exercise: - UBE 5' for warm-up while taking subjective - Gentle LS and UT stretching following manual therapy  Manual Therapy: - Skilled palpation and pt positioning during TDN - Cx UPA and CPA, pt in prone  - Cx PA + lateral glide of CT junction, pt in supine (minimal effect)  Trigger Point Dry Needling Treatment: Pre-treatment instruction: Patient instructed  on dry needling rationale, procedures, and possible side effects including pain during treatment (achy,cramping feeling), bruising, drop of blood, lightheadedness, nausea, sweating. Patient Consent Given: Yes Education handout provided: No Muscles treated: R UT, R rhomboids, R infraspinatus  Needle size and number: .30x30x2, .30x50 x2 Electrical stimulation performed: No Parameters: N/A Treatment response/outcome: Twitch response elicited Post-treatment instructions: Patient instructed to expect possible mild to moderate muscle soreness later today and/or tomorrow. Patient instructed in methods to reduce muscle soreness and to continue prescribed HEP. If patient was dry needled over the lung field, patient was instructed on signs and symptoms of pneumothorax and, however unlikely, to see immediate medical attention should they occur. Patient was also educated on signs and symptoms of infection and to seek medical attention should they occur. Patient verbalized understanding of these instructions and education.    HOME EXERCISE PROGRAM: Access Code: [email or web address] URL: [email or web address] Date: 10/10/2021 Prepared by: Edward Mccormick   Exercises Seated Upper Trapezius Stretch - 1 x daily - 7 x weekly - 3 reps - 20 hold Gentle Levator Scapulae Stretch - 1 x daily - 7 x weekly - 3 sets - 3 reps - 20 hold Theracane Over Shoulder - 1 x daily - 7 x weekly - 3 sets - 10 reps Shoulder extension with resistance - Neutral - 1 x daily - 7 x weekly - 3 sets - 10 reps Scapular Retraction with Resistance - 1 x  daily - 7 x weekly - 3 sets - 10 reps     ASSESSMENT:   CLINICAL IMPRESSION: Overall, [name] is progressing well with therapy.  Pt reports no increase in baseline pain following therapy.  Today we concentrated on cervical mobility  and pain reduction.  Pt responds well to TDN with significant twitch response noted in R UT and R infraspinatus.  Pt shows signs of mechanical neck pain at CT  junction which had minimal change with manual therapy.  Pt will continue to benefit from skilled physical therapy to address remaining deficits and achieve listed goals.  Continue per POC.    REHAB POTENTIAL: Good   CLINICAL DECISION MAKING: Evolving/moderate complexity symptoms come and go and can become severe    EVALUATION COMPLEXITY: Moderate     GOALS: Goals reviewed with patient? Yes   SHORT TERM GOALS:   STG Name Target Date Goal status  1 Patient will demonstrate full cervical extension and right side bending without reproduction of pain  Baseline:  10/31/2021 INITIAL  2 Patient will reports a 50% reduction in pain intensity and frequency  Baseline:  10/31/2021 INITIAL  3 Patient will increase grip strength on the right by 10 lbs  Baseline: 80 lbs  10/31/2021 INITIAL   LONG TERM GOALS:    LTG Name Target Date Goal status  1 Patient will be independent with complete program to manage cervical and shoulder pain in the future.  Baseline: 11/21/2021 INITIAL  2 Patient will use shoulder for ADL's without pain  Baseline: 11/21/2021 INITIAL   PLAN: PT FREQUENCY: 2x/week   PT DURATION: 6 weeks   PLANNED INTERVENTIONS: Therapeutic exercises, Therapeutic activity, Neuro Muscular re-education, Balance training, Gait training, Patient/Family education, Joint mobilization, Dry Needling, Cryotherapy, Moist heat, Traction, Ultrasound, and Manual therapy   PLAN FOR NEXT SESSION: patient would like to transfer to [address] because it is closer to his house; begin with TPDN of the upper trap peri-scapular area; review postural exercises. Consider seated scapular series.  Edward Mccormick PT, DPT 10/27/21 1:52 PM   [facility] [address] [place], [place], [address] Phone: [phone]   Fax:  [phone]

## 2021-11-03 ENCOUNTER — Ambulatory Visit: Payer: BC Managed Care – PPO | Admitting: Physical Therapy

## 2021-11-03 ENCOUNTER — Encounter: Payer: Self-pay | Admitting: Physical Therapy

## 2021-11-03 ENCOUNTER — Other Ambulatory Visit: Payer: Self-pay

## 2021-11-03 DIAGNOSIS — M62838 Other muscle spasm: Secondary | ICD-10-CM

## 2021-11-03 DIAGNOSIS — G8929 Other chronic pain: Secondary | ICD-10-CM

## 2021-11-03 DIAGNOSIS — M25511 Pain in right shoulder: Secondary | ICD-10-CM | POA: Diagnosis not present

## 2021-11-03 NOTE — Therapy (Signed)
[place], [place], [address] Phone: [phone]   Fax:  [phone]  Patient Details  Name: Edward Mccormick MRN: [id number] Date of Birth: [birth year] Referring Provider:  Vanetta Mulders, MD  Encounter Date: 11/03/2021  OUTPATIENT PHYSICAL THERAPY TREATMENT NOTE   Patient Name: Edward Mccormick MRN: [id number] DOB:04-15-1961, 60 y.o., male Today's Date: 10/27/2021  PCP: Merryl Hacker, No REFERRING PROVIDER: Vanetta Mulders, MD   PT End of Session - 11/03/21 1000     Visit Number 3    Number of Visits 12    Date for PT Re-Evaluation 11/19/21    PT Start Time 1000    PT Stop Time 1043    PT Time Calculation (min) 43 min    Activity Tolerance Patient tolerated treatment well    Behavior During Therapy [facility] for tasks assessed/performed             REFERRING DIAG: G25.89 (ICD-10-CM) - Scapular dyskinesis   THERAPY DIAG:  Right shoulder pain    ONSET DATE: 6 months prior   PERTINENT HISTORY: Cervical spine pain   PRECAUTIONS: none  SUBJECTIVE: Pt feels he is improving.  He is still having pain with R rotation + ext.  PAIN:  Are you having pain? Yes VAS scale: 3/10  Pain location: right upper trap and into the arm  Pain orientation: Right  PAIN TYPE: aching Pain description: intermittent  Aggravating factors: use of the arm; The pain can just come on a times  Relieving factors: when it is very painful he has to hold his arm overhead.     OBJECTIVE:   TODAY'S TREATMENT:  Therapeutic Exercise: - UBE 5' for warm-up while taking subjective (NT) - Tx ext over wedge - 2x20x - DNF endurance training 10'' x5 - Chin tuck with R rotation - Cervical snag R rotation  Manual Therapy: - Skilled palpation and pt positioning during TDN - supine tx manip - no cavitation - Chin tuck with OP - 3x10  Trigger Point Dry Needling Treatment: Pre-treatment instruction: Patient instructed on dry needling  rationale, procedures, and possible side effects including pain during treatment (achy,cramping feeling), bruising, drop of blood, lightheadedness, nausea, sweating. Patient Consent Given: Yes Education handout provided: No Muscles treated: R UT, R infraspinatus, R Cx paraspinals @ C5-7 Electrical stimulation performed: No Parameters: N/A Treatment response/outcome: Twitch response elicited Post-treatment instructions: Patient instructed to expect possible mild to moderate muscle soreness later today and/or tomorrow. Patient instructed in methods to reduce muscle soreness and to continue prescribed HEP. If patient was dry needled over the lung field, patient was instructed on signs and symptoms of pneumothorax and, however unlikely, to see immediate medical attention should they occur. Patient was also educated on signs and symptoms of infection and to seek medical attention should they occur. Patient verbalized understanding of these instructions and education.    Access Code: [email or web address] URL: [email or web address] Date: 11/03/2021 Prepared by: Shearon Balo  Exercises Seated Upper Trapezius Stretch - 1 x daily - 7 x weekly - 3 reps - 20 hold Gentle Levator Scapulae Stretch - 1 x daily - 7 x weekly - 3 sets - 3 reps - 20 hold Theracane Over Shoulder - 1 x daily - 7 x weekly - 3 sets - 10 reps Shoulder extension with resistance - Neutral - 1 x daily - 7 x weekly - 3 sets - 10 reps Scapular Retraction with Resistance - 1 x daily - 7 x weekly - 3 sets - 10  reps Supine Deep Neck Flexor Training - Hold - 1 x daily - 7 x weekly - 1 sets - 4 reps - 15 hold Seated Assisted Cervical Rotation with Towel - 3 x daily - 7 x weekly - 2 sets - 15 reps      ASSESSMENT:   CLINICAL IMPRESSION: Overall, Edward Mccormick is progressing well with therapy.  Pt reports no increase in baseline pain following therapy.  Today we concentrated on cervical mobility , thoracic mobility, and DNF  strengthening/endurance .  Pt shows significant DNF endurance deficit which should be addressed.  Pt will continue to benefit from skilled physical therapy to address remaining deficits and achieve listed goals.  Continue per POC.    REHAB POTENTIAL: Good   CLINICAL DECISION MAKING: Evolving/moderate complexity symptoms come and go and can become severe    EVALUATION COMPLEXITY: Moderate     GOALS: Goals reviewed with patient? Yes   SHORT TERM GOALS:   STG Name Target Date Goal status  1 Patient will demonstrate full cervical extension and right side bending without reproduction of pain  Baseline:  10/31/2021 INITIAL  2 Patient will reports a 50% reduction in pain intensity and frequency  Baseline:  10/31/2021 INITIAL  3 Patient will increase grip strength on the right by 10 lbs  Baseline: 80 lbs  10/31/2021 INITIAL   LONG TERM GOALS:    LTG Name Target Date Goal status  1 Patient will be independent with complete program to manage cervical and shoulder pain in the future.  Baseline: 11/21/2021 INITIAL  2 Patient will use shoulder for ADL's without pain  Baseline: 11/21/2021 INITIAL   PLAN: PT FREQUENCY: 2x/week   PT DURATION: 6 weeks   PLANNED INTERVENTIONS: Therapeutic exercises, Therapeutic activity, Neuro Muscular re-education, Balance training, Gait training, Patient/Family education, Joint mobilization, Dry Needling, Cryotherapy, Moist heat, Traction, Ultrasound, and Manual therapy   PLAN FOR NEXT SESSION: patient would like to transfer to [address] because [place] is closer to his house; begin with TPDN of the upper trap peri-scapular area; review postural exercises. Consider seated scapular series.  Shearon Balo PT, DPT 11/03/21 10:49 AM   [facility] [address] [place], [place], [address] Phone: [phone]   Fax:  [phone]

## 2021-11-05 ENCOUNTER — Ambulatory Visit: Payer: BC Managed Care – PPO | Admitting: Physical Therapy

## 2021-11-05 ENCOUNTER — Other Ambulatory Visit: Payer: Self-pay

## 2021-11-05 ENCOUNTER — Encounter: Payer: Self-pay | Admitting: Physical Therapy

## 2021-11-05 NOTE — Therapy (Signed)
Pt arrived 15 min late (he thought his appt was at 11:00).  D/t to the late start time, we agreed to cancel this appt and resume next week.

## 2021-11-10 ENCOUNTER — Ambulatory Visit: Payer: BC Managed Care – PPO | Admitting: Physical Therapy

## 2021-11-24 ENCOUNTER — Ambulatory Visit (INDEPENDENT_AMBULATORY_CARE_PROVIDER_SITE_OTHER): Payer: BC Managed Care – PPO | Admitting: Physician Assistant

## 2021-11-24 ENCOUNTER — Other Ambulatory Visit: Payer: Self-pay

## 2021-11-24 ENCOUNTER — Encounter: Payer: Self-pay | Admitting: Physician Assistant

## 2021-11-24 VITALS — BP 118/76 | HR 70 | Temp 97.7°F | Ht 73.0 in | Wt 285.0 lb

## 2021-11-24 DIAGNOSIS — E7849 Other hyperlipidemia: Secondary | ICD-10-CM | POA: Diagnosis not present

## 2021-11-24 DIAGNOSIS — R7303 Prediabetes: Secondary | ICD-10-CM | POA: Diagnosis not present

## 2021-11-24 DIAGNOSIS — Z13228 Encounter for screening for other metabolic disorders: Secondary | ICD-10-CM

## 2021-11-24 DIAGNOSIS — I10 Essential (primary) hypertension: Secondary | ICD-10-CM | POA: Diagnosis not present

## 2021-11-24 DIAGNOSIS — Z1321 Encounter for screening for nutritional disorder: Secondary | ICD-10-CM

## 2021-11-24 DIAGNOSIS — Z0000 Encounter for general adult medical examination without abnormal findings: Secondary | ICD-10-CM

## 2021-11-24 DIAGNOSIS — Z1329 Encounter for screening for other suspected endocrine disorder: Secondary | ICD-10-CM

## 2021-11-24 DIAGNOSIS — Z130 Encounter for screening for diseases of the blood and blood-forming organs and certain disorders involving the immune mechanism: Secondary | ICD-10-CM

## 2021-11-24 NOTE — Patient Instructions (Signed)

## 2021-11-24 NOTE — Progress Notes (Addendum)
Male physical   Impression and Recommendations:    1. Healthcare maintenance   2. Essential hypertension   3. Other hyperlipidemia   4. Prediabetes   5. Screening for endocrine, nutritional, metabolic and immunity disorder      1) Anticipatory Guidance: Skin CA prevention- recommend to use sunscreen when outside along with skin surveillance; eat a balanced and modest diet; physical activity at least 25 minutes per day or minimum of 150 min/ week moderate to intense activity.  2) Immunizations / Screenings / Labs:   All immunizations are up-to-date per recommendations or will be updated today if pt allows.    - Patient understands with dental and vision screens they will schedule independently.  - Will obtain CBC, CMP, HgA1c, Lipid panel, TSH when fasting, if not already done past 12 mo/ recently.  - Patient reports has completed Shingrix series, no record of second dose in [facility].  - UTD colonoscopy, Tdap.  3) Weight:  Recommend to continue to improve diet habits to improve overall feelings of well being and objective health data. Continue weight loss efforts with diet changes. Continue with increasing physical activity level.  4) Healthcare Maintenance: -BP today controlled. Continue current medication regimen. -Recommend to stay well hydrated. -Continue to follow up with [facility]. -Follow up in 4-6 months for HTN, HLD   Orders Placed This Encounter  Procedures   CBC with Differential/Platelet    Standing Status:   Future    Number of Occurrences:   1    Standing Expiration Date:   12/25/2021   Hemoglobin A1c    Standing Status:   Future    Number of Occurrences:   1    Standing Expiration Date:   12/25/2021   Comprehensive metabolic panel    Standing Status:   Future    Number of Occurrences:   1    Standing Expiration Date:   12/25/2021    Order Specific Question:   Has the patient fasted?    Answer:   Yes   TSH    Standing Status:   Future    Number of Occurrences:    1    Standing Expiration Date:   12/25/2021   Lipid panel    Standing Status:   Future    Number of Occurrences:   1    Standing Expiration Date:   12/25/2021    Order Specific Question:   Has the patient fasted?    Answer:   Yes    No orders of the defined types were placed in this encounter.    Return for HTN, HLD in 4-6 months.     Gross side effects, risk and benefits, and alternatives of medications discussed with patient.  Patient is aware that all medications have potential side effects and we are unable to predict every side effect or drug-drug interaction that may occur.  Expresses verbal understanding and consents to current therapy plan and treatment regimen.  Please see AVS handed out to patient at the end of our visit for further patient instructions/ counseling done pertaining to today's office visit.       Subjective:        CC: CPE   HPI: Edward Mccormick is a 61 y.o. male who presents to [facility] [facility] today for a yearly health maintenance exam.     Health Maintenance Summary  - Reviewed and updated, unless pt declines services.  Last Cologuard or Colonoscopy:   04/19/2012-  repeat in 10 years Tobacco History Reviewed:   yes, never Abdominal Ultrasound:   n/a  CT scan for screening lung CA:   n/a  Alcohol / drug use:    No concerns, no use / no use Exercise Habits: started stretches and light exercises Male history: STD concerns:   none Additional penile/ urinary concerns:   none   Additional concerns beyond Health Maintenance issues:  none     Immunization History  Administered Date(s) Administered   Influenza,inj,Quad PF,6+ Mos 12/16/2016, 09/04/2018, 07/24/2019   Influenza-Unspecified 08/25/2021   Moderna SARS-COV2 Booster Vaccination 10/22/2020   Moderna Sars-Covid-2 Vaccination 12/14/2019, 01/18/2020   Pfizer Covid-19 Vaccine Bivalent Booster 8yrs & up 08/25/2021   Td 07/22/2008   Tdap 03/31/2008, 09/04/2018    Zoster Recombinat (Shingrix) 08/17/2020     Health Maintenance  Topic Date Due   HIV Screening  Never done   Zoster Vaccines- Shingrix (2 of 2) 10/12/2020   COLONOSCOPY (Pts 45-58yrs Insurance coverage will need to be confirmed)  04/19/2022   TETANUS/TDAP  09/04/2028   INFLUENZA VACCINE  Completed   COVID-19 Vaccine  Completed   Hepatitis C Screening  Completed   Pneumococcal Vaccine 8-73 Years old  Aged Out   HPV VACCINES  Aged Out       Wt Readings from Last 3 Encounters:  11/24/21 285 lb (129.3 kg)  10/27/21 279 lb (126.6 kg)  09/29/21 280 lb (127 kg)   BP Readings from Last 3 Encounters:  11/24/21 118/76  10/27/21 137/82  09/29/21 (!) 149/80   Pulse Readings from Last 3 Encounters:  11/24/21 70  10/27/21 82  09/29/21 62    Patient Active Problem List   Diagnosis Date Noted   Trapezius strain, right, initial encounter 09/30/2021   Hyperglycemia 10/21/2020   Prediabetes 07/22/2020   Other hyperlipidemia 06/30/2020   Insulin resistance 01/02/2020   Vitamin D deficiency 01/02/2020   Class 2 severe obesity with serious comorbidity and body mass index (BMI) of 39.0 to 39.9 in adult ([place]) 01/02/2020   Obesity (BMI 30.0-34.9) 03/22/2017   Pure hypercholesterolemia 03/22/2017   Essential hypertension 12/22/2016    Past Medical History:  Diagnosis Date   Asthma    Back pain    Constipation    HTN (hypertension)    Joint pain    Seasonal allergies    Vitamin D deficiency     Past Surgical History:  Procedure Laterality Date   INGUINAL HERNIA REPAIR     PILONIDAL CYST EXCISION     WISDOM TOOTH EXTRACTION      Family History  Problem Relation Age of Onset   Hypertension Mother    Anxiety disorder Mother    Obesity Mother    Colon cancer Neg Hx    Stomach cancer Neg Hx    Esophageal cancer Neg Hx    Rectal cancer Neg Hx     Social History   Substance and Sexual Activity  Drug Use No  ,  Social History   Substance and Sexual Activity   Alcohol Use No  ,  Social History   Tobacco Use  Smoking Status Never  Smokeless Tobacco Never  ,  Social History   Substance and Sexual Activity  Sexual Activity Yes   Partners: Female   Birth control/protection: None    Patient's Medications  New Prescriptions   No medications on file  Previous Medications   LOSARTAN (COZAAR) 100 MG TABLET    Take 1 tablet (100 mg total) by mouth daily.  ROSUVASTATIN (CRESTOR) 10 MG TABLET    Take 1 tablet (10 mg total) by mouth 3 (three) times a week.  Modified Medications   No medications on file  Discontinued Medications   No medications on file    Patient has no known allergies.  Review of Systems: General:   Denies fever, chills, unexplained weight loss.  Optho/Auditory:   Denies visual changes, blurred vision/LOV Respiratory:   Denies SOB, DOE more than baseline levels.   Cardiovascular:   Denies chest pain, palpitations, new onset peripheral edema  Gastrointestinal:   Denies nausea, vomiting, diarrhea.  Genitourinary: Denies dysuria, freq/ urgency, flank pain  Endocrine:     Denies hot or cold intolerance, polyuria, polydipsia. Musculoskeletal:   Denies unexplained myalgias, joint swelling, unexplained arthralgias, gait problems.  Skin:  Denies rash, suspicious lesions Neurological:     Denies dizziness, unexplained weakness, numbness  Psychiatric/Behavioral:   Denies mood changes, suicidal or homicidal ideations, hallucinations    Objective:     Blood pressure 118/76, pulse 70, temperature 97.7 F (36.5 C), height 6\' 1"  (1.854 m), weight 285 lb (129.3 kg), SpO2 98 %. Body mass index is 37.6 kg/m. General Appearance:    Alert, cooperative, no distress, appears stated age  Head:    Normocephalic, without obvious abnormality, atraumatic  Eyes:    PERRL, conjunctiva/corneas clear, EOM's intact, fundi    benign, both eyes  Ears:    Normal TM's and external ear canals, both ears  Nose:   Nares normal, septum midline,  mucosa normal, no drainage    or sinus tenderness  Throat:   Lips w/o lesion, mucosa moist, and tongue normal; teeth and gums normal  Neck:   Supple, symmetrical, trachea midline, no adenopathy;    thyroid:  normal to inspection and palpation; no cJVD  Back:     Symmetric, no curvature, ROM normal, no CVA tenderness  Lungs:     Clear to auscultation bilaterally, respirations unlabored, no Wh/ R/ R  Chest Wall:    No tenderness or gross deformity; normal excursion   Heart:    Regular rate and rhythm, S1 and S2 normal, no murmur, rub   or gallop  Abdomen:     Soft, non-tender, bowel sounds active all four quadrants, No  G/R/R, no masses, no organomegaly  Genitalia:   Deferred.  Rectal:   Deferred. Followed by Urology.  Extremities:   Extremities normal, atraumatic, no cyanosis or gross edema  Pulses:   2+ and symmetric all extremities  Skin:   Warm, dry, Skin color, texture, turgor normal, no obvious rashes or lesions  M-Sk:   Ambulates * 4 w/o difficulty, no gross deformities, tone WNL  Neurologic:   CNII-XII grossly intact Psych:  No HI/SI, judgement and insight good, Euthymic mood. Full Affect.

## 2021-11-25 LAB — LIPID PANEL
Chol/HDL Ratio: 2.4 ratio (ref 0.0–5.0)
Cholesterol, Total: 154 mg/dL (ref 100–199)
HDL: 65 mg/dL (ref >39)
LDL Chol Calc (NIH): 76 mg/dL (ref 0–99)
Triglycerides: 62 mg/dL (ref 0–149)
VLDL Cholesterol Cal: 13 mg/dL (ref 5–40)

## 2021-11-25 LAB — COMPREHENSIVE METABOLIC PANEL
ALT: 25 IU/L (ref 0–44)
AST: 23 IU/L (ref 0–40)
Albumin/Globulin Ratio: 1.6 (ref 1.2–2.2)
Albumin: 3.8 g/dL (ref 3.8–4.9)
Alkaline Phosphatase: 53 IU/L (ref 44–121)
BUN/Creatinine Ratio: 10 (ref 9–20)
BUN: 11 mg/dL (ref 6–24)
Bilirubin Total: 0.4 mg/dL (ref 0.0–1.2)
CO2: 25 mmol/L (ref 20–29)
Calcium: 8.7 mg/dL (ref 8.7–10.2)
Chloride: 105 mmol/L (ref 96–106)
Creatinine, Ser: 1.07 mg/dL (ref 0.76–1.27)
Globulin, Total: 2.4 g/dL (ref 1.5–4.5)
Glucose: 87 mg/dL (ref 70–99)
Potassium: 4.2 mmol/L (ref 3.5–5.2)
Sodium: 140 mmol/L (ref 134–144)
Total Protein: 6.2 g/dL (ref 6.0–8.5)
eGFR: 80 mL/min/{1.73_m2} (ref >59)

## 2021-11-25 LAB — CBC WITH DIFFERENTIAL/PLATELET
Basophils Absolute: 0 10*3/uL (ref 0.0–0.2)
Basos: 1 %
EOS (ABSOLUTE): 0.1 10*3/uL (ref 0.0–0.4)
Eos: 1 %
Hematocrit: 37 % — ABNORMAL LOW (ref 37.5–51.0)
Hemoglobin: 12.8 g/dL — ABNORMAL LOW (ref 13.0–17.7)
Immature Grans (Abs): 0 10*3/uL (ref 0.0–0.1)
Immature Granulocytes: 0 %
Lymphocytes Absolute: 1.6 10*3/uL (ref 0.7–3.1)
Lymphs: 29 %
MCH: 31.1 pg (ref 26.6–33.0)
MCHC: 34.6 g/dL (ref 31.5–35.7)
MCV: 90 fL (ref 79–97)
Monocytes Absolute: 0.6 10*3/uL (ref 0.1–0.9)
Monocytes: 11 %
Neutrophils Absolute: 3.1 10*3/uL (ref 1.4–7.0)
Neutrophils: 58 %
Platelets: 168 10*3/uL (ref 150–450)
RBC: 4.12 x10E6/uL — ABNORMAL LOW (ref 4.14–5.80)
RDW: 13.4 % (ref 11.6–15.4)
WBC: 5.4 10*3/uL (ref 3.4–10.8)

## 2021-11-25 LAB — HEMOGLOBIN A1C
Est. average glucose Bld gHb Est-mCnc: 120 mg/dL
Hgb A1c MFr Bld: 5.8 % — ABNORMAL HIGH (ref 4.8–5.6)

## 2021-11-25 LAB — TSH: TSH: 2.69 u[IU]/mL (ref 0.450–4.500)

## 2021-11-26 ENCOUNTER — Other Ambulatory Visit: Payer: Self-pay

## 2021-11-26 ENCOUNTER — Encounter: Payer: Self-pay | Admitting: Physical Therapy

## 2021-11-26 ENCOUNTER — Ambulatory Visit: Payer: BC Managed Care – PPO | Attending: Orthopaedic Surgery | Admitting: Physical Therapy

## 2021-11-26 DIAGNOSIS — M62838 Other muscle spasm: Secondary | ICD-10-CM | POA: Insufficient documentation

## 2021-11-26 DIAGNOSIS — G8929 Other chronic pain: Secondary | ICD-10-CM | POA: Insufficient documentation

## 2021-11-26 DIAGNOSIS — M25511 Pain in right shoulder: Secondary | ICD-10-CM | POA: Insufficient documentation

## 2021-11-26 NOTE — Therapy (Signed)
[place], [place], [address] Phone: [phone]   Fax:  [phone]  Patient Details  Name: Edward Mccormick MRN: [id number] Date of Birth: 06/28/1961 Referring Provider:  Vanetta Mulders, MD  Encounter Date: 11/26/2021  OUTPATIENT PHYSICAL THERAPY TREATMENT NOTE   Patient Name: Edward Mccormick MRN: [id number] DOB:01/01/1961, 61 y.o., male Today's Date: 10/27/2021  PCP: Merryl Hacker, No REFERRING PROVIDER: Vanetta Mulders, MD   PT End of Session - 11/26/21 1129     Visit Number 5    Number of Visits 12    Date for PT Re-Evaluation 01/07/22   extended   PT Start Time 1130    PT Stop Time 1212    PT Time Calculation (min) 42 min    Activity Tolerance Patient tolerated treatment well    Behavior During Therapy [facility] for tasks assessed/performed             REFERRING DIAG: G25.89 (ICD-10-CM) - Scapular dyskinesis   THERAPY DIAG:  Right shoulder pain    ONSET DATE: 6 months prior   PERTINENT HISTORY: Cervical spine pain   PRECAUTIONS: none  SUBJECTIVE:   Pt reports that his shoulder pain has resolved, but he still has significant pain with neck movements around CT junction  PAIN:  Are you having pain? Yes VAS scale: 8/10 - with ext and R lateral bend Pain location: right upper trap and into the arm  Pain orientation: Right  PAIN TYPE: aching Pain description: intermittent  Aggravating factors: use of the arm; The pain can just come on a times  Relieving factors: when it is very painful he has to hold his arm overhead.     OBJECTIVE:   TODAY'S TREATMENT:  Therapeutic Exercise: - UBE 5' for warm-up while taking subjective - corner pec stretch - 30'' x2 - bil prone T on table - 2x10 - updating HEP  Manual Therapy: - Skilled palpation and pt positioning during TDN  Patient Education: - HEP was updated and reissued to patient; pt educated on HEP, was provided handout, and verbally  confirmed understanding of exercises.  Trigger Point Dry Needling Treatment: Pre-treatment instruction: Patient instructed on dry needling rationale, procedures, and possible side effects including pain during treatment (achy,cramping feeling), bruising, drop of blood, lightheadedness, nausea, sweating. Patient Consent Given: Yes Education handout provided: No Muscles treated: R UT, R Cx paraspinals and multifidi @ C5-7 Electrical stimulation performed: No Parameters: N/A Treatment response/outcome: Twitch response elicited Post-treatment instructions: Patient instructed to expect possible mild to moderate muscle soreness later today and/or tomorrow. Patient instructed in methods to reduce muscle soreness and to continue prescribed HEP. If patient was dry needled over the lung field, patient was instructed on signs and symptoms of pneumothorax and, however unlikely, to see immediate medical attention should they occur. Patient was also educated on signs and symptoms of infection and to seek medical attention should they occur. Patient verbalized understanding of these instructions and education.    Access Code: [email or web address] URL: [email or web address] Date: 11/26/2021 Prepared by: Shearon Balo  Exercises Doorway Pec Stretch at 90 Degrees Abduction - 1 x daily - 7 x weekly - 3 reps - 45 hold Seated Upper Trapezius Stretch - 1 x daily - 7 x weekly - 3 reps - 20 hold Gentle Levator Scapulae Stretch - 1 x daily - 7 x weekly - 3 sets - 3 reps - 20 hold Theracane Over Shoulder - 1 x daily - 7 x weekly - 3 sets - 10  reps Prone Scapular Retraction Arms at Side - 1 x daily - 7 x weekly - 3 sets - 10 reps       ASSESSMENT:   CLINICAL IMPRESSION: Edward Mccormick has progressed well with therapy.  Improved impairments include: R shoulder pain.  Functional improvements include: ability to use R shoulder for lifting tasks with reduced pain.  Progressions needed include: continued work  on cervical pain with ext and lateral flexion.  Barriers to progress include: none.  Please see baseline and/or status section in "Goals" for specific progress on short term and long term goals established at evaluation.  I recommend continuation of PT to allow completion of remaining goals and continued functional progression.    REHAB POTENTIAL: Good   CLINICAL DECISION MAKING: Evolving/moderate complexity symptoms come and go and can become severe    EVALUATION COMPLEXITY: Moderate     GOALS: Goals reviewed with patient? Yes   SHORT TERM GOALS:   STG Name Target Date Goal status  1 Patient will demonstrate full cervical extension and right side bending without reproduction of pain  Baseline: continued reports of pain 10/31/2021 Ongoing  2 Patient will reports a 50% reduction in pain intensity and frequency  Baseline: 1/6 :elimination of shoulder pain, still having cervical pain 10/31/2021 Ongoing  3 Patient will increase grip strength on the right by 10 lbs  Baseline: 80 1/6:   10/31/2021 Deferred   LONG TERM GOALS:    LTG Name Target Date Goal status  1 Patient will be independent with complete program to manage cervical and shoulder pain in the future.  Baseline: 1/6: shoulder (yes), cervical (no) 2/17 Ongoing (1/6)  2 Patient will use shoulder for ADL's without pain  Baseline:  2/17 MET (1/6)   PLAN: PT FREQUENCY: 2x/week   PT DURATION: 6 weeks (2/17)   PLANNED INTERVENTIONS: Therapeutic exercises, Therapeutic activity, Neuro Muscular re-education, Balance training, Gait training, Patient/Family education, Joint mobilization, Dry Needling, Cryotherapy, Moist heat, Traction, Ultrasound, and Manual therapy   PLAN FOR NEXT SESSION: patient would like to transfer to [address] because [place] is closer to his house; begin with TPDN of the upper trap peri-scapular area; review postural exercises. Consider seated scapular series.  Shearon Balo PT, DPT 11/26/21 1:54  PM   [facility] [address] [place], [place], [address] Phone: [phone]   Fax:  [phone]

## 2021-12-01 ENCOUNTER — Ambulatory Visit (INDEPENDENT_AMBULATORY_CARE_PROVIDER_SITE_OTHER): Payer: BC Managed Care – PPO | Admitting: Adult Health

## 2021-12-01 ENCOUNTER — Other Ambulatory Visit: Payer: Self-pay

## 2021-12-01 ENCOUNTER — Encounter (INDEPENDENT_AMBULATORY_CARE_PROVIDER_SITE_OTHER): Payer: Self-pay | Admitting: Adult Health

## 2021-12-01 VITALS — BP 112/75 | HR 90 | Temp 98.4°F | Ht 73.0 in | Wt 273.0 lb

## 2021-12-01 DIAGNOSIS — D649 Anemia, unspecified: Secondary | ICD-10-CM | POA: Diagnosis not present

## 2021-12-01 DIAGNOSIS — Z6836 Body mass index (BMI) 36.0-36.9, adult: Secondary | ICD-10-CM

## 2021-12-01 DIAGNOSIS — I10 Essential (primary) hypertension: Secondary | ICD-10-CM

## 2021-12-01 DIAGNOSIS — E782 Mixed hyperlipidemia: Secondary | ICD-10-CM

## 2021-12-01 DIAGNOSIS — R7303 Prediabetes: Secondary | ICD-10-CM

## 2021-12-01 DIAGNOSIS — Z9189 Other specified personal risk factors, not elsewhere classified: Secondary | ICD-10-CM | POA: Diagnosis not present

## 2021-12-01 MED ORDER — ROSUVASTATIN CALCIUM 10 MG PO TABS: 10.0000 mg | ORAL_TABLET | ORAL | 0 refills | Status: DC

## 2021-12-01 NOTE — Progress Notes (Signed)
Chief Complaint:   OBESITY Edward Mccormick is here to discuss his progress with his obesity treatment plan along with follow-up of his obesity related diagnoses. Edward Mccormick is on practicing portion control and making smarter food choices, such as increasing vegetables and decreasing simple carbohydrates and states he is following his eating plan approximately 50% of the time. Edward Mccormick states he is not exercising regularly.  Today's visit was #: 21 Starting weight: 295 lbs Starting date: 11/26/2019 Today's weight: 273 lbs Today's date: 12/01/2021 Total lbs lost to date: 22 lbs Total lbs lost since last in-office visit: 7 lbs  Interim History:  On 10/27/2021, he established care with PCP/Maritza Athena Masse, at [facility] On 11/24/2020, PCP ran labs - reviewed with patient.  Subjective:   1. Hyperlipidemia, pure Discussed labs with patient today.  On 11/24/2020, CMP - LFTs WNL, lipid panel - stable. He is on Crestor 10 mg 3 times per week - denies myalgias.  2. Essential hypertension Discussed labs with patient today.  On 11/24/2020, CMP - stable.  BP/HR excellent at office visit. He is on Cozaar 100 mg daily.  Ambulatory readings:  SBP 110-120, DBP 80.  3. Prediabetes Discussed labs with patient today.  On 11/24/2020, A1c 5.8, up from 5.6 on 04/28/2021. He denies polyphagia.  On 11/24/2020, CMP with GFR >60. Discussed risks/benefits of metformin therapy.  4. Low hemoglobin Discussed labs with patient today.  On 11/24/2020, CBC - low RBC, low H/H. He denies hematuria/hematochezia.  5. At risk for diabetes mellitus Loyce is at higher than average risk for developing diabetes due to worsening prediabetes.  Assessment/Plan:   1. Hyperlipidemia, pure Refill Crestor 10 mg 3 times per week, dispense 36 tablets, 0 refills.  - Refill rosuvastatin (CRESTOR) 10 MG tablet; Take 1 tablet (10 mg total) by mouth 3 (three) times a week.  Dispense: 36 tablet; Refill: 0  2. Essential hypertension Continue  to monitor ambulatory BP.  Continue losartan 100 mg.  3. Prediabetes If weight loss stalls - consider metformin therapy. Monitor labs, decrease carbohydrates/sugar, increase protein.  4. Low hemoglobin Follow-up with PCP - iron panel, ratio count, B12/folate level. He verbalized understanding and agreement.  5. At risk for diabetes mellitus Ruben was given approximately 15 minutes of diabetes education and counseling today. We discussed intensive lifestyle modifications today with an emphasis on weight loss as well as increasing exercise and decreasing simple carbohydrates in his diet. We also reviewed medication options with an emphasis on risk versus benefit of those discussed.   Repetitive spaced learning was employed today to elicit superior memory formation and behavioral change.  6. Class 2 severe obesity with serious comorbidity and body mass index (BMI) of 36.0 to 36.9 in adult, unspecified obesity type [facility])  Jammy is currently in the action stage of change. As such, his goal is to continue with weight loss efforts. He has agreed to practicing portion control and making smarter food choices, such as increasing vegetables and decreasing simple carbohydrates.   Handout:  Metformin handout.   Exercise goals: Stretching/PT/home renovation.  Behavioral modification strategies: increasing lean protein intake, decreasing simple carbohydrates, meal planning and cooking strategies, keeping healthy foods in the home, and planning for success.  Edward Mccormick has agreed to follow-up with our clinic in 4 weeks. He was informed of the importance of frequent follow-up visits to maximize his success with intensive lifestyle modifications for his multiple health conditions.   Objective:   Blood pressure 112/75, pulse 90, temperature 98.4 F (36.9  C), height 6\' 1"  (1.854 m), weight 273 lb (123.8 kg), SpO2 97 %. Body mass index is 36.02 kg/m.  General: Cooperative, alert, well developed, in no acute  distress. HEENT: Conjunctivae and lids unremarkable. Cardiovascular: Regular rhythm.  Lungs: Normal work of breathing. Neurologic: No focal deficits.   Lab Results  Component Value Date   CREATININE 1.07 11/24/2021   BUN 11 11/24/2021   NA 140 11/24/2021   K 4.2 11/24/2021   CL 105 11/24/2021   CO2 25 11/24/2021   Lab Results  Component Value Date   ALT 25 11/24/2021   AST 23 11/24/2021   ALKPHOS 53 11/24/2021   BILITOT 0.4 11/24/2021   Lab Results  Component Value Date   HGBA1C 5.8 (H) 11/24/2021   HGBA1C 5.6 04/28/2021   HGBA1C 5.8 (H) 01/20/2021   HGBA1C 5.7 (H) 10/21/2020   HGBA1C 5.7 (H) 06/30/2020   Lab Results  Component Value Date   INSULIN 8.8 04/28/2021   INSULIN 8.7 01/20/2021   INSULIN 10.3 10/21/2020   INSULIN 11.0 06/30/2020   INSULIN 12.8 11/26/2019   Lab Results  Component Value Date   TSH 2.690 11/24/2021   Lab Results  Component Value Date   CHOL 154 11/24/2021   HDL 65 11/24/2021   LDLCALC 76 11/24/2021   LDLDIRECT 145.5 10/14/2011   TRIG 62 11/24/2021   CHOLHDL 2.4 11/24/2021   Lab Results  Component Value Date   VD25OH 48.4 04/28/2021   VD25OH 62.3 01/20/2021   VD25OH 57.8 10/21/2020   Lab Results  Component Value Date   WBC 5.4 11/24/2021   HGB 12.8 (L) 11/24/2021   HCT 37.0 (L) 11/24/2021   MCV 90 11/24/2021   PLT 168 11/24/2021   Attestation Statements:   Reviewed by clinician on day of visit: allergies, medications, problem list, medical history, surgical history, family history, social history, and previous encounter notes.  I, Water quality scientist, CMA, am acting as Location manager for Mina Marble, NP.  I have reviewed the above documentation for accuracy and completeness, and I agree with the above. -  Aviana Shevlin d. Jantzen Pilger, NP-C

## 2021-12-08 ENCOUNTER — Encounter: Payer: Self-pay | Admitting: Physical Therapy

## 2021-12-08 ENCOUNTER — Ambulatory Visit: Payer: BC Managed Care – PPO | Admitting: Physical Therapy

## 2021-12-08 ENCOUNTER — Other Ambulatory Visit: Payer: Self-pay

## 2021-12-08 DIAGNOSIS — G8929 Other chronic pain: Secondary | ICD-10-CM

## 2021-12-08 DIAGNOSIS — M62838 Other muscle spasm: Secondary | ICD-10-CM

## 2021-12-08 DIAGNOSIS — M25511 Pain in right shoulder: Secondary | ICD-10-CM | POA: Diagnosis not present

## 2021-12-08 NOTE — Therapy (Signed)
[place], [place], [address] Phone: [phone]   Fax:  [phone]  Patient Details  Name: Edward Mccormick MRN: [id number] Date of Birth: 04/13/1961 Referring Provider:  Vanetta Mulders, MD  Encounter Date: 12/08/2021  OUTPATIENT PHYSICAL THERAPY TREATMENT NOTE   Patient Name: Edward Mccormick MRN: [id number] DOB:[birth year], 61 y.o., male Today's Date: 10/27/2021  PCP: Edward Mccormick, No REFERRING PROVIDER: Vanetta Mulders, MD   PT End of Session - 12/08/21 1055     Visit Number 6    Number of Visits 12    Date for PT Re-Evaluation 01/07/22   extended   PT Start Time 3976    PT Stop Time 1131    PT Time Calculation (min) 38 min    Activity Tolerance Patient tolerated treatment well    Behavior During Therapy Edward Mccormick for tasks assessed/performed             REFERRING DIAG: G25.89 (ICD-10-CM) - Scapular dyskinesis   THERAPY DIAG:  Right shoulder pain    ONSET DATE: 6 months prior   PERTINENT HISTORY: Cervical spine pain   PRECAUTIONS: none  SUBJECTIVE:   Pt reports that he has been using advil and avoiding aggravating movements which has improved his pain.  PAIN:  Are you having pain? Yes NPRS: 3/10 - with ext and R lateral bend Pain location: right upper trap and into the arm  Pain orientation: Right  PAIN TYPE: aching Pain description: intermittent  Aggravating factors: use of the arm; The pain can just come on a times  Relieving factors: when it is very painful he has to hold his arm overhead.     OBJECTIVE:   TODAY'S TREATMENT:  Therapeutic Exercise: - UBE 5' for warm-up while taking subjective (not today) - corner pec stretch - 30'' x2 - bil prone T on table - 2x10  - W - cat camel - 10x - bird dog - 2x10 - lat pull down - 3x10 - 35# - bent row - 20# - 2x10  Manual Therapy: - Skilled palpation and pt positioning during TDN  Trigger Point Dry Needling  Treatment: Pre-treatment instruction: Patient instructed on dry needling rationale, procedures, and possible side effects including pain during treatment (achy,cramping feeling), bruising, drop of blood, lightheadedness, nausea, sweating. Patient Consent Given: Yes Education handout provided: No Muscles treated: R UT Electrical stimulation performed: No Parameters: N/A Treatment response/outcome: Twitch response elicited Post-treatment instructions: Patient instructed to expect possible mild to moderate muscle soreness later today and/or tomorrow. Patient instructed in methods to reduce muscle soreness and to continue prescribed HEP. If patient was dry needled over the lung field, patient was instructed on signs and symptoms of pneumothorax and, however unlikely, to see immediate medical attention should they occur. Patient was also educated on signs and symptoms of infection and to seek medical attention should they occur. Patient verbalized understanding of these instructions and education.  HEP:  Access Code: [email or web address] URL: [email or web address] Date: 11/26/2021 Prepared by: Edward Mccormick  Exercises Doorway Pec Stretch at 90 Degrees Abduction - 1 x daily - 7 x weekly - 3 reps - 45 hold Seated Upper Trapezius Stretch - 1 x daily - 7 x weekly - 3 reps - 20 hold Gentle Levator Scapulae Stretch - 1 x daily - 7 x weekly - 3 sets - 3 reps - 20 hold Theracane Over Shoulder - 1 x daily - 7 x weekly - 3 sets - 10 reps Prone Scapular Retraction Arms at Side -  1 x daily - 7 x weekly - 3 sets - 10 reps       ASSESSMENT:   CLINICAL IMPRESSION: Edward Mccormick is progressing well with therapy.  Pt reports no increase in baseline pain following therapy.  Today we concentrated on cervico-scapulothoracic strengthening.  Pt with reduced UT tightness following TDN.  We followed this with periscapular and cervical strengthening to good effect.  Pt will continue to benefit from skilled physical  therapy to address remaining deficits and achieve listed goals.  Continue per POC.     GOALS: Goals reviewed with patient? Yes   SHORT TERM GOALS:   STG Name Target Date Goal status  1 Patient will demonstrate full cervical extension and right side bending without reproduction of pain  Baseline: continued reports of pain 10/31/2021 Ongoing  2 Patient will reports a 50% reduction in pain intensity and frequency  Baseline: 1/6 :elimination of shoulder pain, still having cervical pain 10/31/2021 Ongoing  3 Patient will increase grip strength on the right by 10 lbs  Baseline: 80 1/6:   10/31/2021 Deferred   LONG TERM GOALS:    LTG Name Target Date Goal status  1 Patient will be independent with complete program to manage cervical and shoulder pain in the future.  Baseline: 1/6: shoulder (yes), cervical (no) 2/17 Ongoing (1/6)  2 Patient will use shoulder for ADL's without pain  Baseline:  2/17 MET (1/6)   PLAN: PT FREQUENCY: 2x/week   PT DURATION: 6 weeks (2/17)   PLANNED INTERVENTIONS: Therapeutic exercises, Therapeutic activity, Neuro Muscular re-education, Balance training, Gait training, Patient/Family education, Joint mobilization, Dry Needling, Cryotherapy, Moist heat, Traction, Ultrasound, and Manual therapy   PLAN FOR NEXT SESSION: patient would like to transfer to [address] because [place] is closer to his house; begin with TPDN of the upper trap peri-scapular area; review postural exercises. Consider seated scapular series.  Edward Mccormick PT 12/08/21 12:13 PM   [place], [place], [address] Phone: [phone]   Fax:  [phone]

## 2021-12-10 ENCOUNTER — Ambulatory Visit: Payer: BC Managed Care – PPO | Admitting: Physical Therapy

## 2021-12-14 NOTE — Therapy (Signed)
[place], [place], [address] Phone: [phone]   Fax:  [phone]  Patient Details  Name: Edward Mccormick MRN: [id number] Date of Birth: [birth year] Referring Provider:  Vanetta Mulders, MD  Encounter Date: 12/15/2021  OUTPATIENT PHYSICAL THERAPY TREATMENT NOTE   Patient Name: Edward Mccormick MRN: [id number] DOB:[birth year], 61 y.o., male Today's Date: 10/27/2021  PCP: Merryl Hacker, No REFERRING PROVIDER: Vanetta Mulders, MD   PT End of Session - 12/15/21 1037     Visit Number 7    Number of Visits 12    Date for PT Re-Evaluation 01/07/22    PT Start Time 0867    PT Stop Time 1127   4 minutes of needle insertion during TPDN   PT Time Calculation (min) 45 min    Activity Tolerance Patient tolerated treatment well    Behavior During Therapy [facility] for tasks assessed/performed              REFERRING DIAG: G25.89 (ICD-10-CM) - Scapular dyskinesis   THERAPY DIAG:  Right shoulder pain    ONSET DATE: 6 months prior   PERTINENT HISTORY: Cervical spine pain   PRECAUTIONS: none  SUBJECTIVE:   Pt reports continued soreness in his upper traps and Rt mid trap. He states he has been adherent to his HEP and that dry needling has been most helpful for his pain.  PAIN:  Are you having pain? Yes NPRS: 3/10 - with ext and R lateral bend Pain location: right upper trap and into the arm  Pain orientation: Right  PAIN TYPE: aching Pain description: intermittent  Aggravating factors: use of the arm; The pain can just come on a times  Relieving factors: when it is very painful he has to hold his arm overhead.     OBJECTIVE:   TODAY'S TREATMENT:  [place] Adult PT Treatment:                                                DATE: 12/15/2021 Therapeutic Exercise: Seated low rows with 35# cable 2x10 Seated high rows with 35# cable 2x10 Seated lat pull down with 35# cable 2x10 Seated shoulder rolls 2x10 forward and  backward Standing low trap overhead lift withtwo 3# cables at [employer] machine 2x10 Manual Therapy: Skilled palpation and pt positioning during TDN Neuromuscular re-ed: N/A Therapeutic Activity: N/A Modalities: N/A Self Care: N/A Trigger Point Dry Needling Treatment: Pre-treatment instruction: Patient instructed on dry needling rationale, procedures, and possible side effects including pain during treatment (achy,cramping feeling), bruising, drop of blood, lightheadedness, nausea, sweating. Patient Consent Given: Yes Education handout provided: No Muscles treated: BIL upper trap, Rt mid trap Electrical stimulation performed: No Parameters: N/A Treatment response/outcome: Twitch response elicited, improved tissue motility Post-treatment instructions: Patient instructed to expect possible mild to moderate muscle soreness later today and/or tomorrow. Patient instructed in methods to reduce muscle soreness and to continue prescribed HEP. If patient was dry needled over the lung field, patient was instructed on signs and symptoms of pneumothorax and, however unlikely, to see immediate medical attention should they occur. Patient was also educated on signs and symptoms of infection and to seek medical attention should they occur. Patient verbalized understanding of these instructions and education.    Treatment 12/08/2021: Therapeutic Exercise: - UBE 5' for warm-up while taking subjective (not today) - corner pec stretch -  46'' x2 - bil prone T on table - 2x10  - W - cat camel - 10x - bird dog - 2x10 - lat pull down - 3x10 - 35# - bent row - 20# - 2x10  Manual Therapy: - Skilled palpation and pt positioning during TDN  Trigger Point Dry Needling Treatment: Pre-treatment instruction: Patient instructed on dry needling rationale, procedures, and possible side effects including pain during treatment (achy,cramping feeling), bruising, drop of blood, lightheadedness, nausea,  sweating. Patient Consent Given: Yes Education handout provided: No Muscles treated: R UT Electrical stimulation performed: No Parameters: N/A Treatment response/outcome: Twitch response elicited Post-treatment instructions: Patient instructed to expect possible mild to moderate muscle soreness later today and/or tomorrow. Patient instructed in methods to reduce muscle soreness and to continue prescribed HEP. If patient was dry needled over the lung field, patient was instructed on signs and symptoms of pneumothorax and, however unlikely, to see immediate medical attention should they occur. Patient was also educated on signs and symptoms of infection and to seek medical attention should they occur. Patient verbalized understanding of these instructions and education.  HEP:  Access Code: [email or web address] URL: [email or web address] Date: 11/26/2021 Prepared by: Shearon Balo  Exercises Doorway Pec Stretch at 90 Degrees Abduction - 1 x daily - 7 x weekly - 3 reps - 45 hold Seated Upper Trapezius Stretch - 1 x daily - 7 x weekly - 3 reps - 20 hold Gentle Levator Scapulae Stretch - 1 x daily - 7 x weekly - 3 sets - 3 reps - 20 hold Theracane Over Shoulder - 1 x daily - 7 x weekly - 3 sets - 10 reps Prone Scapular Retraction Arms at Side - 1 x daily - 7 x weekly - 3 sets - 10 reps       ASSESSMENT:   CLINICAL IMPRESSION: Pt responded well to all interventions today, demonstrating good form and no increase in pain with selected exercises. Of note, he reports continued relief following TPDN today, reporting decrease in pain from 3/10 to 1/10. Additionally, twitch responses were observed and pt's muscle tissue quality was palpably improved following this intervention. He reports moderate fatigue following strengthening regimen today, but reports that he enjoyed new exercises today. He will continue to benefit from skilled PT to address his primary impairments and return to his prior  level of function with less limitation.     GOALS: Goals reviewed with patient? Yes   SHORT TERM GOALS:   STG Name Target Date Goal status  1 Patient will demonstrate full cervical extension and right side bending without reproduction of pain  Baseline: continued reports of pain 10/31/2021 Ongoing  2 Patient will reports a 50% reduction in pain intensity and frequency  Baseline: 1/6 :elimination of shoulder pain, still having cervical pain 10/31/2021 Ongoing  3 Patient will increase grip strength on the right by 10 lbs  Baseline: 80 1/6:   10/31/2021 Deferred   LONG TERM GOALS:    LTG Name Target Date Goal status  1 Patient will be independent with complete program to manage cervical and shoulder pain in the future.  Baseline: 1/6: shoulder (yes), cervical (no) 2/17 Ongoing (1/6)  2 Patient will use shoulder for ADL's without pain  Baseline:  2/17 MET (1/6)   PLAN: PT FREQUENCY: 2x/week   PT DURATION: 6 weeks (2/17)   PLANNED INTERVENTIONS: Therapeutic exercises, Therapeutic activity, Neuro Muscular re-education, Balance training, Gait training, Patient/Family education, Joint mobilization, Dry Needling, Cryotherapy, Moist heat, Traction, Ultrasound, and Manual therapy  PLAN FOR NEXT SESSION: patient would like to transfer to [address] because [place] is closer to his house; begin with TPDN of the upper trap peri-scapular area; review postural exercises. Consider seated scapular series.  Carolann Littler [facility] PT 12/15/21 11:28 AM   [facility] [address] [place], [place], [address] Phone: [phone]   Fax:  [phone]

## 2021-12-15 ENCOUNTER — Ambulatory Visit: Payer: BC Managed Care – PPO

## 2021-12-15 ENCOUNTER — Other Ambulatory Visit: Payer: Self-pay

## 2021-12-15 DIAGNOSIS — G8929 Other chronic pain: Secondary | ICD-10-CM

## 2021-12-15 DIAGNOSIS — M25511 Pain in right shoulder: Secondary | ICD-10-CM | POA: Diagnosis not present

## 2021-12-15 DIAGNOSIS — M62838 Other muscle spasm: Secondary | ICD-10-CM

## 2021-12-17 ENCOUNTER — Ambulatory Visit: Payer: BC Managed Care – PPO | Admitting: Physical Therapy

## 2021-12-17 ENCOUNTER — Encounter: Payer: Self-pay | Admitting: Physical Therapy

## 2021-12-17 ENCOUNTER — Other Ambulatory Visit: Payer: Self-pay

## 2021-12-17 DIAGNOSIS — M25511 Pain in right shoulder: Secondary | ICD-10-CM | POA: Diagnosis not present

## 2021-12-17 DIAGNOSIS — M62838 Other muscle spasm: Secondary | ICD-10-CM

## 2021-12-17 DIAGNOSIS — G8929 Other chronic pain: Secondary | ICD-10-CM

## 2021-12-17 NOTE — Therapy (Signed)
Edward Mccormick, [place], [address] Phone: [phone]   Fax:  [phone]  Patient Details  Name: Edward Mccormick MRN: [id number] Date of Birth: 1961/08/22 Referring Provider:  Vanetta Mulders, MD  Encounter Date: 12/17/2021  OUTPATIENT PHYSICAL THERAPY TREATMENT NOTE   Patient Name: Edward Mccormick MRN: [id number] DOB:Jul 21, 1961, 61 y.o., male Today's Date: 10/27/2021  PCP: Merryl Hacker, No REFERRING PROVIDER: Vanetta Mulders, MD   PT End of Session - 12/17/21 1050     Visit Number 8    Number of Visits 12    Date for PT Re-Evaluation 01/07/22    PT Start Time 1050    PT Stop Time 1130    PT Time Calculation (min) 40 min    Activity Tolerance Patient tolerated treatment well    Behavior During Therapy [facility] for tasks assessed/performed              REFERRING DIAG: G25.89 (ICD-10-CM) - Scapular dyskinesis   THERAPY DIAG:  Right shoulder pain    ONSET DATE: 6 months prior   PERTINENT HISTORY: Cervical spine pain   PRECAUTIONS: none  SUBJECTIVE:   Pt reports that he was sore after last visit.  He had a little more pain yesterday than normal.  PAIN:  Are you having pain? Yes NPRS: 3/10 - with ext and R lateral bend Pain location: right upper trap and into the arm  Pain orientation: Right  PAIN TYPE: aching Pain description: intermittent  Aggravating factors: use of the arm; The pain can just come on a times  Relieving factors: when it is very painful he has to hold his arm overhead.    OBJECTIVE:   TODAY'S TREATMENT:   Treatment 12/17/2021: Therapeutic Exercise: - UBE 5' for warm-up while taking subjective (not today) - cat camel - 10x - lat pull down - 2x10 - 45# - OH press 2x10 5# KB bottom up  Manual Therapy: - Skilled palpation and pt positioning during TDN - STM bil UT, R mid trap, R infraspinatus  Trigger Point Dry Needling Treatment: Pre-treatment instruction: Patient  instructed on dry needling rationale, procedures, and possible side effects including pain during treatment (achy,cramping feeling), bruising, drop of blood, lightheadedness, nausea, sweating. Patient Consent Given: Yes Education handout provided: No Muscles treated: R UT, L UT, R Mid trap, R infraspinatus, R sub scap Electrical stimulation performed: No Parameters: N/A Treatment response/outcome: Twitch response elicited Post-treatment instructions: Patient instructed to expect possible mild to moderate muscle soreness later today and/or tomorrow. Patient instructed in methods to reduce muscle soreness and to continue prescribed HEP. If patient was dry needled over the lung field, patient was instructed on signs and symptoms of pneumothorax and, however unlikely, to see immediate medical attention should they occur. Patient was also educated on signs and symptoms of infection and to seek medical attention should they occur. Patient verbalized understanding of these instructions and education.  HEP:  Access Code: [email or web address] URL: [email or web address] Date: 11/26/2021 Prepared by: Shearon Balo  Exercises Doorway Pec Stretch at 90 Degrees Abduction - 1 x daily - 7 x weekly - 3 reps - 45 hold Seated Upper Trapezius Stretch - 1 x daily - 7 x weekly - 3 reps - 20 hold Gentle Levator Scapulae Stretch - 1 x daily - 7 x weekly - 3 sets - 3 reps - 20 hold Theracane Over Shoulder - 1 x daily - 7 x weekly - 3 sets - 10 reps Prone Scapular Retraction Arms at Side -  1 x daily - 7 x weekly - 3 sets - 10 reps       ASSESSMENT:   CLINICAL IMPRESSION: [name] is progressing well with therapy.  Pt reports significant pain reduction following therapy (>/= 2 pts NPRS).  Today we concentrated on cervico-scapulothoracic strengthening and pain reduction.  Pt with significant pain reduction following therapy.  Pt will continue to benefit from skilled physical therapy to address remaining deficits  and achieve listed goals.  Continue per POC.     GOALS: Goals reviewed with patient? Yes   SHORT TERM GOALS:   STG Name Target Date Goal status  1 Patient will demonstrate full cervical extension and right side bending without reproduction of pain  Baseline: continued reports of pain 10/31/2021 Ongoing  2 Patient will reports a 50% reduction in pain intensity and frequency  Baseline: 1/6 :elimination of shoulder pain, still having cervical pain 10/31/2021 Ongoing  3 Patient will increase grip strength on the right by 10 lbs  Baseline: 80 1/6:   10/31/2021 Deferred   LONG TERM GOALS:    LTG Name Target Date Goal status  1 Patient will be independent with complete program to manage cervical and shoulder pain in the future.  Baseline: 1/6: shoulder (yes), cervical (no) 2/17 Ongoing (1/6)  2 Patient will use shoulder for ADL's without pain  Baseline:  2/17 MET (1/6)   PLAN: PT FREQUENCY: 2x/week   PT DURATION: 6 weeks (2/17)   PLANNED INTERVENTIONS: Therapeutic exercises, Therapeutic activity, Neuro Muscular re-education, Balance training, Gait training, Patient/Family education, Joint mobilization, Dry Needling, Cryotherapy, Moist heat, Traction, Ultrasound, and Manual therapy   PLAN FOR NEXT SESSION: patient would like to transfer to [address] because [place] is closer to his house; begin with TPDN of the upper trap peri-scapular area; review postural exercises. Consider seated scapular series.  Edward Mccormick PT 12/17/21 10:51 AM   [facility] [address] [place], [place], [address] Phone: [phone]   Fax:  [phone]

## 2021-12-22 ENCOUNTER — Ambulatory Visit: Payer: BC Managed Care – PPO | Admitting: Physical Therapy

## 2021-12-23 ENCOUNTER — Ambulatory Visit (INDEPENDENT_AMBULATORY_CARE_PROVIDER_SITE_OTHER): Payer: BC Managed Care – PPO | Admitting: Orthopaedic Surgery

## 2021-12-23 ENCOUNTER — Other Ambulatory Visit: Payer: Self-pay

## 2021-12-23 DIAGNOSIS — M7551 Bursitis of right shoulder: Secondary | ICD-10-CM | POA: Diagnosis not present

## 2021-12-23 DIAGNOSIS — M25511 Pain in right shoulder: Secondary | ICD-10-CM | POA: Diagnosis not present

## 2021-12-23 MED ORDER — LIDOCAINE HCL 1 % IJ SOLN
4.0000 mL | INTRAMUSCULAR | Status: AC | PRN
  Administered 2021-12-23: 4 mL

## 2021-12-23 MED ORDER — TRIAMCINOLONE ACETONIDE 40 MG/ML IJ SUSP
80.0000 mg | INTRAMUSCULAR | Status: AC | PRN
  Administered 2021-12-23: 80 mg via INTRA_ARTICULAR

## 2021-12-23 NOTE — Progress Notes (Signed)
Chief Complaint: right shoulder pain     History of Present Illness:   12/23/2021: Presents today for persistent right shoulder pain.  This is more so localized to the lateral aspect of the shoulder.  He has been working extensively with physical therapy which is helping his scapular pain although he has had little improvement with his lateral deltoid pain and overhead activity.  Edward Mccormick is a 61 y.o. male right-hand-dominant male with right scapular type pain has been going on for approximately 6 months.  He states that he felt like he might of slept on it wrong and since that time is of a deep pain in the posterior aspect of the shoulder.  He will occasionally endorse popping and cracking.  He has tried aspartame cream and Aleve which helps somewhat.  He denies any loss of motion with overhead activity.  He has been using the arm significantly recently as he has 3 daughters who needed help around the house.  He works as a Company secretary as well as Copy History:   None  PMH/PSH/Family History/Social History/Meds/Allergies:    Past Medical History:  Diagnosis Date   Asthma    Back pain    Constipation    HTN (hypertension)    Joint pain    Seasonal allergies    Vitamin D deficiency    Past Surgical History:  Procedure Laterality Date   INGUINAL HERNIA REPAIR     PILONIDAL CYST EXCISION     WISDOM TOOTH EXTRACTION     Social History   Socioeconomic History   Marital status: Married    Spouse name: Susumu Hackler   Number of children: Not on file   Years of education: Not on file   Highest education level: Not on file  Occupational History   Occupation: Architect: [employer]  Tobacco Use   Smoking status: Never   Smokeless tobacco: Never  Substance and Sexual Activity   Alcohol use: No   Drug use: No   Sexual activity: Yes    Partners: Female    Birth  control/protection: None  Other Topics Concern   Not on file  Social History Narrative   Not on file   Social Determinants of Health   Financial Resource Strain: Not on file  Food Insecurity: Not on file  Transportation Needs: Not on file  Physical Activity: Not on file  Stress: Not on file  Social Connections: Not on file   Family History  Problem Relation Age of Onset   Hypertension Mother    Anxiety disorder Mother    Obesity Mother    Colon cancer Neg Hx    Stomach cancer Neg Hx    Esophageal cancer Neg Hx    Rectal cancer Neg Hx    No Known Allergies Current Outpatient Medications  Medication Sig Dispense Refill   losartan (COZAAR) 100 MG tablet Take 1 tablet (100 mg total) by mouth daily. 90 tablet 0   rosuvastatin (CRESTOR) 10 MG tablet Take 1 tablet (10 mg total) by mouth 3 (three) times a week. 36 tablet 0   No current facility-administered medications for this visit.   No results found.  Review of Systems:   A ROS was performed including pertinent positives and negatives  as documented in the HPI.  Physical Exam :   Constitutional: NAD and appears stated age Neurological: Alert and oriented Psych: Appropriate affect and cooperative There were no vitals taken for this visit.   Comprehensive Musculoskeletal Exam:    Musculoskeletal Exam    Inspection Right Left  Skin No atrophy or winging No atrophy or winging  Palpation    Tenderness Lateral deltoid None  Range of Motion    Flexion (passive) 170 170  Flexion (active) 170 170  Abduction 170 170  ER at the side 70 70  Can reach behind back to T12 T12  Strength     Full Full  Special Tests    Pseudoparalytic No No  Neurologic    Fires PIN, radial, median, ulnar, musculocutaneous, axillary, suprascapular, long thoracic, and spinal accessory innervated muscles. No abnormal sensibility  Vascular/Lymphatic    Radial Pulse 2+ 2+  Cervical Exam    Patient has symmetric cervical range of motion with  negative Spurling's test.  Special Test: Positive Spurling     Imaging:   Xray (3 views right shoulder): Mild AC degeneration   I personally reviewed and interpreted the radiographs.   Assessment:   61 year old male previously being treated for his right subscapular bursitis which he improved but now with exam and pain consistent with a subacromial bursitis versus partial rotator cuff tearing.  Given the fact that he has failed physical therapy for so long I would like to place a subacromial injection.  I would also like an MRI at this time to further assess and advise him given the fact that therapy has not been effective.  Plan :    -Plan for right shoulder MRI -Right subacromial injection performed after verbal consent obtained  I believe that advance imaging in the form of an MRI is indicated for the following reasons: -Xrays images were obtained and not diagnostic -The patient has failed treatment modalities including activity restriction, Advil, topical NSAIDs, extensive physical therapy -The following worrisome symptoms are present on history and exam: Subacromial impingement with concern for rotator cuff partial return     Procedure Note  Patient: Edward Mccormick             Date of Birth: 1961/01/31           MRN: [id number]             Visit Date: 12/23/2021  Procedures: Visit Diagnoses:  1. Right shoulder pain, unspecified chronicity     Large Joint Inj: R subacromial bursa on 12/23/2021 1:26 PM Indications: pain Details: 22 G 1.5 in needle, ultrasound-guided anterior approach  Arthrogram: No  Medications: 4 mL lidocaine 1 %; 80 mg triamcinolone acetonide 40 MG/ML Outcome: tolerated well, no immediate complications Procedure, treatment alternatives, risks and benefits explained, specific risks discussed. Consent was given by the patient. Immediately prior to procedure a time out was called to verify the correct patient, procedure, equipment, support staff and  site/side marked as required. Patient was prepped and draped in the usual sterile fashion.         I personally saw and evaluated the patient, and participated in the management and treatment plan.  Vanetta Mulders, MD Attending Physician, Orthopedic Surgery  This document was dictated using Dragon voice recognition software. A reasonable attempt at proof reading has been made to minimize errors.

## 2021-12-24 ENCOUNTER — Ambulatory Visit: Payer: BC Managed Care – PPO | Admitting: Physical Therapy

## 2021-12-25 ENCOUNTER — Other Ambulatory Visit: Payer: Self-pay

## 2021-12-25 ENCOUNTER — Ambulatory Visit
Admission: RE | Admit: 2021-12-25 | Discharge: 2021-12-25 | Disposition: A | Discharge status: Home / Self Care | Payer: BC Managed Care – PPO | Source: Ambulatory Visit | Attending: Orthopaedic Surgery | Admitting: Orthopaedic Surgery

## 2021-12-25 DIAGNOSIS — M25511 Pain in right shoulder: Secondary | ICD-10-CM

## 2021-12-29 ENCOUNTER — Ambulatory Visit: Payer: BC Managed Care – PPO | Admitting: Physical Therapy

## 2021-12-29 ENCOUNTER — Other Ambulatory Visit: Payer: Self-pay

## 2021-12-29 ENCOUNTER — Encounter (INDEPENDENT_AMBULATORY_CARE_PROVIDER_SITE_OTHER): Payer: Self-pay | Admitting: Adult Health

## 2021-12-29 ENCOUNTER — Ambulatory Visit (INDEPENDENT_AMBULATORY_CARE_PROVIDER_SITE_OTHER): Payer: BC Managed Care – PPO | Admitting: Adult Health

## 2021-12-29 VITALS — BP 136/82 | HR 67 | Temp 98.3°F | Ht 73.0 in | Wt 277.0 lb

## 2021-12-29 DIAGNOSIS — E782 Mixed hyperlipidemia: Secondary | ICD-10-CM

## 2021-12-29 DIAGNOSIS — G8929 Other chronic pain: Secondary | ICD-10-CM | POA: Diagnosis not present

## 2021-12-29 DIAGNOSIS — Z6836 Body mass index (BMI) 36.0-36.9, adult: Secondary | ICD-10-CM

## 2021-12-29 DIAGNOSIS — M25511 Pain in right shoulder: Secondary | ICD-10-CM

## 2021-12-29 DIAGNOSIS — Z9189 Other specified personal risk factors, not elsewhere classified: Secondary | ICD-10-CM | POA: Diagnosis not present

## 2021-12-29 DIAGNOSIS — R7303 Prediabetes: Secondary | ICD-10-CM

## 2021-12-29 DIAGNOSIS — E668 Other obesity: Secondary | ICD-10-CM

## 2021-12-29 MED ORDER — ROSUVASTATIN CALCIUM 10 MG PO TABS: 10.0000 mg | ORAL_TABLET | ORAL | 0 refills | Status: DC

## 2021-12-29 NOTE — Progress Notes (Signed)
Chief Complaint:   OBESITY Edward Mccormick is here to discuss his progress with his obesity treatment plan along with follow-up of his obesity related diagnoses. Edward Mccormick is on practicing portion control and making smarter food choices, such as increasing vegetables and decreasing simple carbohydrates and states he is following his eating plan approximately 70% of the time. Edward Mccormick states he is walking for 45 minutes 1 time per week.  Today's visit was #: 22 Starting weight: 295 lbs Starting date: 11/26/2019 Today's weight: 277 lbs Today's date: 12/29/2021 Total lbs lost to date: 18 lbs Total lbs lost since last in-office visit: 0  Interim History:  Edward Mccormick' right shoulder pain continues to worsen. He feels that PT has only aggravated his sx's- therefore he canceled his last 3 PT appointments. 12/23/21 OV with [facility]/Dr Sammuel Hines- ordered MRI and provided steroid injection. He is R hand dominant. He is able to perform ADLs, unable to work on home repairs/renovations.  Subjective:   1. Hyperlipidemia/prediabetes He is on Crestor 10 mg 3 times per week - denies myalgias. On 11/24/2021 - CMP - LFT - WNL, lipid panel - at goal.  2. Chronic right shoulder pain 12/23/21 OV with [facility]/Dr Sammuel Hines- ordered MRI and provided steroid injection. 12/25/21 MRI Impression: IMPRESSION: 1. Tendinopathy and partial-thickness articular surface tear of the supraspinatus and infraspinatus tendons. Trace amount of fluid in the sub deltoid bursa, which may be secondary to pinhole full-thickness rotator cuff tear.   2. Tendinopathy and low-grade partial-thickness insertional tear of the subscapularis   3.  Moderate acromioclavicular osteoarthritis.   4.  Mild glenohumeral osteoarthritis.   5. No evidence of fracture or dislocation. Intraosseous cystic changes of the greater tuberosity about the insertion of the infraspinatus. Marrow signal is otherwise within normal limits.    3. At risk for activity  intolerance Edward Mccormick is at risk for exercise intolerance due to worsening right shoulder pain.  Assessment/Plan:   1. Hyperlipidemia/prediabetes Refill Crestor 10 mg 3 times per week.  - Refill rosuvastatin (CRESTOR) 10 MG tablet; Take 1 tablet (10 mg total) by mouth 3 (three) times a week.  Dispense: 36 tablet; Refill: 0  2. Chronic right shoulder pain Follow-up with Dr. Fredrich Romans- referral placed.  3. At risk for activity intolerance Edward Mccormick was given approximately 15 minutes of exercise intolerance counseling today. He is 61 y.o. male and has risk factors exercise intolerance including obesity. We discussed intensive lifestyle modifications today with an emphasis on specific weight loss instructions and strategies. Edward Mccormick will slowly increase activity as tolerated.  Repetitive spaced learning was employed today to elicit superior memory formation and behavioral change.  4. Class 2 severe obesity with serious comorbidity and body mass index (BMI) of 36.0 to 36.9 in adult, unspecified obesity type Edward Mccormick)  Edward Mccormick is currently in the action stage of change. As such, his goal is to continue with weight loss efforts. He has agreed to practicing portion control and making smarter food choices, such as increasing vegetables and decreasing simple carbohydrates.   Get in 30 grams of protein at each major meal.  Exercise goals:  Walk/PT.  Behavioral modification strategies: increasing lean protein intake, decreasing simple carbohydrates, meal planning and cooking strategies, keeping healthy foods in the home, and planning for success.  Edward Mccormick has agreed to follow-up with our clinic in 4-6 weeks. He was informed of the importance of frequent follow-up visits to maximize his success with intensive lifestyle modifications for his multiple health conditions.   Objective:   Blood pressure 136/82,  pulse 67, temperature 98.3 F (36.8 C), height 6\' 1"  (1.854 m), weight 277 lb (125.6 kg), SpO2 99  %. Body mass index is 36.55 kg/m.  General: Cooperative, alert, well developed, in no acute distress. HEENT: Conjunctivae and lids unremarkable. Cardiovascular: Regular rhythm.  Lungs: Normal work of breathing. Neurologic: No focal deficits.   Lab Results  Component Value Date   CREATININE 1.07 11/24/2021   BUN 11 11/24/2021   NA 140 11/24/2021   K 4.2 11/24/2021   CL 105 11/24/2021   CO2 25 11/24/2021   Lab Results  Component Value Date   ALT 25 11/24/2021   AST 23 11/24/2021   ALKPHOS 53 11/24/2021   BILITOT 0.4 11/24/2021   Lab Results  Component Value Date   HGBA1C 5.8 (H) 11/24/2021   HGBA1C 5.6 04/28/2021   HGBA1C 5.8 (H) 01/20/2021   HGBA1C 5.7 (H) 10/21/2020   HGBA1C 5.7 (H) 06/30/2020   Lab Results  Component Value Date   INSULIN 8.8 04/28/2021   INSULIN 8.7 01/20/2021   INSULIN 10.3 10/21/2020   INSULIN 11.0 06/30/2020   INSULIN 12.8 11/26/2019   Lab Results  Component Value Date   TSH 2.690 11/24/2021   Lab Results  Component Value Date   CHOL 154 11/24/2021   HDL 65 11/24/2021   LDLCALC 76 11/24/2021   LDLDIRECT 145.5 10/14/2011   TRIG 62 11/24/2021   CHOLHDL 2.4 11/24/2021   Lab Results  Component Value Date   VD25OH 48.4 04/28/2021   VD25OH 62.3 01/20/2021   VD25OH 57.8 10/21/2020   Lab Results  Component Value Date   WBC 5.4 11/24/2021   HGB 12.8 (L) 11/24/2021   HCT 37.0 (L) 11/24/2021   MCV 90 11/24/2021   PLT 168 11/24/2021   Attestation Statements:   Reviewed by clinician on day of visit: allergies, medications, problem list, medical history, surgical history, family history, social history, and previous encounter notes.  I, Water quality scientist, CMA, am acting as Location manager for Mina Marble, NP.  I have reviewed the above documentation for accuracy and completeness, and I agree with the above. -  Aleya Durnell d. Arpan Eskelson, NP-C

## 2022-01-05 ENCOUNTER — Other Ambulatory Visit (INDEPENDENT_AMBULATORY_CARE_PROVIDER_SITE_OTHER): Payer: Self-pay | Admitting: Adult Health

## 2022-01-05 ENCOUNTER — Telehealth (INDEPENDENT_AMBULATORY_CARE_PROVIDER_SITE_OTHER): Payer: Self-pay | Admitting: Adult Health

## 2022-01-05 DIAGNOSIS — R7303 Prediabetes: Secondary | ICD-10-CM

## 2022-01-05 NOTE — Telephone Encounter (Signed)
Pt last seen by Katy Danford, FNP.  

## 2022-01-05 NOTE — Telephone Encounter (Signed)
Patient is out of rosuvastatin (CRESTOR) 10 MG tablet. Patient needs a refill asap. Patient is completely out. Patient's pharmacy [facility] is out and medication on back order. Pt would like a call back today at [phone]. Pt wants to know if he can obtain the medication at another pharmacy.

## 2022-01-05 NOTE — Telephone Encounter (Signed)
Handled.   

## 2022-01-07 ENCOUNTER — Ambulatory Visit (HOSPITAL_BASED_OUTPATIENT_CLINIC_OR_DEPARTMENT_OTHER): Payer: BC Managed Care – PPO | Admitting: Orthopaedic Surgery

## 2022-02-02 ENCOUNTER — Ambulatory Visit (INDEPENDENT_AMBULATORY_CARE_PROVIDER_SITE_OTHER): Payer: BC Managed Care – PPO | Admitting: Adult Health

## 2022-02-02 ENCOUNTER — Encounter (INDEPENDENT_AMBULATORY_CARE_PROVIDER_SITE_OTHER): Payer: Self-pay | Admitting: Adult Health

## 2022-02-02 ENCOUNTER — Other Ambulatory Visit: Payer: Self-pay

## 2022-02-02 VITALS — BP 131/88 | HR 65 | Temp 98.4°F | Ht 73.0 in | Wt 278.0 lb

## 2022-02-02 DIAGNOSIS — E669 Obesity, unspecified: Secondary | ICD-10-CM | POA: Diagnosis not present

## 2022-02-02 DIAGNOSIS — I10 Essential (primary) hypertension: Secondary | ICD-10-CM

## 2022-02-02 DIAGNOSIS — E7849 Other hyperlipidemia: Secondary | ICD-10-CM | POA: Diagnosis not present

## 2022-02-02 DIAGNOSIS — R7303 Prediabetes: Secondary | ICD-10-CM | POA: Diagnosis not present

## 2022-02-02 DIAGNOSIS — Z9189 Other specified personal risk factors, not elsewhere classified: Secondary | ICD-10-CM

## 2022-02-02 MED ORDER — METFORMIN HCL 500 MG PO TABS: ORAL_TABLET | ORAL | 0 refills | Status: DC

## 2022-02-02 MED ORDER — ROSUVASTATIN CALCIUM 10 MG PO TABS: 10.0000 mg | ORAL_TABLET | Freq: Every day | ORAL | 0 refills | Status: DC

## 2022-02-02 NOTE — Progress Notes (Signed)
? ? ? ?Chief Complaint:  ? ?OBESITY ?Edward Mccormick is here to discuss his progress with his obesity treatment plan along with follow-up of his obesity related diagnoses. Edward Mccormick is on practicing portion control and making smarter food choices, such as increasing vegetables and decreasing simple carbohydrates and states he is following his eating plan approximately 75% of the time. Edward Mccormick states he is walking for 40 minutes 2 times per week. ? ?Today's visit was #: 23 ?Starting weight: 295 lbs ?Starting date: 11/26/2019 ?Today's weight: 278 lbs ?Today's date: 02/02/2022 ?Total lbs lost to date: 17 lbs ?Total lbs lost since last in-office visit: 0 ? ?Interim History:  ?He completed PT and was seen by new Orthopedic Specialist- impinged nerve  ?Right shoulder pain 0/10 at rest, 1/10 with movement. ? ?After considering risks/benefits he would like to proceed with Metformin therapy. ? ?Subjective:  ? ?1. Other hyperlipidemia ?Achieved goal total/LDL levels with Crestor 10 mg 3x/week. ?ON 11/24/2021, CMP - LFTs WNL. ?He denies myalgias. ? ?2. Prediabetes ?After considering risks/benefits he would like to proceed with Metformin therapy. ?On 11/24/2021, CMP - GFR 80, A1c 5.8 ? ?3. Essential hypertension ?DBP slightly above goal today and at several OVs upon Epic review. ?On losartan 100 mg daily. ?No acute cardiac symptoms. ? ?4. At risk for diarrhea ?Edward Mccormick is at higher risk of diarrhea due to starting metformin for prediabetes.  ? ?Assessment/Plan:  ? ?1. Other hyperlipidemia ?Refill and increase Crestor to 10 mg daily, from 1 tab 3 x week. ? ?- Increase rosuvastatin (CRESTOR) 10 MG tablet; Take 1 tablet (10 mg total) by mouth daily.  Dispense: 30 tablet; Refill: 0 ? ?2. Prediabetes ?Start metformin 1/2 tablet at lunch for 1 week, then increase to 1 full tablet at lunch and hold at this dose. ? ?- Start metFORMIN (GLUCOPHAGE) 500 MG tablet; 1/2 tab with lunch for one week, then increase to 1 full tab at lunch- hold at this dose  Dispense:  30 tablet; Refill: 0 ? ?3. Essential hypertension ?Check BP at home - send numbers via MyChart. ?If still above goal, consider adding HCTZ 12.5 mg to losartan= Hyzaar. ? ?4. At risk for diarrhea ?Edward Mccormick was given approximately 15 minutes of diarrhea prevention counseling today. He is 61 y.o. male and has risk factors for diarrhea including medications and changes in diet. We discussed intensive lifestyle modifications today with an emphasis on specific weight loss instructions including dietary strategies.  ? ?Repetitive spaced learning was employed today to elicit superior memory formation and behavioral change.  ? ?5. Obesity, current BMI 36.7 ? ?Edward Mccormick is currently in the action stage of change. As such, his goal is to continue with weight loss efforts. He has agreed to practicing portion control and making smarter food choices, such as increasing vegetables and decreasing simple carbohydrates.  ? ?Exercise goals:  As is. ? ?Behavioral modification strategies: increasing lean protein intake, decreasing simple carbohydrates, meal planning and cooking strategies, keeping healthy foods in the home, and planning for success. ? ?Edward Mccormick has agreed to follow-up with our clinic in 4-5 weeks. He was informed of the importance of frequent follow-up visits to maximize his success with intensive lifestyle modifications for his multiple health conditions.  ? ?Objective:  ? ?Blood pressure 131/88, pulse 65, temperature 98.4 ?F (36.9 ?C), height '6\' 1"'$  (1.854 m), weight 278 lb (126.1 kg), SpO2 99 %. ?Body mass index is 36.68 kg/m?. ? ?General: Cooperative, alert, well developed, in no acute distress. ?HEENT: Conjunctivae and lids unremarkable. ?Cardiovascular: Regular rhythm.  ?  Lungs: Normal work of breathing. ?Neurologic: No focal deficits.  ? ?Lab Results  ?Component Value Date  ? CREATININE 1.07 11/24/2021  ? BUN 11 11/24/2021  ? NA 140 11/24/2021  ? K 4.2 11/24/2021  ? CL 105 11/24/2021  ? CO2 25 11/24/2021  ? ?Lab Results   ?Component Value Date  ? ALT 25 11/24/2021  ? AST 23 11/24/2021  ? ALKPHOS 53 11/24/2021  ? BILITOT 0.4 11/24/2021  ? ?Lab Results  ?Component Value Date  ? HGBA1C 5.8 (H) 11/24/2021  ? HGBA1C 5.6 04/28/2021  ? HGBA1C 5.8 (H) 01/20/2021  ? HGBA1C 5.7 (H) 10/21/2020  ? HGBA1C 5.7 (H) 06/30/2020  ? ?Lab Results  ?Component Value Date  ? INSULIN 8.8 04/28/2021  ? INSULIN 8.7 01/20/2021  ? INSULIN 10.3 10/21/2020  ? INSULIN 11.0 06/30/2020  ? INSULIN 12.8 11/26/2019  ? ?Lab Results  ?Component Value Date  ? TSH 2.690 11/24/2021  ? ?Lab Results  ?Component Value Date  ? CHOL 154 11/24/2021  ? HDL 65 11/24/2021  ? [place] 76 11/24/2021  ? LDLDIRECT 145.5 10/14/2011  ? TRIG 62 11/24/2021  ? CHOLHDL 2.4 11/24/2021  ? ?Lab Results  ?Component Value Date  ? VD25OH 48.4 04/28/2021  ? VD25OH 62.3 01/20/2021  ? VD25OH 57.8 10/21/2020  ? ?Lab Results  ?Component Value Date  ? WBC 5.4 11/24/2021  ? HGB 12.8 (L) 11/24/2021  ? HCT 37.0 (L) 11/24/2021  ? MCV 90 11/24/2021  ? PLT 168 11/24/2021  ? ?Attestation Statements:  ? ?Reviewed by clinician on day of visit: allergies, medications, problem list, medical history, surgical history, family history, social history, and previous encounter notes. ? ?I, Water quality scientist, CMA, am acting as Location manager for Mina Marble, NP. ? ?I have reviewed the above documentation for accuracy and completeness, and I agree with the above. -  Edward Mccormick d. Edward Brosious, NP-C ?

## 2022-02-17 ENCOUNTER — Other Ambulatory Visit (INDEPENDENT_AMBULATORY_CARE_PROVIDER_SITE_OTHER): Payer: Self-pay | Admitting: Adult Health

## 2022-02-17 ENCOUNTER — Encounter (INDEPENDENT_AMBULATORY_CARE_PROVIDER_SITE_OTHER): Payer: Self-pay | Admitting: Adult Health

## 2022-02-17 MED ORDER — HYDROCHLOROTHIAZIDE 12.5 MG PO TABS: 12.5000 mg | ORAL_TABLET | Freq: Every day | ORAL | 0 refills | Status: DC

## 2022-02-17 NOTE — Telephone Encounter (Signed)
Please see message and advise.  Thank you. ° °

## 2022-02-24 ENCOUNTER — Other Ambulatory Visit (INDEPENDENT_AMBULATORY_CARE_PROVIDER_SITE_OTHER): Payer: Self-pay | Admitting: Adult Health

## 2022-02-24 ENCOUNTER — Encounter (INDEPENDENT_AMBULATORY_CARE_PROVIDER_SITE_OTHER): Payer: Self-pay | Admitting: Adult Health

## 2022-02-24 ENCOUNTER — Encounter: Payer: Self-pay | Admitting: Physician Assistant

## 2022-02-24 DIAGNOSIS — R7303 Prediabetes: Secondary | ICD-10-CM

## 2022-02-28 ENCOUNTER — Other Ambulatory Visit: Payer: Self-pay

## 2022-02-28 DIAGNOSIS — Z1211 Encounter for screening for malignant neoplasm of colon: Secondary | ICD-10-CM

## 2022-02-28 NOTE — Telephone Encounter (Signed)
Please see message and advise.  Thank you. ° °

## 2022-03-04 ENCOUNTER — Encounter (INDEPENDENT_AMBULATORY_CARE_PROVIDER_SITE_OTHER): Payer: Self-pay | Admitting: Adult Health

## 2022-03-15 ENCOUNTER — Encounter: Payer: Self-pay | Admitting: Internal Medicine

## 2022-03-16 ENCOUNTER — Ambulatory Visit (INDEPENDENT_AMBULATORY_CARE_PROVIDER_SITE_OTHER): Payer: BC Managed Care – PPO | Admitting: Adult Health

## 2022-03-16 ENCOUNTER — Encounter (INDEPENDENT_AMBULATORY_CARE_PROVIDER_SITE_OTHER): Payer: Self-pay | Admitting: Adult Health

## 2022-03-16 VITALS — BP 104/73 | HR 59 | Temp 98.4°F | Ht 73.0 in | Wt 273.0 lb

## 2022-03-16 DIAGNOSIS — E7849 Other hyperlipidemia: Secondary | ICD-10-CM

## 2022-03-16 DIAGNOSIS — E669 Obesity, unspecified: Secondary | ICD-10-CM

## 2022-03-16 DIAGNOSIS — R7303 Prediabetes: Secondary | ICD-10-CM

## 2022-03-16 DIAGNOSIS — Z6836 Body mass index (BMI) 36.0-36.9, adult: Secondary | ICD-10-CM

## 2022-03-16 DIAGNOSIS — I10 Essential (primary) hypertension: Secondary | ICD-10-CM

## 2022-03-16 DIAGNOSIS — Z9189 Other specified personal risk factors, not elsewhere classified: Secondary | ICD-10-CM

## 2022-03-16 DIAGNOSIS — E559 Vitamin D deficiency, unspecified: Secondary | ICD-10-CM | POA: Diagnosis not present

## 2022-03-16 MED ORDER — SAXENDA 18 MG/3ML ~~LOC~~ SOPN
0.6000 mg | PEN_INJECTOR | Freq: Every day | SUBCUTANEOUS | 0 refills | Status: DC
Start: 2022-03-16 — End: 2022-04-13

## 2022-03-16 MED ORDER — LOSARTAN POTASSIUM 50 MG PO TABS: 50.0000 mg | ORAL_TABLET | Freq: Every day | ORAL | 0 refills | Status: DC

## 2022-03-17 ENCOUNTER — Encounter (INDEPENDENT_AMBULATORY_CARE_PROVIDER_SITE_OTHER): Payer: Self-pay

## 2022-03-17 ENCOUNTER — Telehealth (INDEPENDENT_AMBULATORY_CARE_PROVIDER_SITE_OTHER): Payer: Self-pay | Admitting: Adult Health

## 2022-03-17 LAB — COMPREHENSIVE METABOLIC PANEL
ALT: 20 IU/L (ref 0–44)
AST: 22 IU/L (ref 0–40)
Albumin/Globulin Ratio: 1.6 (ref 1.2–2.2)
Albumin: 4.1 g/dL (ref 3.8–4.9)
Alkaline Phosphatase: 47 IU/L (ref 44–121)
BUN/Creatinine Ratio: 11 (ref 9–20)
BUN: 13 mg/dL (ref 6–24)
Bilirubin Total: 0.4 mg/dL (ref 0.0–1.2)
CO2: 26 mmol/L (ref 20–29)
Calcium: 9.5 mg/dL (ref 8.7–10.2)
Chloride: 103 mmol/L (ref 96–106)
Creatinine, Ser: 1.14 mg/dL (ref 0.76–1.27)
Globulin, Total: 2.6 g/dL (ref 1.5–4.5)
Glucose: 92 mg/dL (ref 70–99)
Potassium: 4.6 mmol/L (ref 3.5–5.2)
Sodium: 140 mmol/L (ref 134–144)
Total Protein: 6.7 g/dL (ref 6.0–8.5)
eGFR: 74 mL/min/{1.73_m2} (ref >59)

## 2022-03-17 LAB — LIPID PANEL
Chol/HDL Ratio: 2.6 ratio (ref 0.0–5.0)
Cholesterol, Total: 172 mg/dL (ref 100–199)
HDL: 67 mg/dL (ref >39)
LDL Chol Calc (NIH): 93 mg/dL (ref 0–99)
Triglycerides: 59 mg/dL (ref 0–149)
VLDL Cholesterol Cal: 12 mg/dL (ref 5–40)

## 2022-03-17 LAB — INSULIN, RANDOM: INSULIN: 15.7 u[IU]/mL (ref 2.6–24.9)

## 2022-03-17 LAB — HEMOGLOBIN A1C
Est. average glucose Bld gHb Est-mCnc: 123 mg/dL
Hgb A1c MFr Bld: 5.9 % — ABNORMAL HIGH (ref 4.8–5.6)

## 2022-03-17 LAB — VITAMIN B12: Vitamin B-12: 667 pg/mL (ref 232–1245)

## 2022-03-17 LAB — VITAMIN D 25 HYDROXY (VIT D DEFICIENCY, FRACTURES): Vit D, 25-Hydroxy: 55.9 ng/mL (ref 30.0–100.0)

## 2022-03-17 NOTE — Telephone Encounter (Signed)
Prior authorization approved for Saxenda. Effective: 03/16/2022 - 07/16/2022. Patient sent approval message via mychart.  ?

## 2022-03-21 ENCOUNTER — Encounter (INDEPENDENT_AMBULATORY_CARE_PROVIDER_SITE_OTHER): Payer: Self-pay | Admitting: Adult Health

## 2022-03-21 ENCOUNTER — Other Ambulatory Visit (INDEPENDENT_AMBULATORY_CARE_PROVIDER_SITE_OTHER): Payer: Self-pay | Admitting: Adult Health

## 2022-03-21 MED ORDER — HYDROCHLOROTHIAZIDE 12.5 MG PO TABS: 12.5000 mg | ORAL_TABLET | Freq: Every day | ORAL | 0 refills | Status: DC

## 2022-03-21 NOTE — Telephone Encounter (Signed)
LAST APPOINTMENT DATE: 03/16/22 ?NEXT APPOINTMENT DATE: 04/13/22 ? ? ?[facility] [place] [facility] [place] [address]) [place] [address] ?Phone: [phone] Fax: [phone] ? ?[id number] [place] [address] ?[id number]?Phone: [phone]: [phone]? ?Patient is requesting a refill of the following medications: ?No prescriptions requested or ordered in this encounter ? ? ?Date last filled: 02/17/22 ?Previously prescribed by KValetta Fuller? ?Lab Results ?     Component                Value               Date                 ?     HGBA1C                   5.9 (H)             03/16/2022           ?     HGBA1C                   5.8 (H)             11/24/2021           ?     HGBA1C                   5.6                 04/28/2021           ?Lab Results ?     Component                Value               Date                 ?     [place]                 93                  03/16/2022           ?     CREATININE               1.14                03/16/2022           ?Lab Results ?     Component                Value               Date                 ?     VD25OH                   55.9                03/16/2022           ?     VD25OH                   48.4                04/28/2021           ?  VD25OH                   62.3                01/20/2021           ? ?BP Readings from Last 3 Encounters: ?03/16/22 : 104/73 ?02/02/22 : 131/88 ?12/29/21 : 136/82 ?

## 2022-03-26 ENCOUNTER — Other Ambulatory Visit (INDEPENDENT_AMBULATORY_CARE_PROVIDER_SITE_OTHER): Payer: Self-pay | Admitting: Adult Health

## 2022-03-26 DIAGNOSIS — E7849 Other hyperlipidemia: Secondary | ICD-10-CM

## 2022-03-29 NOTE — Progress Notes (Addendum)
Chief Complaint:   OBESITY Edward Mccormick is here to discuss his progress with his obesity treatment plan along with follow-up of his obesity related diagnoses. Edward Mccormick is on PC/[place], Healthy choices and states he is following his eating plan approximately 80% of the time. [name] states he is not exercising.  Today's visit was #: 24 Starting weight: 295 Starting date: 11/26/2019 Today's weight: 273 Today's date: 03/16/2022 Total lbs lost to date: 22 Total lbs lost since last in-office visit: 5  Interim History:  Edward Mccormick has recently been experiencing increased fatigue.  He denies chest pain or dyspnea.  BO soft at OV today-104/73, HR 59   Subjective:   1. Essential hypertension Ambulatory reading ranging systolic BP 82-423 diastolic BP 53-61W. He is on HCTZ 12.'5mg'$  QD, Losartan '100mg'$  QD He denies tobacco/vape use.  2. Prediabetes He has been off Metformin 500 mg daily for 4-5 days.   He denies an GI upset., however he feels that Metformin was ineffective to reduce cravings. He denies family history of MTC or personal history of pancreatitis.    3. Other hyperlipidemia He is on Crestor 10 mg and denies any myalgia.    4. Vitamin D deficiency Elijan is not on any Vitamin D supplement.    5. At risk for constipation Bartlett is at increased risk for constipation due to inadequate water intake, changes in diet, and/or use of medications such as GLP1 agonists. [name] denies hard, infrequent stools currently.    Assessment/Plan:   1. Essential hypertension [name] agrees to a decrease in Losartan to 50 mg daily, he will remain on HCTZ 12.5 mg daily.   Will have him check blood pressure and heart rate at home and bring his readings in on next visit.   - losartan (COZAAR) 50 MG tablet; Take 1 tablet (50 mg total) by mouth daily.  Dispense: 30 tablet; Refill: 0 - Comprehensive metabolic panel  2. Prediabetes Edward Mccormick will remain off of Metformin and start Saxenda.  We will obtain labs. -  Hemoglobin A1c - Insulin, random - Vitamin B12  3. Other hyperlipidemia We will obtain labs.  - Lipid panel  4. Vitamin D deficiency Will obtain labs.  - VITAMIN D 25 Hydroxy (Vit-D Deficiency, Fractures)  5. At risk for constipation Start Saxenda for obesity  6. Obesity, current BMI 36.1 Edward Mccormick agrees to start Eion [place] is currently in the action stage of change. As such, his goal is to continue with weight loss efforts. He has agreed to continue PC/[place] meal plan.   Remain off Metformin  Start Saxenda 0.'6mg'$  once week Disp 60m RF 0  Exercise goals: As is  Behavioral modification strategies: increasing lean protein intake, decreasing simple carbohydrates, meal planning and cooking strategies, keeping healthy foods in the home, and planning for success.  JEissahas agreed to follow-up with our clinic in 3 -4 weeks. He was informed of the importance of frequent follow-up visits to maximize his success with intensive lifestyle modifications for his multiple health conditions.   JGranitewas informed we would discuss his lab results at his next visit unless there is a critical issue that needs to be addressed sooner. JLegendagreed to keep his next visit at the agreed upon time to discuss these results.  Objective:   Blood pressure 104/73, pulse (!) 59, temperature 98.4 F (36.9 C), height '6\' 1"'$  (1.854 m), weight 273 lb (123.8 kg), SpO2 96 %. Body mass index is 36.02 kg/m.  General: Cooperative, alert, well developed, in no  acute distress. HEENT: Conjunctivae and lids unremarkable. Cardiovascular: Regular rhythm.  Lungs: Normal work of breathing. Neurologic: No focal deficits.   Lab Results  Component Value Date   CREATININE 1.14 03/16/2022   BUN 13 03/16/2022   NA 140 03/16/2022   K 4.6 03/16/2022   CL 103 03/16/2022   CO2 26 03/16/2022   Lab Results  Component Value Date   ALT 20 03/16/2022   AST 22 03/16/2022   ALKPHOS 47 03/16/2022   BILITOT 0.4 03/16/2022    Lab Results  Component Value Date   HGBA1C 5.9 (H) 03/16/2022   HGBA1C 5.8 (H) 11/24/2021   HGBA1C 5.6 04/28/2021   HGBA1C 5.8 (H) 01/20/2021   HGBA1C 5.7 (H) 10/21/2020   Lab Results  Component Value Date   INSULIN 15.7 03/16/2022   INSULIN 8.8 04/28/2021   INSULIN 8.7 01/20/2021   INSULIN 10.3 10/21/2020   INSULIN 11.0 06/30/2020   Lab Results  Component Value Date   TSH 2.690 11/24/2021   Lab Results  Component Value Date   CHOL 172 03/16/2022   HDL 67 03/16/2022   LDLCALC 93 03/16/2022   LDLDIRECT 145.5 10/14/2011   TRIG 59 03/16/2022   CHOLHDL 2.6 03/16/2022   Lab Results  Component Value Date   VD25OH 55.9 03/16/2022   VD25OH 48.4 04/28/2021   VD25OH 62.3 01/20/2021   Lab Results  Component Value Date   WBC 5.4 11/24/2021   HGB 12.8 (L) 11/24/2021   HCT 37.0 (L) 11/24/2021   MCV 90 11/24/2021   PLT 168 11/24/2021   No results found for: IRON, TIBC, FERRITIN   Attestation Statements:   Reviewed by clinician on day of visit: allergies, medications, problem list, medical history, surgical history, family history, social history, and previous encounter notes.  I, Althea Charon, am acting as Location manager for Mina Marble, NP.  I have reviewed the above documentation for accuracy and completeness, and I agree with the above. -  Shaquon Gropp d. Eren Puebla, NP-C

## 2022-04-06 ENCOUNTER — Ambulatory Visit (AMBULATORY_SURGERY_CENTER): Payer: BC Managed Care – PPO | Admitting: *Deleted

## 2022-04-06 VITALS — Ht 73.0 in | Wt 273.0 lb

## 2022-04-06 DIAGNOSIS — Z1211 Encounter for screening for malignant neoplasm of colon: Secondary | ICD-10-CM

## 2022-04-06 MED ORDER — NA SULFATE-K SULFATE-MG SULF 17.5-3.13-1.6 GM/177ML PO SOLN: 1.0000 | ORAL | 0 refills | Status: DC

## 2022-04-06 NOTE — Progress Notes (Signed)
Patient's pre-visit was done today over the phone with the patient. Name,DOB and address verified. Patient denies any allergies to Eggs and Soy. Patient denies any problems with anesthesia/sedation. Patient is not taking any diet pills or blood thinners. No home Oxygen. Insurance confirmed with patient. ? ?Prep instructions sent to pt's MyChart-pt is aware. Patient understands to call us back with any questions or concerns. Patient is aware of our care-partner policy. Pt denies constipation at this time. Suprep used due to h/o constipation. Pt will use [facility] for suprep rx. ? ?EMMI education assigned to the patient for the procedure, sent to [place].  ? ?The patient is COVID-19 vaccinated.   ?

## 2022-04-07 ENCOUNTER — Other Ambulatory Visit (INDEPENDENT_AMBULATORY_CARE_PROVIDER_SITE_OTHER): Payer: Self-pay | Admitting: Adult Health

## 2022-04-07 DIAGNOSIS — R7303 Prediabetes: Secondary | ICD-10-CM

## 2022-04-12 ENCOUNTER — Other Ambulatory Visit (INDEPENDENT_AMBULATORY_CARE_PROVIDER_SITE_OTHER): Payer: Self-pay | Admitting: Adult Health

## 2022-04-13 ENCOUNTER — Encounter (INDEPENDENT_AMBULATORY_CARE_PROVIDER_SITE_OTHER): Payer: Self-pay | Admitting: Adult Health

## 2022-04-13 ENCOUNTER — Ambulatory Visit (INDEPENDENT_AMBULATORY_CARE_PROVIDER_SITE_OTHER): Payer: BC Managed Care – PPO | Admitting: Adult Health

## 2022-04-13 VITALS — BP 107/75 | HR 79 | Temp 97.9°F | Ht 73.0 in | Wt 273.0 lb

## 2022-04-13 DIAGNOSIS — Z6836 Body mass index (BMI) 36.0-36.9, adult: Secondary | ICD-10-CM

## 2022-04-13 DIAGNOSIS — R7303 Prediabetes: Secondary | ICD-10-CM

## 2022-04-13 DIAGNOSIS — E669 Obesity, unspecified: Secondary | ICD-10-CM

## 2022-04-13 DIAGNOSIS — E559 Vitamin D deficiency, unspecified: Secondary | ICD-10-CM | POA: Diagnosis not present

## 2022-04-13 DIAGNOSIS — E7849 Other hyperlipidemia: Secondary | ICD-10-CM | POA: Diagnosis not present

## 2022-04-13 DIAGNOSIS — I10 Essential (primary) hypertension: Secondary | ICD-10-CM | POA: Diagnosis not present

## 2022-04-13 DIAGNOSIS — Z9189 Other specified personal risk factors, not elsewhere classified: Secondary | ICD-10-CM

## 2022-04-13 MED ORDER — SAXENDA 18 MG/3ML ~~LOC~~ SOPN: 1.2000 mg | PEN_INJECTOR | Freq: Every day | SUBCUTANEOUS | 0 refills | Status: DC

## 2022-04-13 MED ORDER — HYDROCHLOROTHIAZIDE 12.5 MG PO TABS: ORAL_TABLET | ORAL | 0 refills | Status: DC

## 2022-04-13 MED ORDER — LOSARTAN POTASSIUM 25 MG PO TABS: 25.0000 mg | ORAL_TABLET | Freq: Every day | ORAL | 0 refills | Status: DC

## 2022-04-15 ENCOUNTER — Encounter: Payer: Self-pay | Admitting: Internal Medicine

## 2022-04-15 DIAGNOSIS — Z9189 Other specified personal risk factors, not elsewhere classified: Secondary | ICD-10-CM | POA: Insufficient documentation

## 2022-04-15 NOTE — Progress Notes (Unsigned)
Chief Complaint:   OBESITY Edward Mccormick is here to discuss his progress with his obesity treatment plan along with follow-up of his obesity related diagnoses. Edward Mccormick is on practicing portion control and making smarter food choices, such as increasing vegetables and decreasing simple carbohydrates and states he is following his eating plan approximately 90% of the time. Edward Mccormick states he is doing cardio and yard work 30 minutes 2 times per week.  Today's visit was #: 25 Starting weight: 295 lbs Starting date: 11/26/2019 Today's weight: 273 lbs Today's date: 04/13/2022 Total lbs lost to date: 22 lbs Total lbs lost since last in-office visit: 0  Interim History: 03/16/2022:  Started on Saxenda 0.6 mg, titrated up to 1.2 mg daily. ***  Subjective:   1. Essential hypertension Discussed labs with patient today 03/16/2022:  losartan decreased from 100 mg to 50 mg continued on HCTZ 12.5 mg. ARB reduction due to blood pressure trending low. Home readings:  systolic blood pressure 937, diastolic blood pressure 70, so he decreased losartan 50 mg to 25 mg daily after decrease in systolic blood pressure 902 diastolic blood pressure 70 with increased fatigue.  Then he held losartan 25 mg and HCTZ 12.5 mg. Systolic blood pressure 409, diastolic blood pressure 82.  2. Prediabetes Discussed labs with patient today. 03/16/22:  metformin replaced with saxenda 0.6 mg-taken for two weeks, increase 1.2 mg ***  3. Vitamin D deficiency Discussed labs with patient today. 03/16/22:  Vitamin d level 55.9, using OTC vitamin d3 2,000 IU daily.  4. Other hyperlipidemia Discussed labs with patient today. He is on crestor 10 mg 3 times a week. CMP/Lipid panel:  ***  5. At risk for complication associated with hypotension The patient is at a higher than average risk of hypotension due to weight loss and steady weight loss, and 2-antihypertensives.    Assessment/Plan:   1. Essential hypertension Refill  losartan 25 mg daily dispense 30 tabs no refills.  Refill HCTZ 12.5 mg as needed for lower extremity edema.  Dispense 30, no refill.  See below.  - hydrochlorothiazide (HYDRODIURIL) 12.5 MG tablet; Take one tablet as needed for lower extremity edema.  Dispense: 30 tablet; Refill: 0 - losartan (COZAAR) 25 MG tablet; Take 1 tablet (25 mg total) by mouth daily.  Dispense: 30 tablet; Refill: 0  2. Prediabetes Continue on Saxenda 1.2 mg daily.  3. Vitamin D deficiency Continue OTC vitamin d supplement.  4. Other hyperlipidemia Continue Crestor 10 mg 3 times a week.   5. At risk for complication associated with hypotension Edward Mccormick was given approximately 15 minutes of education and counseling today to help avoid hypotension. We discussed risks of hypotension with weight loss and signs of hypotension such as feeling lightheaded or unsteady.  Repetitive spaced learning was employed today to elicit superior memory formation and behavioral change.   6. Obesity, current BMI 36.1 Refill Saxenda 1.2 mg daily dispense 6 mL, no refills.  See below.  - Liraglutide -Weight Management (SAXENDA) 18 MG/3ML SOPN; Inject 1.2 mg into the skin daily.  Dispense: 6 mL; Refill: 0  Edward Mccormick is currently in the action stage of change. As such, his goal is to continue with weight loss efforts. He has agreed to practicing portion control and making smarter food choices, such as increasing vegetables and decreasing simple carbohydrates.   Exercise goals:  As is.  Behavioral modification strategies: increasing lean protein intake, decreasing simple carbohydrates, meal planning and cooking strategies, keeping healthy foods in the home, and planning  for success.  Edward Mccormick has agreed to follow-up with our clinic in 4 weeks. He was informed of the importance of frequent follow-up visits to maximize his success with intensive lifestyle modifications for his multiple health conditions.    Objective:   Blood pressure 107/75,  pulse 79, temperature 97.9 F (36.6 C), height '6\' 1"'$  (1.854 m), weight 273 lb (123.8 kg), SpO2 100 %. Body mass index is 36.02 kg/m.  General: Cooperative, alert, well developed, in no acute distress. HEENT: Conjunctivae and lids unremarkable. Cardiovascular: Regular rhythm.  Lungs: Normal work of breathing. Neurologic: No focal deficits.   Lab Results  Component Value Date   CREATININE 1.14 03/16/2022   BUN 13 03/16/2022   NA 140 03/16/2022   K 4.6 03/16/2022   CL 103 03/16/2022   CO2 26 03/16/2022   Lab Results  Component Value Date   ALT 20 03/16/2022   AST 22 03/16/2022   ALKPHOS 47 03/16/2022   BILITOT 0.4 03/16/2022   Lab Results  Component Value Date   HGBA1C 5.9 (H) 03/16/2022   HGBA1C 5.8 (H) 11/24/2021   HGBA1C 5.6 04/28/2021   HGBA1C 5.8 (H) 01/20/2021   HGBA1C 5.7 (H) 10/21/2020   Lab Results  Component Value Date   INSULIN 15.7 03/16/2022   INSULIN 8.8 04/28/2021   INSULIN 8.7 01/20/2021   INSULIN 10.3 10/21/2020   INSULIN 11.0 06/30/2020   Lab Results  Component Value Date   TSH 2.690 11/24/2021   Lab Results  Component Value Date   CHOL 172 03/16/2022   HDL 67 03/16/2022   LDLCALC 93 03/16/2022   LDLDIRECT 145.5 10/14/2011   TRIG 59 03/16/2022   CHOLHDL 2.6 03/16/2022   Lab Results  Component Value Date   VD25OH 55.9 03/16/2022   VD25OH 48.4 04/28/2021   VD25OH 62.3 01/20/2021   Lab Results  Component Value Date   WBC 5.4 11/24/2021   HGB 12.8 (L) 11/24/2021   HCT 37.0 (L) 11/24/2021   MCV 90 11/24/2021   PLT 168 11/24/2021   No results found for: IRON, TIBC, FERRITIN  Attestation Statements:   Reviewed by clinician on day of visit: allergies, medications, problem list, medical history, surgical history, family history, social history, and previous encounter notes.  I, Davy Pique, am acting as Location manager for Mina Marble, NP.  I have reviewed the above documentation for accuracy and completeness, and I agree with the  above. -  ***

## 2022-04-20 ENCOUNTER — Encounter: Payer: Self-pay | Admitting: Internal Medicine

## 2022-04-20 ENCOUNTER — Ambulatory Visit (AMBULATORY_SURGERY_CENTER): Payer: BC Managed Care – PPO | Admitting: Internal Medicine

## 2022-04-20 VITALS — BP 108/77 | HR 66 | Temp 96.8°F | Resp 21 | Ht 73.0 in | Wt 273.0 lb

## 2022-04-20 DIAGNOSIS — D125 Benign neoplasm of sigmoid colon: Secondary | ICD-10-CM

## 2022-04-20 DIAGNOSIS — D124 Benign neoplasm of descending colon: Secondary | ICD-10-CM | POA: Diagnosis not present

## 2022-04-20 DIAGNOSIS — Z1211 Encounter for screening for malignant neoplasm of colon: Secondary | ICD-10-CM

## 2022-04-20 MED ORDER — SODIUM CHLORIDE 0.9 % IV SOLN: 500.0000 mL | Freq: Once | INTRAVENOUS | Status: DC

## 2022-04-20 NOTE — Progress Notes (Signed)
To pacu, VSS. Report to [email or web address] 

## 2022-04-20 NOTE — Patient Instructions (Addendum)
I found and removed 2 tiny polyps.  You also have a condition called diverticulosis - common and not usually a problem. Please read the handout provided.  I will let you know pathology results and when to have another routine colonoscopy by mail and/or My Chart.  I appreciate the opportunity to care for you. Gatha Mayer, MD, [facility]   Thank you for letting us take care of your healthcare needs today. Please see handouts given to you on Polyps and Diverticulosis.  YOU HAD AN ENDOSCOPIC PROCEDURE TODAY AT [facility]:   Refer to the procedure report that was given to you for any specific questions about what was found during the examination.  If the procedure report does not answer your questions, please call your gastroenterologist to clarify.  If you requested that your care partner not be given the details of your procedure findings, then the procedure report has been included in a sealed envelope for you to review at your convenience later.  YOU SHOULD EXPECT: Some feelings of bloating in the abdomen. Passage of more gas than usual.  Walking can help get rid of the air that was put into your GI tract during the procedure and reduce the bloating. If you had a lower endoscopy (such as a colonoscopy or flexible sigmoidoscopy) you may notice spotting of blood in your stool or on the toilet paper. If you underwent a bowel prep for your procedure, you may not have a normal bowel movement for a few days.  Please Note:  You might notice some irritation and congestion in your nose or some drainage.  This is from the oxygen used during your procedure.  There is no need for concern and it should clear up in a day or so.  SYMPTOMS TO REPORT IMMEDIATELY:  Following lower endoscopy (colonoscopy or flexible sigmoidoscopy):  Excessive amounts of blood in the stool  Significant tenderness or worsening of abdominal pains  Swelling of the abdomen that is new, acute  Fever of 100F or  higher   For urgent or emergent issues, a gastroenterologist can be reached at any hour by calling [phone]. Do not use MyChart messaging for urgent concerns.    DIET:  We do recommend a small meal at first, but then you may proceed to your regular diet.  Drink plenty of fluids but you should avoid alcoholic beverages for 24 hours.  ACTIVITY:  You should plan to take it easy for the rest of today and you should NOT DRIVE or use heavy machinery until tomorrow (because of the sedation medicines used during the test).    FOLLOW UP: Our staff will call the number listed on your records 48-72 hours following your procedure to check on you and address any questions or concerns that you may have regarding the information given to you following your procedure. If we do not reach you, we will leave a message.  We will attempt to reach you two times.  During this call, we will ask if you have developed any symptoms of COVID 19. If you develop any symptoms (ie: fever, flu-like symptoms, shortness of breath, cough etc.) before then, please call [phone].  If you test positive for Covid 19 in the 2 weeks post procedure, please call and report this information to [place].    If any biopsies were taken you will be contacted by phone or by letter within the next 1-3 weeks.  Please call us at [phone] if you have not  heard about the biopsies in 3 weeks.    SIGNATURES/CONFIDENTIALITY: You and/or your care partner have signed paperwork which will be entered into your electronic medical record.  These signatures attest to the fact that that the information above on your After Visit Summary has been reviewed and is understood.  Full responsibility of the confidentiality of this discharge information lies with you and/or your care-partner.

## 2022-04-20 NOTE — Progress Notes (Signed)
[facility] History and Physical   Primary Care Physician:  Lorrene Reid, PA-C   Reason for Procedure:   CRCA screening  Plan:    colonoscopy     HPI: Edward Mccormick is a 61 y.o. male here for CRCA screening   Past Medical History:  Diagnosis Date   Asthma    Back pain    Constipation    HTN (hypertension)    Joint pain    Seasonal allergies    Vitamin D deficiency     Past Surgical History:  Procedure Laterality Date   COLONOSCOPY  04/19/2012   Dr.Rakesh Dutko   INGUINAL HERNIA REPAIR     PILONIDAL CYST EXCISION     WISDOM TOOTH EXTRACTION      Prior to Admission medications   Medication Sig Start Date End Date Taking? Authorizing Provider  hydrochlorothiazide (HYDRODIURIL) 12.5 MG tablet Take one tablet as needed for lower extremity edema. 04/13/22  Yes Danford, Berna Spare, NP  Liraglutide -Weight Management (SAXENDA) 18 MG/3ML SOPN Inject 1.2 mg into the skin daily. 04/13/22  Yes Danford, Valetta Fuller D, NP  losartan (COZAAR) 25 MG tablet Take 1 tablet (25 mg total) by mouth daily. 04/13/22  Yes Danford, Valetta Fuller D, NP  rosuvastatin (CRESTOR) 10 MG tablet Take 1 tablet (10 mg total) by mouth daily. 02/02/22 03/21/24 Yes Danford, Berna Spare, NP  VITAMIN D PO Take by mouth.   Yes [provider]    Current Outpatient Medications  Medication Sig Dispense Refill   hydrochlorothiazide (HYDRODIURIL) 12.5 MG tablet Take one tablet as needed for lower extremity edema. 30 tablet 0   Liraglutide -Weight Management (SAXENDA) 18 MG/3ML SOPN Inject 1.2 mg into the skin daily. 6 mL 0   losartan (COZAAR) 25 MG tablet Take 1 tablet (25 mg total) by mouth daily. 30 tablet 0   rosuvastatin (CRESTOR) 10 MG tablet Take 1 tablet (10 mg total) by mouth daily. 30 tablet 0   VITAMIN D PO Take by mouth.     Current Facility-Administered Medications  Medication Dose Route Frequency Provider Last Rate Last Admin   0.9 %  sodium chloride infusion  500 mL Intravenous Once Gatha Mayer, MD         Allergies as of 04/20/2022   (No Known Allergies)    Family History  Problem Relation Age of Onset   Hypertension Mother    Anxiety disorder Mother    Obesity Mother    Prostate cancer Maternal Uncle    Colon cancer Maternal Uncle    Stomach cancer Neg Hx    Esophageal cancer Neg Hx    Rectal cancer Neg Hx     Social History   Socioeconomic History   Marital status: Married    Spouse name: Dexter Signor   Number of children: Not on file   Years of education: Not on file   Highest education level: Not on file  Occupational History   Occupation: Architect: [employer]  Tobacco Use   Smoking status: Never   Smokeless tobacco: Never  Vaping Use   Vaping Use: Never used  Substance and Sexual Activity   Alcohol use: No   Drug use: No   Sexual activity: Yes    Partners: Female    Birth control/protection: None  Other Topics Concern   Not on file  Social History Narrative   Not on file   Social Determinants of Health   Financial Resource Strain: Not on file  Food Insecurity: Not on file  Transportation Needs: Not on file  Physical Activity: Not on file  Stress: Not on file  Social Connections: Not on file  Intimate Partner Violence: Not on file    Review of Systems:  All other review of systems negative except as mentioned in the HPI.  Physical Exam: Vital signs BP 97/76   Pulse 74   Temp (!) 96.8 F (36 C) (Temporal)   Ht '6\' 1"'$  (1.854 m)   Wt 273 lb (123.8 kg)   SpO2 100%   BMI 36.02 kg/m   General:   Alert,  Well-developed, well-nourished, pleasant and cooperative in NAD Lungs:  Clear throughout to auscultation.   Heart:  Regular rate and rhythm; no murmurs, clicks, rubs,  or gallops. Abdomen:  Soft, nontender and nondistended. Normal bowel sounds.   Neuro/Psych:  Alert and cooperative. Normal mood and affect. A and O x 3   '@Raelea Gosse'$  Simonne Maffucci, MD, [facility] [phone]  (pager) 04/20/2022 9:54 AM@

## 2022-04-20 NOTE — Op Note (Signed)
[facility] Patient Name: Edward Mccormick Procedure Date: 04/20/2022 10:00 AM MRN: [id number] Endoscopist: Gatha Mayer , MD Age: 61 Referring MD:  Date of Birth: 11-18-1961 Gender: Male Account #: [id number] Procedure:                Colonoscopy Indications:              Screening for colorectal malignant neoplasm Medicines:                Monitored Anesthesia Care Procedure:                Pre-Anesthesia Assessment:                           - Prior to the procedure, a History and Physical                            was performed, and patient medications and                            allergies were reviewed. The patient's tolerance of                            previous anesthesia was also reviewed. The risks                            and benefits of the procedure and the sedation                            options and risks were discussed with the patient.                            All questions were answered, and informed consent                            was obtained. Prior Anticoagulants: The patient has                            taken no previous anticoagulant or antiplatelet                            agents. ASA Grade Assessment: II - A patient with                            mild systemic disease. After reviewing the risks                            and benefits, the patient was deemed in                            satisfactory condition to undergo the procedure.                           After obtaining informed consent, the colonoscope  was passed under direct vision. Throughout the                            procedure, the patient's blood pressure, pulse, and                            oxygen saturations were monitored continuously. The                            Colonoscope was introduced through the anus and                            advanced to the the cecum, identified by                            appendiceal orifice and  ileocecal valve. The                            colonoscopy was performed without difficulty. The                            patient tolerated the procedure well. The quality                            of the bowel preparation was excellent. The                            ileocecal valve, appendiceal orifice, and rectum                            were photographed. The bowel preparation used was                            SUPREP via split dose instruction. Scope In: 10:03:21 AM Scope Out: 54:98:26 AM Scope Withdrawal Time: 0 hours 10 minutes 59 seconds  Total Procedure Duration: 0 hours 12 minutes 57 seconds  Findings:                 The perianal and digital rectal examinations were                            normal. Pertinent negatives include normal prostate                            (size, shape, and consistency).                           Two sessile polyps were found in the sigmoid colon                            and descending colon. The polyps were diminutive in                            size. These polyps were removed with a cold snare.  Resection and retrieval were complete. Verification                            of patient identification for the specimen was                            done. Estimated blood loss was minimal.                           Multiple diverticula were found in the sigmoid                            colon.                           The exam was otherwise without abnormality on                            direct and retroflexion views. Complications:            No immediate complications. Estimated Blood Loss:     Estimated blood loss was minimal. Impression:               - Two diminutive polyps in the sigmoid colon and in                            the descending colon, removed with a cold snare.                            Resected and retrieved.                           - Diverticulosis in the sigmoid colon.                            - The examination was otherwise normal on direct                            and retroflexion views. Recommendation:           - Patient has a contact number available for                            emergencies. The signs and symptoms of potential                            delayed complications were discussed with the                            patient. Return to normal activities tomorrow.                            Written discharge instructions were provided to the                            patient.                           -  Resume previous diet.                           - Continue present medications.                           - Repeat colonoscopy is recommended. The                            colonoscopy date will be determined after pathology                            results from today's exam become available for                            review. Gatha Mayer, MD 04/20/2022 10:24:59 AM This report has been signed electronically.

## 2022-04-20 NOTE — Progress Notes (Signed)
Called to room to assist during endoscopic procedure.  Patient ID and intended procedure confirmed with present staff. Received instructions for my participation in the procedure from the performing physician.  

## 2022-04-20 NOTE — Progress Notes (Signed)
Pt's states no medical or surgical changes since previsit or office visit. 

## 2022-04-21 ENCOUNTER — Telehealth: Payer: Self-pay | Admitting: *Deleted

## 2022-04-21 ENCOUNTER — Telehealth: Payer: Self-pay

## 2022-04-21 NOTE — Telephone Encounter (Signed)
Left message on follow up call. 

## 2022-04-21 NOTE — Telephone Encounter (Signed)
Left message on f/u call 

## 2022-04-26 ENCOUNTER — Encounter: Payer: Self-pay | Admitting: Internal Medicine

## 2022-04-26 DIAGNOSIS — Z860101 Personal history of adenomatous and serrated colon polyps: Secondary | ICD-10-CM | POA: Insufficient documentation

## 2022-04-26 DIAGNOSIS — Z86010 Personal history of colonic polyps: Secondary | ICD-10-CM

## 2022-04-26 HISTORY — DX: Personal history of adenomatous and serrated colon polyps: Z86.0101

## 2022-04-26 HISTORY — DX: Personal history of colonic polyps: Z86.010

## 2022-05-04 ENCOUNTER — Ambulatory Visit (INDEPENDENT_AMBULATORY_CARE_PROVIDER_SITE_OTHER): Payer: BC Managed Care – PPO | Admitting: Adult Health

## 2022-05-04 ENCOUNTER — Encounter (INDEPENDENT_AMBULATORY_CARE_PROVIDER_SITE_OTHER): Payer: Self-pay | Admitting: Adult Health

## 2022-05-04 VITALS — BP 107/74 | HR 67 | Temp 97.9°F | Ht 73.0 in | Wt 271.0 lb

## 2022-05-04 DIAGNOSIS — Z9189 Other specified personal risk factors, not elsewhere classified: Secondary | ICD-10-CM

## 2022-05-04 DIAGNOSIS — Z7985 Long-term (current) use of injectable non-insulin antidiabetic drugs: Secondary | ICD-10-CM

## 2022-05-04 DIAGNOSIS — Z6836 Body mass index (BMI) 36.0-36.9, adult: Secondary | ICD-10-CM | POA: Diagnosis not present

## 2022-05-04 DIAGNOSIS — E669 Obesity, unspecified: Secondary | ICD-10-CM

## 2022-05-04 DIAGNOSIS — I10 Essential (primary) hypertension: Secondary | ICD-10-CM

## 2022-05-04 DIAGNOSIS — E7849 Other hyperlipidemia: Secondary | ICD-10-CM

## 2022-05-04 MED ORDER — ROSUVASTATIN CALCIUM 10 MG PO TABS: 10.0000 mg | ORAL_TABLET | Freq: Every day | ORAL | 0 refills | Status: DC

## 2022-05-04 MED ORDER — SAXENDA 18 MG/3ML ~~LOC~~ SOPN: 1.8000 mg | PEN_INJECTOR | Freq: Every day | SUBCUTANEOUS | 0 refills | Status: DC

## 2022-05-04 NOTE — Progress Notes (Signed)
Chief Complaint:   OBESITY Edward Mccormick is here to discuss his progress with his obesity treatment plan along with follow-up of his obesity related diagnoses. Edward Mccormick is on practicing portion control and making smarter food choices, such as increasing vegetables and decreasing simple carbohydrates and states he is following his eating plan approximately 85% of the time. Edward Mccormick states he is walking for 15 minutes 2 times per week.  Today's visit was #: 61 Starting weight: 295 lbs Starting date: 11/26/2019 Today's weight: 271 lbs Today's date: 05/04/2022 Total lbs lost to date: 24 Total lbs lost since last in-office visit: 2  Interim History:  On 04/20/2022 -colonoscopy completed. It was recommend repeat study 10 years.   He is on Saxenda 1.2 mg daily, been on this strength for 4 weeks. He denies medication SE with GLP-1 therapy.  Subjective:   1. Benign essential hypertension Muath reports complete resolution of hypotension.   He is currently on hydrochlorothiazide 12.5 mg and losartan 50 mg.   His home blood pressure readings systolic 827'M, diastolic blood pressure 78'M.  2. Other hyperlipidemia Edward Mccormick is on Crestor 10 mg 3 times per week.  He denies myalgias.   He is taking Crestor Monday, Wednesday, Friday.  3. At risk for nausea Edward Mccormick is at risk for nausea due to increased Saxenda dose for obesity.  Assessment/Plan:   1. Benign essential hypertension Edward Mccormick will remain on hydrochlorothiazide 12.5 mg daily and losartan 50 mg daily.   At next office visit-either he will remain on losartan 50 mg or reduce to 25 mg- depending on home BP readings.  2. Other hyperlipidemia We will refill Crestor 10 mg for 90 days, with no refills.  - rosuvastatin (CRESTOR) 10 MG tablet; Take 1 tablet (10 mg total) by mouth daily.  Dispense: 90 tablet; Refill: 0  3. At risk for nausea Edward Mccormick was given approximately 15 minutes of nausea prevention counseling today. Edward Mccormick is at risk for nausea  due to his new or current medication. He was encouraged to titrate his medication slowly, make sure to stay hydrated, eat smaller portions throughout the day, and avoid high fat meals.   4. Obesity, current BMI 36.1 Edward Mccormick is currently in the action stage of change. As such, his goal is to continue with weight loss efforts. He has agreed to practicing portion control and making smarter food choices, such as increasing vegetables and decreasing simple carbohydrates.   We will recheck fasting labs in 6 weeks.   We discussed various medication options to help Edward Mccormick with his weight loss efforts and we both agreed to increase Saxenda  to 1.8 mg daily, and we will refill for 1 month. She is to hold at this dose.  - Liraglutide -Weight Management (SAXENDA) 18 MG/3ML SOPN; Inject 1.8 mg into the skin daily.  Dispense: 9 mL; Refill: 0  Exercise goals: As is.   Behavioral modification strategies: increasing lean protein intake, decreasing simple carbohydrates, meal planning and cooking strategies, keeping healthy foods in the home, and planning for success.  Edward Mccormick has agreed to follow-up with our clinic in 3 to 4 weeks. He was informed of the importance of frequent follow-up visits to maximize his success with intensive lifestyle modifications for his multiple health conditions.   Objective:   Blood pressure 107/74, pulse 67, temperature 97.9 F (36.6 C), height '6\' 1"'$  (1.854 m), weight 271 lb (122.9 kg), SpO2 99 %. Body mass index is 35.75 kg/m.  General: Cooperative, alert, well developed, in no  acute distress. HEENT: Conjunctivae and lids unremarkable. Cardiovascular: Regular rhythm.  Lungs: Normal work of breathing. Neurologic: No focal deficits.   Lab Results  Component Value Date   CREATININE 1.14 03/16/2022   BUN 13 03/16/2022   NA 140 03/16/2022   K 4.6 03/16/2022   CL 103 03/16/2022   CO2 26 03/16/2022   Lab Results  Component Value Date   ALT 20 03/16/2022   AST 22 03/16/2022    ALKPHOS 47 03/16/2022   BILITOT 0.4 03/16/2022   Lab Results  Component Value Date   HGBA1C 5.9 (H) 03/16/2022   HGBA1C 5.8 (H) 11/24/2021   HGBA1C 5.6 04/28/2021   HGBA1C 5.8 (H) 01/20/2021   HGBA1C 5.7 (H) 10/21/2020   Lab Results  Component Value Date   INSULIN 15.7 03/16/2022   INSULIN 8.8 04/28/2021   INSULIN 8.7 01/20/2021   INSULIN 10.3 10/21/2020   INSULIN 11.0 06/30/2020   Lab Results  Component Value Date   TSH 2.690 11/24/2021   Lab Results  Component Value Date   CHOL 172 03/16/2022   HDL 67 03/16/2022   LDLCALC 93 03/16/2022   LDLDIRECT 145.5 10/14/2011   TRIG 59 03/16/2022   CHOLHDL 2.6 03/16/2022   Lab Results  Component Value Date   VD25OH 55.9 03/16/2022   VD25OH 48.4 04/28/2021   VD25OH 62.3 01/20/2021   Lab Results  Component Value Date   WBC 5.4 11/24/2021   HGB 12.8 (L) 11/24/2021   HCT 37.0 (L) 11/24/2021   MCV 90 11/24/2021   PLT 168 11/24/2021   No results found for: "IRON", "TIBC", "FERRITIN"  Attestation Statements:   Reviewed by clinician on day of visit: allergies, medications, problem list, medical history, surgical history, family history, social history, and previous encounter notes.   Wilhemena Durie, am acting as transcriptionist for Mina Marble, NP.  I have reviewed the above documentation for accuracy and completeness, and I agree with the above. -  Tony Granquist d. Lakena Sparlin, NP-C

## 2022-05-08 ENCOUNTER — Other Ambulatory Visit (INDEPENDENT_AMBULATORY_CARE_PROVIDER_SITE_OTHER): Payer: Self-pay | Admitting: Adult Health

## 2022-05-08 DIAGNOSIS — I10 Essential (primary) hypertension: Secondary | ICD-10-CM

## 2022-05-24 ENCOUNTER — Other Ambulatory Visit (INDEPENDENT_AMBULATORY_CARE_PROVIDER_SITE_OTHER): Payer: Self-pay | Admitting: Adult Health

## 2022-05-24 DIAGNOSIS — I10 Essential (primary) hypertension: Secondary | ICD-10-CM

## 2022-05-26 NOTE — Patient Instructions (Signed)
Hypertension, Adult High blood pressure (hypertension) is when the force of blood pumping through the arteries is too strong. The arteries are the blood vessels that carry blood from the heart throughout the body. Hypertension forces the heart to work harder to pump blood and may cause arteries to become narrow or stiff. Untreated or uncontrolled hypertension can lead to a heart attack, heart failure, a stroke, kidney disease, and other problems. A blood pressure reading consists of a higher number over a lower number. Ideally, your blood pressure should be below 120/80. The first ("top") number is called the systolic pressure. It is a measure of the pressure in your arteries as your heart beats. The second ("bottom") number is called the diastolic pressure. It is a measure of the pressure in your arteries as the heart relaxes. What are the causes? The exact cause of this condition is not known. There are some conditions that result in high blood pressure. What increases the risk? Certain factors may make you more likely to develop high blood pressure. Some of these risk factors are under your control, including: Smoking. Not getting enough exercise or physical activity. Being overweight. Having too much fat, sugar, calories, or salt (sodium) in your diet. Drinking too much alcohol. Other risk factors include: Having a personal history of heart disease, diabetes, high cholesterol, or kidney disease. Stress. Having a family history of high blood pressure and high cholesterol. Having obstructive sleep apnea. Age. The risk increases with age. What are the signs or symptoms? High blood pressure may not cause symptoms. Very high blood pressure (hypertensive crisis) may cause: Headache. Fast or irregular heartbeats (palpitations). Shortness of breath. Nosebleed. Nausea and vomiting. Vision changes. Severe chest pain, dizziness, and seizures. How is this diagnosed? This condition is diagnosed by  measuring your blood pressure while you are seated, with your arm resting on a flat surface, your legs uncrossed, and your feet flat on the floor. The cuff of the blood pressure monitor will be placed directly against the skin of your upper arm at the level of your heart. Blood pressure should be measured at least twice using the same arm. Certain conditions can cause a difference in blood pressure between your right and left arms. If you have a high blood pressure reading during one visit or you have normal blood pressure with other risk factors, you may be asked to: Return on a different day to have your blood pressure checked again. Monitor your blood pressure at home for 1 week or longer. If you are diagnosed with hypertension, you may have other blood or imaging tests to help your health care provider understand your overall risk for other conditions. How is this treated? This condition is treated by making healthy lifestyle changes, such as eating healthy foods, exercising more, and reducing your alcohol intake. You may be referred for counseling on a healthy diet and physical activity. Your health care provider may prescribe medicine if lifestyle changes are not enough to get your blood pressure under control and if: Your systolic blood pressure is above 130. Your diastolic blood pressure is above 80. Your personal target blood pressure may vary depending on your medical conditions, your age, and other factors. Follow these instructions at home: Eating and drinking  Eat a diet that is high in fiber and potassium, and low in sodium, added sugar, and fat. An example of this eating plan is called the DASH diet. DASH stands for Dietary Approaches to Stop Hypertension. To eat this way: Eat   plenty of fresh fruits and vegetables. Try to fill one half of your plate at each meal with fruits and vegetables. Eat whole grains, such as whole-wheat pasta, brown rice, or whole-grain bread. Fill about one  fourth of your plate with whole grains. Eat or drink low-fat dairy products, such as skim milk or low-fat yogurt. Avoid fatty cuts of meat, processed or cured meats, and poultry with skin. Fill about one fourth of your plate with lean proteins, such as fish, chicken without skin, beans, eggs, or tofu. Avoid pre-made and processed foods. These tend to be higher in sodium, added sugar, and fat. Reduce your daily sodium intake. Many people with hypertension should eat less than 1,500 mg of sodium a day. Do not drink alcohol if: Your health care provider tells you not to drink. You are pregnant, may be pregnant, or are planning to become pregnant. If you drink alcohol: Limit how much you have to: 0-1 drink a day for women. 0-2 drinks a day for men. Know how much alcohol is in your drink. In the [place] one drink equals one 12 oz bottle of beer (355 mL), one 5 oz glass of wine (148 mL), or one 1 oz glass of hard liquor (44 mL). Lifestyle  Work with your health care provider to maintain a healthy body weight or to lose weight. Ask what an ideal weight is for you. Get at least 30 minutes of exercise that causes your heart to beat faster (aerobic exercise) most days of the week. Activities may include walking, swimming, or biking. Include exercise to strengthen your muscles (resistance exercise), such as Pilates or lifting weights, as part of your weekly exercise routine. Try to do these types of exercises for 30 minutes at least 3 days a week. Do not use any products that contain nicotine or tobacco. These products include cigarettes, chewing tobacco, and vaping devices, such as e-cigarettes. If you need help quitting, ask your health care provider. Monitor your blood pressure at home as told by your health care provider. Keep all follow-up visits. This is important. Medicines Take over-the-counter and prescription medicines only as told by your health care provider. Follow directions carefully. Blood  pressure medicines must be taken as prescribed. Do not skip doses of blood pressure medicine. Doing this puts you at risk for problems and can make the medicine less effective. Ask your health care provider about side effects or reactions to medicines that you should watch for. Contact a health care provider if you: Think you are having a reaction to a medicine you are taking. Have headaches that keep coming back (recurring). Feel dizzy. Have swelling in your ankles. Have trouble with your vision. Get help right away if you: Develop a severe headache or confusion. Have unusual weakness or numbness. Feel faint. Have severe pain in your chest or abdomen. Vomit repeatedly. Have trouble breathing. These symptoms may be an emergency. Get help right away. Call 911. Do not wait to see if the symptoms will go away. Do not drive yourself to the hospital. Summary Hypertension is when the force of blood pumping through your arteries is too strong. If this condition is not controlled, it may put you at risk for serious complications. Your personal target blood pressure may vary depending on your medical conditions, your age, and other factors. For most people, a normal blood pressure is less than 120/80. Hypertension is treated with lifestyle changes, medicines, or a combination of both. Lifestyle changes include losing weight, eating a healthy,   low-sodium diet, exercising more, and limiting alcohol. This information is not intended to replace advice given to you by your health care provider. Make sure you discuss any questions you have with your health care provider. Document Revised: 09/14/2021 Document Reviewed: 09/14/2021 Elsevier Patient Education  2023 [employer]  

## 2022-05-30 ENCOUNTER — Ambulatory Visit (INDEPENDENT_AMBULATORY_CARE_PROVIDER_SITE_OTHER): Payer: BC Managed Care – PPO | Admitting: Physician Assistant

## 2022-05-30 ENCOUNTER — Encounter: Payer: Self-pay | Admitting: Physician Assistant

## 2022-05-30 VITALS — BP 92/68 | HR 99 | Temp 97.7°F | Ht 73.0 in | Wt 274.0 lb

## 2022-05-30 DIAGNOSIS — Z6836 Body mass index (BMI) 36.0-36.9, adult: Secondary | ICD-10-CM | POA: Diagnosis not present

## 2022-05-30 DIAGNOSIS — I10 Essential (primary) hypertension: Secondary | ICD-10-CM | POA: Diagnosis not present

## 2022-05-30 DIAGNOSIS — E7849 Other hyperlipidemia: Secondary | ICD-10-CM

## 2022-05-30 NOTE — Assessment & Plan Note (Addendum)
-  Last lipid panel normal, LDL 93 (previously 76). -Recommend to continue with rosuvastatin 10 mg three times weekly, weight loss efforts and dietary changes including low fat. Recommend to increase physical activity.

## 2022-05-30 NOTE — Assessment & Plan Note (Addendum)
-  Followed by [facility]. -3 pound wt gain since 05/04/22. On Saxenda 1.8 mg. Recommend to continue with diet and lifestyle changes.

## 2022-05-30 NOTE — Assessment & Plan Note (Addendum)
-  Soft blood pressure today. Asymptomatic. Discussed with patient reasonable to consider remaining on Losartan 50 mg or 25 mg and stopping HCTZ 12.5 mg if BP remains on the lower range of normal. Encourage to continue weight loss efforts. Will continue to monitor.

## 2022-05-30 NOTE — Progress Notes (Signed)
Established patient visit   Patient: Edward Mccormick   DOB: [birth year]   61 y.o. Male  MRN: [id number] Visit Date: 05/30/2022  Chief Complaint  Patient presents with   Hypertension   Subjective    HPI  Patient presents for chronic follow-up. Patient followed by [facility]. Reports overall tolerating Saxenda.   HTN: Pt denies chest pain, palpitations, dizziness or lower extremity swelling. Taking medication as directed without side effects. Reports blood pressure fluctuates.   HLD: Pt taking medication three times weekly. Reports has worked on diet changes.    Medications: Outpatient Medications Prior to Visit  Medication Sig   hydrochlorothiazide (HYDRODIURIL) 12.5 MG tablet Take one tablet as needed for lower extremity edema.   Liraglutide -Weight Management (SAXENDA) 18 MG/3ML SOPN Inject 1.8 mg into the skin daily.   losartan (COZAAR) 25 MG tablet Take 1 tablet (25 mg total) by mouth daily.   rosuvastatin (CRESTOR) 10 MG tablet Take 1 tablet (10 mg total) by mouth daily.   VITAMIN D PO Take by mouth.   No facility-administered medications prior to visit.    Review of Systems Review of Systems:  A fourteen system review of systems was performed and found to be positive as per HPI.  Last CBC Lab Results  Component Value Date   WBC 5.4 11/24/2021   HGB 12.8 (L) 11/24/2021   HCT 37.0 (L) 11/24/2021   MCV 90 11/24/2021   MCH 31.1 11/24/2021   RDW 13.4 11/24/2021   PLT 168 31/49/7026   Last metabolic panel Lab Results  Component Value Date   GLUCOSE 92 03/16/2022   NA 140 03/16/2022   K 4.6 03/16/2022   CL 103 03/16/2022   CO2 26 03/16/2022   BUN 13 03/16/2022   CREATININE 1.14 03/16/2022   EGFR 74 03/16/2022   CALCIUM 9.5 03/16/2022   PROT 6.7 03/16/2022   ALBUMIN 4.1 03/16/2022   LABGLOB 2.6 03/16/2022   AGRATIO 1.6 03/16/2022   BILITOT 0.4 03/16/2022   ALKPHOS 47 03/16/2022   AST 22 03/16/2022   ALT 20 03/16/2022   Last lipids Lab  Results  Component Value Date   CHOL 172 03/16/2022   HDL 67 03/16/2022   LDLCALC 93 03/16/2022   LDLDIRECT 145.5 10/14/2011   TRIG 59 03/16/2022   CHOLHDL 2.6 03/16/2022   Last hemoglobin A1c Lab Results  Component Value Date   HGBA1C 5.9 (H) 03/16/2022   Last thyroid functions Lab Results  Component Value Date   TSH 2.690 11/24/2021   T3TOTAL 127 11/26/2019   Last vitamin D Lab Results  Component Value Date   VD25OH 55.9 03/16/2022     Objective    BP 92/68   Pulse 99   Temp 97.7 F (36.5 C)   Ht $R'6\' 1"'cj$  (1.854 m)   Wt 274 lb (124.3 kg)   SpO2 99%   BMI 36.15 kg/m  BP Readings from Last 3 Encounters:  05/30/22 92/68  05/04/22 107/74  04/20/22 108/77   Wt Readings from Last 3 Encounters:  05/30/22 274 lb (124.3 kg)  05/04/22 271 lb (122.9 kg)  04/20/22 273 lb (123.8 kg)    Physical Exam  General:  Well Developed, well nourished, appropriate for stated age.  Neuro:  Alert and oriented,  extra-ocular muscles intact  HEENT:  Normocephalic, atraumatic, neck supple  Skin:  no gross rash, warm, pink. Cardiac:  RRR, S1 S2 Respiratory: CTA B/L  Vascular:  Ext warm, no cyanosis apprec.; cap RF less 2 sec.  Psych:  No HI/SI, judgement and insight good, Euthymic mood. Full Affect.   No results found for any visits on 05/30/22.  Assessment & Plan      Problem List Items Addressed This Visit       Cardiovascular and Mediastinum   Benign essential hypertension    -Soft blood pressure today. Asymptomatic. Discussed with patient reasonable to consider remaining on Losartan 50 mg or 25 mg and stopping HCTZ 12.5 mg if BP remains on the lower range of normal. Encourage to continue weight loss efforts. Will continue to monitor.        Other   Class 2 severe obesity with serious comorbidity and body mass index (BMI) of 36.0 to 36.9 in adult [facility])    -Followed by [facility]. -3 pound wt gain since 05/04/22. On Saxenda 1.8 mg. Recommend to continue with diet and lifestyle  changes.       Other hyperlipidemia - Primary    -Last lipid panel normal, LDL 93 (previously 76). -Recommend to continue with rosuvastatin 10 mg three times weekly, weight loss efforts and dietary changes including low fat. Recommend to increase physical activity.       Return in about 6 months (around 11/30/2022) for CPE.        Lorrene Reid, PA-C  [facility] [phone] (phone) [phone] (fax)  [place]

## 2022-06-01 ENCOUNTER — Encounter (INDEPENDENT_AMBULATORY_CARE_PROVIDER_SITE_OTHER): Payer: Self-pay | Admitting: Adult Health

## 2022-06-01 ENCOUNTER — Ambulatory Visit (INDEPENDENT_AMBULATORY_CARE_PROVIDER_SITE_OTHER): Payer: BC Managed Care – PPO | Admitting: Adult Health

## 2022-06-01 VITALS — BP 91/62 | HR 83 | Temp 97.6°F | Ht 73.0 in | Wt 271.0 lb

## 2022-06-01 DIAGNOSIS — E7849 Other hyperlipidemia: Secondary | ICD-10-CM

## 2022-06-01 DIAGNOSIS — I10 Essential (primary) hypertension: Secondary | ICD-10-CM | POA: Diagnosis not present

## 2022-06-01 DIAGNOSIS — Z9189 Other specified personal risk factors, not elsewhere classified: Secondary | ICD-10-CM

## 2022-06-01 DIAGNOSIS — E669 Obesity, unspecified: Secondary | ICD-10-CM

## 2022-06-01 DIAGNOSIS — Z6836 Body mass index (BMI) 36.0-36.9, adult: Secondary | ICD-10-CM

## 2022-06-01 MED ORDER — SAXENDA 18 MG/3ML ~~LOC~~ SOPN: 1.8000 mg | PEN_INJECTOR | Freq: Every day | SUBCUTANEOUS | 0 refills | Status: DC

## 2022-06-01 MED ORDER — LOSARTAN POTASSIUM 25 MG PO TABS: 25.0000 mg | ORAL_TABLET | Freq: Every day | ORAL | 0 refills | Status: DC

## 2022-06-01 MED ORDER — BD PEN NEEDLE NANO 2ND GEN 32G X 4 MM MISC: 1.0000 | Freq: Two times a day (BID) | 0 refills | Status: DC

## 2022-06-01 NOTE — Progress Notes (Signed)
Chief Complaint:   OBESITY Mutaz is here to discuss his progress with his obesity treatment plan along with follow-up of his obesity related diagnoses. Inri is on practicing portion control and making smarter food choices, such as increasing vegetables and decreasing simple carbohydrates and states he is following his eating plan approximately 80% of the time. Rayshod states he is walking for 20 minutes 2 times per week.  Today's visit was #: 27 Starting weight: 295 lbs Starting date: 11/26/2019 Today's weight: 271 lbs Today's date: 06/01/2022 Total lbs lost to date: 24 lbs Total lbs lost since last in-office visit: 0  Interim History:  05/30/2022 PCP visit-no med changes, no labs completed.  Bioimpedance results reviewed with pt: Adipose mass -1.6 pounds.  03/16/2022 Started on Saxenda 0.6 mg once daily  GLP-1 dose increased to 1.8 mg on 05/04/2022 (4 weeks ago). He is on Saxenda 1.8 mg once daily currently-denies side effects.   Subjective:   1. Essential hypertension BP soft at office visit.   He denies symptoms of hypotension.   Currently on losartan 50 mg once daily and HCTZ 12.5 mg once daily-he held HCTZ for the past 2 days. Home readings-checks sporadically, systolic blood pressure 110s, diastolic blood pressure 70s.  2. Other hyperlipidemia Last 2 lipid panel is at goal. He is on Crestor 10 mg 3 times per week. He denies myalgia's.  3. At risk for complication associated with hypotension Related to steady weight loss, on losartan 50mg  and until recently HCTZ 12.5mg  QD (off 2 days).  4. Obesity, current BMI 36.1 Related to steady weight loss, on losartan.  Assessment/Plan:   1. Essential hypertension Remain off HCTZ 12.5 mg, decrease losartan dose to 25 mg once daily.  Refill - losartan (COZAAR) 25 MG tablet; Take 1 tablet (25 mg total) by mouth daily.  Dispense: 90 tablet; Refill: 0  2. Other hyperlipidemia Continue Crestor as directed.   Decrease  saturated fat. Remain as active as possible.  3. At risk for complication associated with hypotension Jaimeson was given approximately 15 minutes of education and counseling today to help avoid hypotension. We discussed risks of hypotension with weight loss and signs of hypotension such as feeling lightheaded or unsteady.  Repetitive spaced learning was employed today to elicit superior memory formation and behavioral change.   4. Obesity, current BMI 36.1 Refill - Liraglutide -Weight Management (SAXENDA) 18 MG/3ML SOPN; Inject 1.8 mg into the skin daily.  Dispense: 9 mL; Refill: 0 - Insulin Pen Needle (BD PEN NEEDLE NANO 2ND GEN) 32G X 4 MM MISC; 1 Package by Does not apply route in the morning and at bedtime.  Dispense: 100 each; Refill: 0  Ridhwan is currently in the action stage of change. As such, his goal is to continue with weight loss efforts. He has agreed to practicing portion control and making smarter food choices, such as increasing vegetables and decreasing simple carbohydrates.   Exercise goals: Increase daily walking to 3-4 times per week.  Behavioral modification strategies: increasing lean protein intake, decreasing simple carbohydrates, meal planning and cooking strategies, keeping healthy foods in the home, avoiding temptations, and planning for success.  Alika has agreed to follow-up with our clinic in 4 weeks. He was informed of the importance of frequent follow-up visits to maximize his success with intensive lifestyle modifications for his multiple health conditions.   Objective:   Blood pressure 91/62, pulse 83, temperature 97.6 F (36.4 C), height 6\' 1"  (1.854 m), weight 271 lb (122.9 kg),  SpO2 100 %. Body mass index is 35.75 kg/m.  General: Cooperative, alert, well developed, in no acute distress. HEENT: Conjunctivae and lids unremarkable. Cardiovascular: Regular rhythm.  Lungs: Normal work of breathing. Neurologic: No focal deficits.   Lab Results  Component  Value Date   CREATININE 1.14 03/16/2022   BUN 13 03/16/2022   NA 140 03/16/2022   K 4.6 03/16/2022   CL 103 03/16/2022   CO2 26 03/16/2022   Lab Results  Component Value Date   ALT 20 03/16/2022   AST 22 03/16/2022   ALKPHOS 47 03/16/2022   BILITOT 0.4 03/16/2022   Lab Results  Component Value Date   HGBA1C 5.9 (H) 03/16/2022   HGBA1C 5.8 (H) 11/24/2021   HGBA1C 5.6 04/28/2021   HGBA1C 5.8 (H) 01/20/2021   HGBA1C 5.7 (H) 10/21/2020   Lab Results  Component Value Date   INSULIN 15.7 03/16/2022   INSULIN 8.8 04/28/2021   INSULIN 8.7 01/20/2021   INSULIN 10.3 10/21/2020   INSULIN 11.0 06/30/2020   Lab Results  Component Value Date   TSH 2.690 11/24/2021   Lab Results  Component Value Date   CHOL 172 03/16/2022   HDL 67 03/16/2022   LDLCALC 93 03/16/2022   LDLDIRECT 145.5 10/14/2011   TRIG 59 03/16/2022   CHOLHDL 2.6 03/16/2022   Lab Results  Component Value Date   VD25OH 55.9 03/16/2022   VD25OH 48.4 04/28/2021   VD25OH 62.3 01/20/2021   Lab Results  Component Value Date   WBC 5.4 11/24/2021   HGB 12.8 (L) 11/24/2021   HCT 37.0 (L) 11/24/2021   MCV 90 11/24/2021   PLT 168 11/24/2021   No results found for: "IRON", "TIBC", "FERRITIN"   Attestation Statements:   Reviewed by clinician on day of visit: allergies, medications, problem list, medical history, surgical history, family history, social history, and previous encounter notes.  I, Jesse Sans, FNP, am acting as Energy manager for William Hamburger, NP.  I have reviewed the above documentation for accuracy and completeness, and I agree with the above. -  Marilea Gwynne d. Kayan Blissett, NP-C

## 2022-06-08 ENCOUNTER — Encounter (INDEPENDENT_AMBULATORY_CARE_PROVIDER_SITE_OTHER): Payer: Self-pay | Admitting: Adult Health

## 2022-06-29 ENCOUNTER — Encounter (INDEPENDENT_AMBULATORY_CARE_PROVIDER_SITE_OTHER): Payer: Self-pay | Admitting: Adult Health

## 2022-06-29 ENCOUNTER — Ambulatory Visit (INDEPENDENT_AMBULATORY_CARE_PROVIDER_SITE_OTHER): Payer: BC Managed Care – PPO | Admitting: Adult Health

## 2022-06-29 ENCOUNTER — Encounter (INDEPENDENT_AMBULATORY_CARE_PROVIDER_SITE_OTHER): Payer: Self-pay

## 2022-06-29 VITALS — BP 117/86 | HR 72 | Temp 98.0°F | Ht 73.0 in | Wt 270.0 lb

## 2022-06-29 DIAGNOSIS — I10 Essential (primary) hypertension: Secondary | ICD-10-CM | POA: Diagnosis not present

## 2022-06-29 DIAGNOSIS — E669 Obesity, unspecified: Secondary | ICD-10-CM

## 2022-06-29 DIAGNOSIS — Z9189 Other specified personal risk factors, not elsewhere classified: Secondary | ICD-10-CM

## 2022-06-29 DIAGNOSIS — Z6835 Body mass index (BMI) 35.0-35.9, adult: Secondary | ICD-10-CM | POA: Diagnosis not present

## 2022-06-29 MED ORDER — LOSARTAN POTASSIUM-HCTZ 50-12.5 MG PO TABS
1.0000 | ORAL_TABLET | Freq: Every day | ORAL | 0 refills | Status: DC
  Filled 2022-07-08: qty 90, 90d supply, fill #0

## 2022-06-29 MED ORDER — BD PEN NEEDLE NANO 2ND GEN 32G X 4 MM MISC: 1.0000 | Freq: Two times a day (BID) | 0 refills | Status: DC

## 2022-06-29 MED ORDER — SAXENDA 18 MG/3ML ~~LOC~~ SOPN: 2.4000 mg | PEN_INJECTOR | Freq: Every day | SUBCUTANEOUS | 0 refills | Status: DC

## 2022-07-06 DIAGNOSIS — Z9189 Other specified personal risk factors, not elsewhere classified: Secondary | ICD-10-CM | POA: Insufficient documentation

## 2022-07-06 NOTE — Progress Notes (Unsigned)
Chief Complaint:   OBESITY Edward Mccormick is here to discuss his progress with his obesity treatment plan along with follow-up of his obesity related diagnoses. Edward Mccormick is on practicing portion control and making smarter food choices, such as increasing vegetables and decreasing simple carbohydrates and states he is following his eating plan approximately 80% of the time. Edward Mccormick states he is walking 30 minutes 2 times per week.  Today's visit was #: 28 Starting weight: 295 lbs Starting date: 11/26/2019 Today's weight: 270 lbs Today's date: 06/29/2022 Total lbs lost to date: 25 lbs Total lbs lost since last in-office visit: 1 lb  Interim History: He is currently on Saxenda 1.8 mg daily ***  Subjective:   1. Essential hypertension He is currently on Losartan 50 mg daily.  Increased from 25 mg daily on 06/08/2022 due to blood pressures trending up.  Most recent home readings SBP: 120-140's, DBP 80-90's.   2. At risk for constipation Edward Mccormick is at increased risk for constipation due to inadequate water intake, changes in diet, and/or use of medications such as GLP1 agonists. Edward Mccormick denies hard, infrequent stools currently.  Also at risk due to increase in Saxenda.    Assessment/Plan:   1. Essential hypertension Change [place] 50/12.5 daily.  Increase water intake.  Refill- losartan-hydrochlorothiazide (HYZAAR) 50-12.5 MG tablet; Take 1 tablet by mouth daily.  Dispense: 90 tablet; Refill: 0  2. At risk for constipation Edward Mccormick was informed that a decrease in bowel movement frequency is normal while losing weight, but stools should not be hard or painful. Orders and follow up as documented in patient record.   Counseling Getting to Good Bowel Health: Your goal is to have one soft bowel movement each day. Drink at least 8 glasses of water each day. Eat plenty of fiber (goal is over 25 grams each day). It is best to get most of your fiber from dietary sources which includes leafy green  vegetables, fresh fruit, and whole grains. You may need to add fiber with the help of OTC fiber supplements. These include Metamucil, Citrucel, and Flaxseed. If you are still having trouble, try adding Miralax or Magnesium Citrate. If all of these changes do not work, Cabin crew.   3. Obesity, current BMI 35.6 Increase protein at each meal.   Refill - Liraglutide -Weight Management (SAXENDA) 18 MG/3ML SOPN; Inject 2.4 mg into the skin daily.  Dispense: 9 mL; Refill: 0 Refill - Insulin Pen Needle (BD PEN NEEDLE NANO 2ND GEN) 32G X 4 MM MISC; 1 Package by Does not apply route in the morning and at bedtime.  Dispense: 100 each; Refill: 0  Edward Mccormick is currently in the action stage of change. As such, his goal is to continue with weight loss efforts. He has agreed to practicing portion control and making smarter food choices, such as increasing vegetables and decreasing simple carbohydrates.   Exercise goals:  As is.   Behavioral modification strategies: increasing lean protein intake, decreasing simple carbohydrates, meal planning and cooking strategies, keeping healthy foods in the home, and planning for success.  Edward Mccormick has agreed to follow-up with our clinic in 4 weeks. He was informed of the importance of frequent follow-up visits to maximize his success with intensive lifestyle modifications for his multiple health conditions.   Objective:   Blood pressure 117/86, pulse 72, temperature 98 F (36.7 C), height '6\' 1"'$  (1.854 m), weight 270 lb (122.5 kg), SpO2 98 %. Body mass index is 35.62 kg/m.  General: Cooperative, alert, well  developed, in no acute distress. HEENT: Conjunctivae and lids unremarkable. Cardiovascular: Regular rhythm.  Lungs: Normal work of breathing. Neurologic: No focal deficits.   Lab Results  Component Value Date   CREATININE 1.14 03/16/2022   BUN 13 03/16/2022   NA 140 03/16/2022   K 4.6 03/16/2022   CL 103 03/16/2022   CO2 26 03/16/2022   Lab Results   Component Value Date   ALT 20 03/16/2022   AST 22 03/16/2022   ALKPHOS 47 03/16/2022   BILITOT 0.4 03/16/2022   Lab Results  Component Value Date   HGBA1C 5.9 (H) 03/16/2022   HGBA1C 5.8 (H) 11/24/2021   HGBA1C 5.6 04/28/2021   HGBA1C 5.8 (H) 01/20/2021   HGBA1C 5.7 (H) 10/21/2020   Lab Results  Component Value Date   INSULIN 15.7 03/16/2022   INSULIN 8.8 04/28/2021   INSULIN 8.7 01/20/2021   INSULIN 10.3 10/21/2020   INSULIN 11.0 06/30/2020   Lab Results  Component Value Date   TSH 2.690 11/24/2021   Lab Results  Component Value Date   CHOL 172 03/16/2022   HDL 67 03/16/2022   LDLCALC 93 03/16/2022   LDLDIRECT 145.5 10/14/2011   TRIG 59 03/16/2022   CHOLHDL 2.6 03/16/2022   Lab Results  Component Value Date   VD25OH 55.9 03/16/2022   VD25OH 48.4 04/28/2021   VD25OH 62.3 01/20/2021   Lab Results  Component Value Date   WBC 5.4 11/24/2021   HGB 12.8 (L) 11/24/2021   HCT 37.0 (L) 11/24/2021   MCV 90 11/24/2021   PLT 168 11/24/2021   No results found for: "IRON", "TIBC", "FERRITIN"  Attestation Statements:   Reviewed by clinician on day of visit: allergies, medications, problem list, medical history, surgical history, family history, social history, and previous encounter notes.  I, Davy Pique, RMA, am acting as Location manager for Mina Marble, NP.  I have reviewed the above documentation for accuracy and completeness, and I agree with the above. -  ***

## 2022-07-08 ENCOUNTER — Other Ambulatory Visit (HOSPITAL_COMMUNITY): Payer: Self-pay

## 2022-07-19 ENCOUNTER — Telehealth (INDEPENDENT_AMBULATORY_CARE_PROVIDER_SITE_OTHER): Payer: Self-pay | Admitting: Adult Health

## 2022-07-19 ENCOUNTER — Encounter (INDEPENDENT_AMBULATORY_CARE_PROVIDER_SITE_OTHER): Payer: Self-pay

## 2022-07-19 NOTE — Telephone Encounter (Signed)
Edward Mccormick - Prior authorization approved for Saxenda. Effective:  07/18/2022 - 07/19/2023. Patient sent approval message via mychart.

## 2022-07-26 ENCOUNTER — Other Ambulatory Visit (HOSPITAL_COMMUNITY): Payer: Self-pay

## 2022-08-02 ENCOUNTER — Ambulatory Visit (INDEPENDENT_AMBULATORY_CARE_PROVIDER_SITE_OTHER): Payer: BC Managed Care – PPO | Admitting: Adult Health

## 2022-08-03 ENCOUNTER — Ambulatory Visit (INDEPENDENT_AMBULATORY_CARE_PROVIDER_SITE_OTHER): Payer: BC Managed Care – PPO | Admitting: Adult Health

## 2022-08-03 ENCOUNTER — Encounter (INDEPENDENT_AMBULATORY_CARE_PROVIDER_SITE_OTHER): Payer: Self-pay | Admitting: Adult Health

## 2022-08-03 VITALS — BP 119/81 | HR 69 | Temp 98.2°F | Ht 73.0 in | Wt 272.0 lb

## 2022-08-03 DIAGNOSIS — E669 Obesity, unspecified: Secondary | ICD-10-CM | POA: Diagnosis not present

## 2022-08-03 DIAGNOSIS — I10 Essential (primary) hypertension: Secondary | ICD-10-CM

## 2022-08-03 DIAGNOSIS — Z6835 Body mass index (BMI) 35.0-35.9, adult: Secondary | ICD-10-CM

## 2022-08-03 MED ORDER — LOSARTAN POTASSIUM 50 MG PO TABS: 50.0000 mg | ORAL_TABLET | Freq: Every day | ORAL | 0 refills | Status: DC

## 2022-08-03 MED ORDER — SAXENDA 18 MG/3ML ~~LOC~~ SOPN: 2.4000 mg | PEN_INJECTOR | Freq: Every day | SUBCUTANEOUS | 0 refills | Status: DC

## 2022-08-04 NOTE — Progress Notes (Unsigned)
Chief Complaint:   OBESITY Edward Mccormick is here to discuss his progress with his obesity treatment plan along with follow-up of his obesity related diagnoses. Edward Mccormick is on practicing portion control and making smarter food choices, such as increasing vegetables and decreasing simple carbohydrates and states he is following his eating plan approximately 80% of the time. Edward Mccormick states he is not exercising.  Today's visit was #: 55 Starting weight: 295 lbs Starting date: 11/26/2019 Today's weight: 272 lbs Today's date: 08/03/2022 Total lbs lost to date: 23 lbs Total lbs lost since last in-office visit: +2 lbs  Interim History: wife prepares most meals.  He estimates to consume 25% of meals out.  He did increase Saxenda to 2.4 mg daily for four weeks.  Then last week decreased Saxenda 1.8 mg daily to preserve supply due to national drug shortage.  Breakfast:  oatmeal or skips breakfast Lunch:  meat, protein sandwich, or chic fil a sandwich. Dinner:  left overs   Subjective:   1. Essential hypertension Blood pressure excellent at office visit. He is on ***, losartan 50 mg daily.    Assessment/Plan:   1. Essential hypertension Continue and refill.  Refill - losartan (COZAAR) 50 MG tablet; Take 1 tablet (50 mg total) by mouth daily.  Dispense: 90 tablet; Refill: 0  2. Obesity, current BMI 35.9 Handouts:  eat out guide  Refill - Liraglutide -Weight Management (SAXENDA) 18 MG/3ML SOPN; Inject 2.4 mg into the skin daily.  Dispense: 9 mL; Refill: 0  Edward Mccormick is currently in the action stage of change. As such, his goal is to continue with weight loss efforts. He has agreed to practicing portion control and making smarter food choices, such as increasing vegetables and decreasing simple carbohydrates.   Exercise goals:  as is.   Behavioral modification strategies: increasing lean protein intake, decreasing simple carbohydrates, meal planning and cooking strategies, keeping healthy foods in  the home, and planning for success.  Edward Mccormick has agreed to follow-up with our clinic in 4 weeks. He was informed of the importance of frequent follow-up visits to maximize his success with intensive lifestyle modifications for his multiple health conditions.   Objective:   Blood pressure 119/81, pulse 69, temperature 98.2 F (36.8 C), height '6\' 1"'$  (1.854 m), weight 272 lb (123.4 kg), SpO2 98 %. Body mass index is 35.89 kg/m.  General: Cooperative, alert, well developed, in no acute distress. HEENT: Conjunctivae and lids unremarkable. Cardiovascular: Regular rhythm.  Lungs: Normal work of breathing. Neurologic: No focal deficits.   Lab Results  Component Value Date   CREATININE 1.14 03/16/2022   BUN 13 03/16/2022   NA 140 03/16/2022   K 4.6 03/16/2022   CL 103 03/16/2022   CO2 26 03/16/2022   Lab Results  Component Value Date   ALT 20 03/16/2022   AST 22 03/16/2022   ALKPHOS 47 03/16/2022   BILITOT 0.4 03/16/2022   Lab Results  Component Value Date   HGBA1C 5.9 (H) 03/16/2022   HGBA1C 5.8 (H) 11/24/2021   HGBA1C 5.6 04/28/2021   HGBA1C 5.8 (H) 01/20/2021   HGBA1C 5.7 (H) 10/21/2020   Lab Results  Component Value Date   INSULIN 15.7 03/16/2022   INSULIN 8.8 04/28/2021   INSULIN 8.7 01/20/2021   INSULIN 10.3 10/21/2020   INSULIN 11.0 06/30/2020   Lab Results  Component Value Date   TSH 2.690 11/24/2021   Lab Results  Component Value Date   CHOL 172 03/16/2022   HDL 67 03/16/2022  LDLCALC 93 03/16/2022   LDLDIRECT 145.5 10/14/2011   TRIG 59 03/16/2022   CHOLHDL 2.6 03/16/2022   Lab Results  Component Value Date   VD25OH 55.9 03/16/2022   VD25OH 48.4 04/28/2021   VD25OH 62.3 01/20/2021   Lab Results  Component Value Date   WBC 5.4 11/24/2021   HGB 12.8 (L) 11/24/2021   HCT 37.0 (L) 11/24/2021   MCV 90 11/24/2021   PLT 168 11/24/2021   No results found for: "IRON", "TIBC", "FERRITIN"  Attestation Statements:   Reviewed by clinician on day of  visit: allergies, medications, problem list, medical history, surgical history, family history, social history, and previous encounter notes.  I, Davy Pique, RMA, am acting as Location manager for Mina Marble, NP.  I have reviewed the above documentation for accuracy and completeness, and I agree with the above. -  ***

## 2022-08-31 ENCOUNTER — Encounter (INDEPENDENT_AMBULATORY_CARE_PROVIDER_SITE_OTHER): Payer: Self-pay | Admitting: Adult Health

## 2022-08-31 ENCOUNTER — Ambulatory Visit (INDEPENDENT_AMBULATORY_CARE_PROVIDER_SITE_OTHER): Payer: BC Managed Care – PPO | Admitting: Adult Health

## 2022-08-31 VITALS — BP 122/88 | HR 68 | Temp 97.8°F | Ht 73.0 in | Wt 273.0 lb

## 2022-08-31 DIAGNOSIS — R7303 Prediabetes: Secondary | ICD-10-CM

## 2022-08-31 DIAGNOSIS — E669 Obesity, unspecified: Secondary | ICD-10-CM | POA: Diagnosis not present

## 2022-08-31 DIAGNOSIS — E7849 Other hyperlipidemia: Secondary | ICD-10-CM | POA: Diagnosis not present

## 2022-08-31 DIAGNOSIS — Z6836 Body mass index (BMI) 36.0-36.9, adult: Secondary | ICD-10-CM

## 2022-08-31 DIAGNOSIS — E559 Vitamin D deficiency, unspecified: Secondary | ICD-10-CM | POA: Diagnosis not present

## 2022-08-31 MED ORDER — SAXENDA 18 MG/3ML ~~LOC~~ SOPN: 2.4000 mg | PEN_INJECTOR | Freq: Every day | SUBCUTANEOUS | 0 refills | Status: DC

## 2022-09-01 LAB — COMPREHENSIVE METABOLIC PANEL
ALT: 43 IU/L (ref 0–44)
AST: 28 IU/L (ref 0–40)
Albumin/Globulin Ratio: 1.6 (ref 1.2–2.2)
Albumin: 3.8 g/dL (ref 3.8–4.9)
Alkaline Phosphatase: 52 IU/L (ref 44–121)
BUN/Creatinine Ratio: 10 (ref 10–24)
BUN: 11 mg/dL (ref 8–27)
Bilirubin Total: 0.4 mg/dL (ref 0.0–1.2)
CO2: 24 mmol/L (ref 20–29)
Calcium: 9 mg/dL (ref 8.6–10.2)
Chloride: 104 mmol/L (ref 96–106)
Creatinine, Ser: 1.06 mg/dL (ref 0.76–1.27)
Globulin, Total: 2.4 g/dL (ref 1.5–4.5)
Glucose: 90 mg/dL (ref 70–99)
Potassium: 4.4 mmol/L (ref 3.5–5.2)
Sodium: 143 mmol/L (ref 134–144)
Total Protein: 6.2 g/dL (ref 6.0–8.5)
eGFR: 80 mL/min/{1.73_m2} (ref >59)

## 2022-09-01 LAB — LIPID PANEL
Chol/HDL Ratio: 2.5 ratio (ref 0.0–5.0)
Cholesterol, Total: 152 mg/dL (ref 100–199)
HDL: 61 mg/dL (ref >39)
LDL Chol Calc (NIH): 75 mg/dL (ref 0–99)
Triglycerides: 85 mg/dL (ref 0–149)
VLDL Cholesterol Cal: 16 mg/dL (ref 5–40)

## 2022-09-01 LAB — HEMOGLOBIN A1C
Est. average glucose Bld gHb Est-mCnc: 114 mg/dL
Hgb A1c MFr Bld: 5.6 % (ref 4.8–5.6)

## 2022-09-01 LAB — INSULIN, RANDOM: INSULIN: 15.7 u[IU]/mL (ref 2.6–24.9)

## 2022-09-01 LAB — VITAMIN D 25 HYDROXY (VIT D DEFICIENCY, FRACTURES): Vit D, 25-Hydroxy: 49.3 ng/mL (ref 30.0–100.0)

## 2022-09-06 NOTE — Progress Notes (Signed)
Chief Complaint:   OBESITY Edward Mccormick is here to discuss his progress with his obesity treatment plan along with follow-up of his obesity related diagnoses. Edward Mccormick is on practicing portion control and making smarter food choices, such as increasing vegetables and decreasing simple carbohydrates and states he is following his eating plan approximately 75% of the time. Edward Mccormick states he is not exercising.   Today's visit was #: 30 Starting weight: 295 lbs Starting date: 11/26/2019 Today's weight: 273 lbs Today's date: 08/31/2022 Total lbs lost to date: 22 lbs Total lbs lost since last in-office visit: +1 lb  Interim History:  Saxenda approved 07/13/2022-06/2023.   Saxenda 2.4 mg daily, last dose 08/17/22- unable to continue due to national drug shortage.    He endorses increased stress at work.   He plans to take some time off in the fall and winter months.   Subjective:   1. Other hyperlipidemia He is taking Crestor 10 mg, Monday, Wednesday, Friday.  He denies myalgias.   2. Prediabetes He has been off Saxenda 2.4 mg daily since 08/17/2022 due to national drug shortage. He endorses increased CHO cravings since off GLP-1 therapy.   3. Vitamin D deficiency He is on OTC Vitamin D-3, 1,000 IU daily.    Assessment/Plan:   1. Other hyperlipidemia Check labs today.   - Lipid panel  2. Prediabetes Check labs today.   - Comprehensive metabolic panel - Hemoglobin A1c - Insulin, random  3. Vitamin D deficiency Check labs today.   - VITAMIN D 25 Hydroxy (Vit-D Deficiency, Fractures)  4. Obesity, current BMI 36.1 Unable to obtain due to national drug shortage.   Refill - Liraglutide -Weight Management (SAXENDA) 18 MG/3ML SOPN; Inject 2.4 mg into the skin daily.  Dispense: 9 mL; Refill: 0  Edward Mccormick is currently in the action stage of change. As such, his goal is to continue with weight loss efforts. He has agreed to practicing portion control and making smarter food choices, such  as increasing vegetables and decreasing simple carbohydrates.   Exercise goals: All adults should avoid inactivity. Some physical activity is better than none, and adults who participate in any amount of physical activity gain some health benefits.  Behavioral modification strategies: increasing lean protein intake, decreasing simple carbohydrates, meal planning and cooking strategies, keeping healthy foods in the home, and planning for success.  Edward Mccormick has agreed to follow-up with our clinic in 4 weeks. He was informed of the importance of frequent follow-up visits to maximize his success with intensive lifestyle modifications for his multiple health conditions.   Edward Mccormick was informed we would discuss his lab results at his next visit unless there is a critical issue that needs to be addressed sooner. Edward Mccormick agreed to keep his next visit at the agreed upon time to discuss these results.  Objective:   Blood pressure 122/88, pulse 68, temperature 97.8 F (36.6 C), height '6\' 1"'$  (1.854 m), weight 273 lb (123.8 kg), SpO2 97 %. Body mass index is 36.02 kg/m.  General: Cooperative, alert, well developed, in no acute distress. HEENT: Conjunctivae and lids unremarkable. Cardiovascular: Regular rhythm.  Lungs: Normal work of breathing. Neurologic: No focal deficits.   Lab Results  Component Value Date   CREATININE 1.06 08/31/2022   BUN 11 08/31/2022   NA 143 08/31/2022   K 4.4 08/31/2022   CL 104 08/31/2022   CO2 24 08/31/2022   Lab Results  Component Value Date   ALT 43 08/31/2022   AST 28 08/31/2022  ALKPHOS 52 08/31/2022   BILITOT 0.4 08/31/2022   Lab Results  Component Value Date   HGBA1C 5.6 08/31/2022   HGBA1C 5.9 (H) 03/16/2022   HGBA1C 5.8 (H) 11/24/2021   HGBA1C 5.6 04/28/2021   HGBA1C 5.8 (H) 01/20/2021   Lab Results  Component Value Date   INSULIN 15.7 08/31/2022   INSULIN 15.7 03/16/2022   INSULIN 8.8 04/28/2021   INSULIN 8.7 01/20/2021   INSULIN 10.3 10/21/2020    Lab Results  Component Value Date   TSH 2.690 11/24/2021   Lab Results  Component Value Date   CHOL 152 08/31/2022   HDL 61 08/31/2022   LDLCALC 75 08/31/2022   LDLDIRECT 145.5 10/14/2011   TRIG 85 08/31/2022   CHOLHDL 2.5 08/31/2022   Lab Results  Component Value Date   VD25OH 49.3 08/31/2022   VD25OH 55.9 03/16/2022   VD25OH 48.4 04/28/2021   Lab Results  Component Value Date   WBC 5.4 11/24/2021   HGB 12.8 (L) 11/24/2021   HCT 37.0 (L) 11/24/2021   MCV 90 11/24/2021   PLT 168 11/24/2021   No results found for: "IRON", "TIBC", "FERRITIN"  Attestation Statements:   Reviewed by clinician on day of visit: allergies, medications, problem list, medical history, surgical history, family history, social history, and previous encounter notes.  I, Edward Mccormick, RMA, am acting as Location manager for Mina Marble, NP.  I have reviewed the above documentation for accuracy and completeness, and I agree with the above. -  Edward Klassen d. Blakely Maranan, NP-C

## 2022-10-06 IMAGING — MR MR SHOULDER*R* W/O CM
4 of 5 series · 20 of 40 positions shown · non-contrast
Comparison: Shoulder radiographs dated October 01, 2021

CLINICAL DATA: Shoulder trauma, rotator cuff tear suspected.
Shoulder pain for 2 months.

EXAM:
MRI OF THE RIGHT SHOULDER WITHOUT CONTRAST
TECHNIQUE: Multiplanar, multisequence MR imaging of the shoulder was performed.
No intravenous contrast was administered.

[Series 6: T2 fat-sat · axial · right · 3.0mm · 0.47mm/px · z∈[-39,+50]mm · 7 of 27 slices shown (1 of 3)]
[im 1/27]
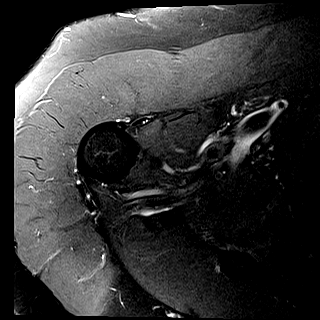
[im 3/27]
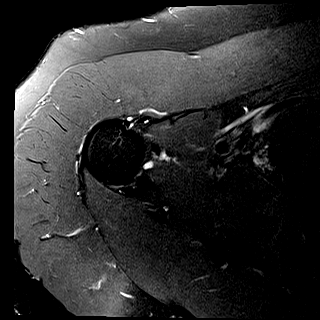
[im 9/27]
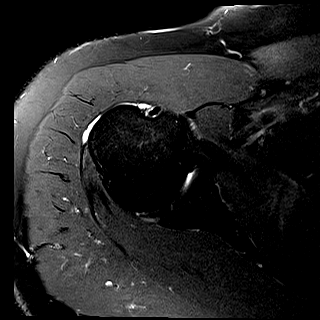
[im 12/27]
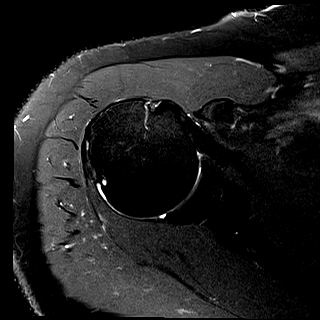
[im 15/27]
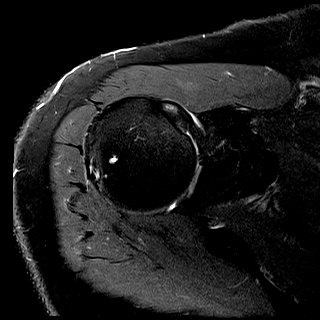
[im 18/27]
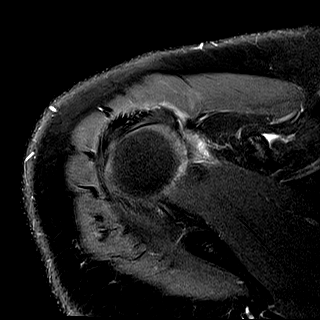
[im 24/27]
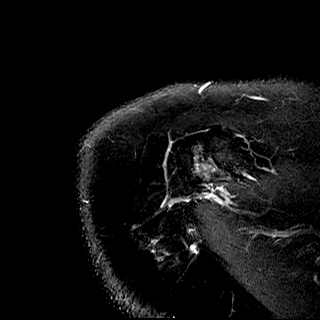

[Series 7: T2 fat-sat · coronal · right · 4.0mm · 0.22mm/px · 3 of 21 slices shown (2 of 3)]
[im 4/21]
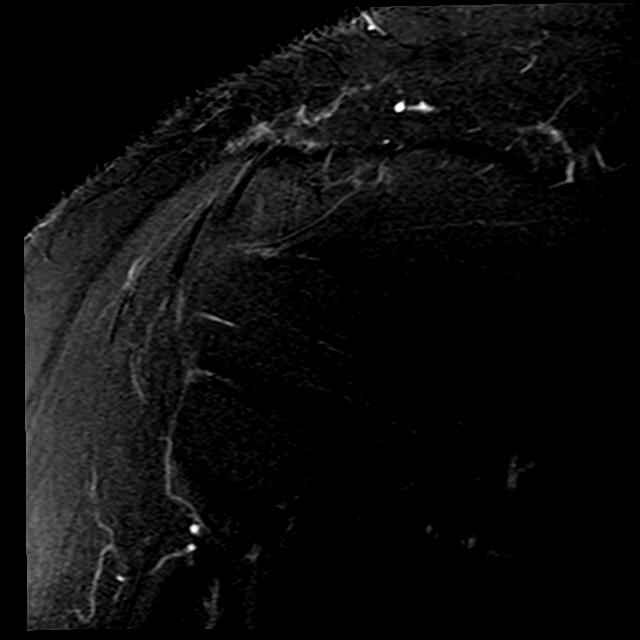
[im 11/21]
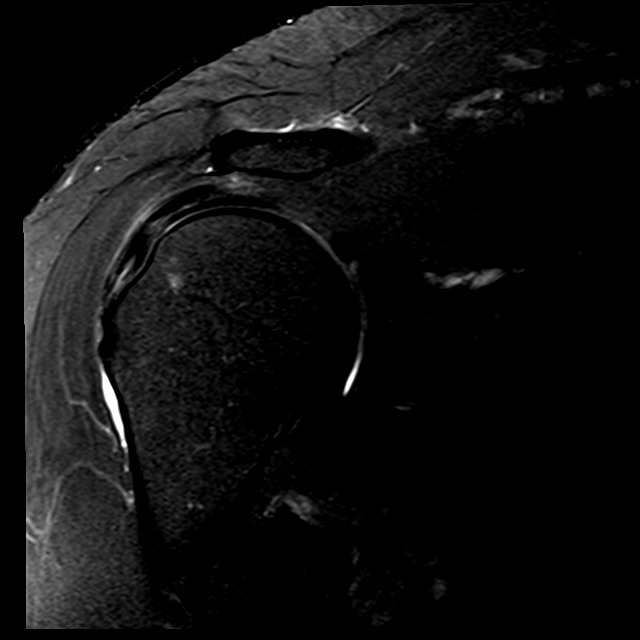
[im 17/21]
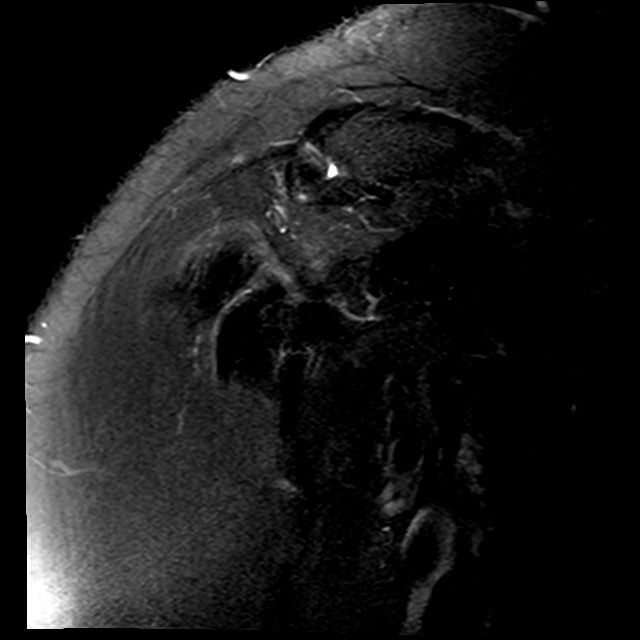

[Series 8: PD · coronal · right · 4.0mm · 0.22mm/px · 7 of 21 slices shown]
[im 1/21]
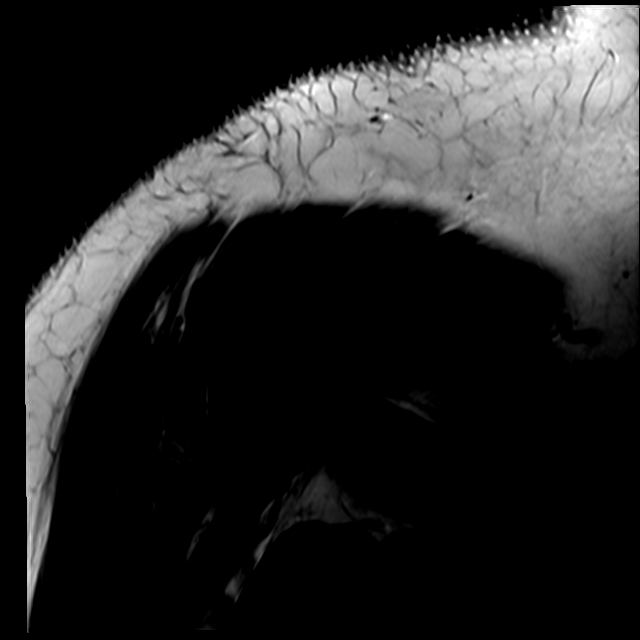
[im 4/21]
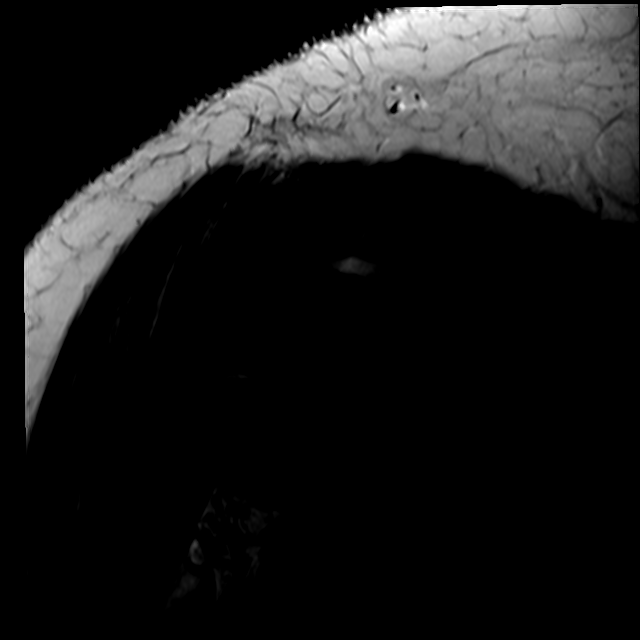
[im 7/21]
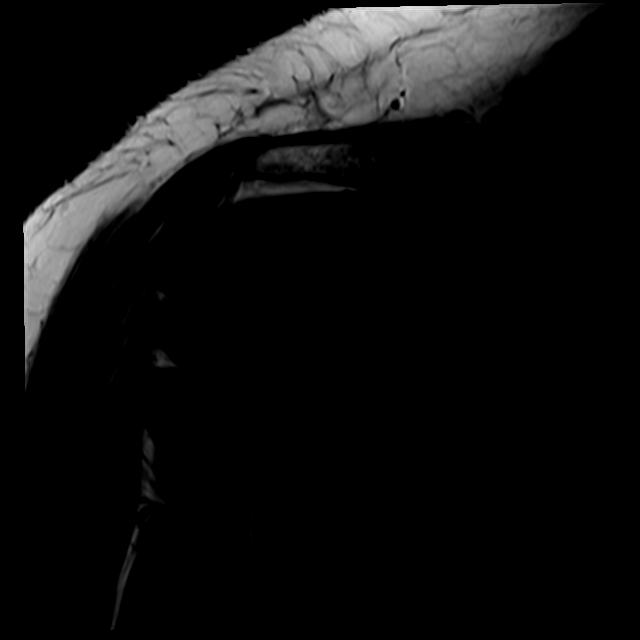
[im 11/21]
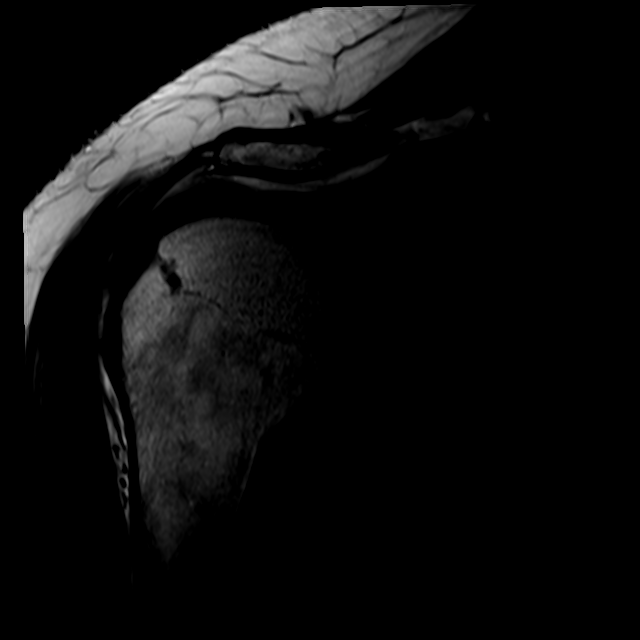
[im 14/21]
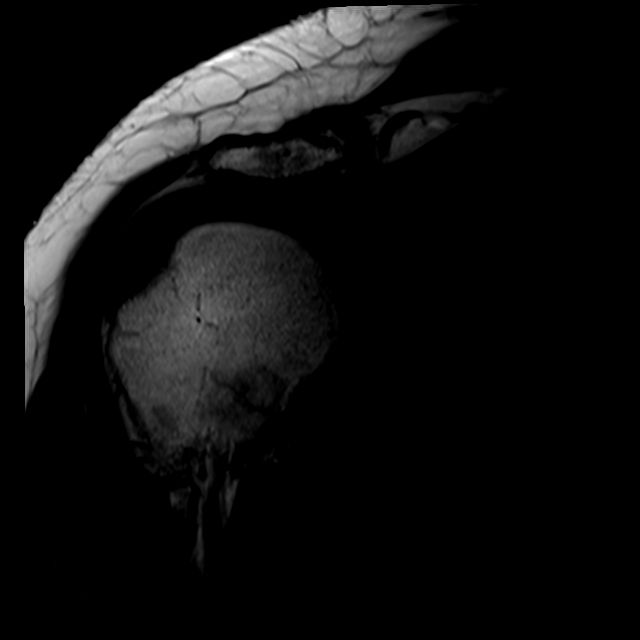
[im 17/21]
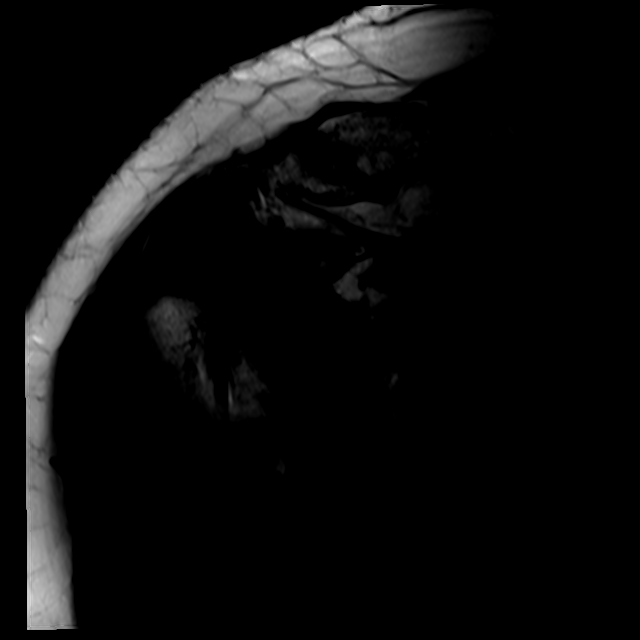
[im 21/21]
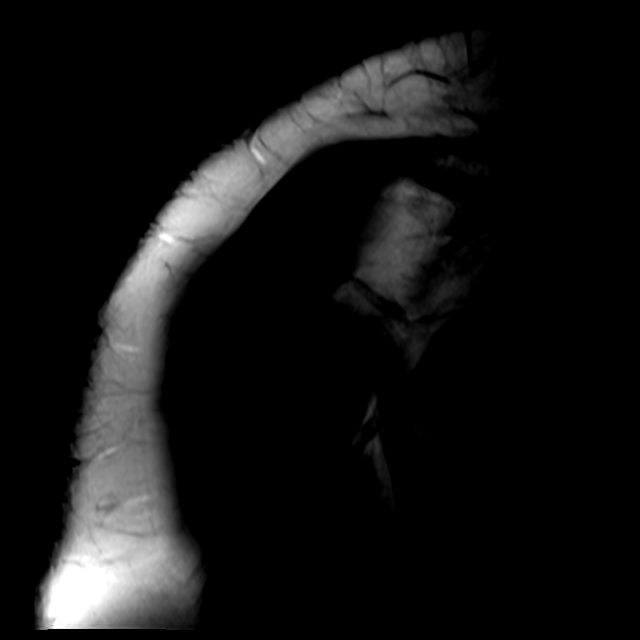

[Series 9: T2 fat-sat · oblique · right · 4.0mm · 0.44mm/px · 3 of 23 slices shown (3 of 3)]
[im 4/23]
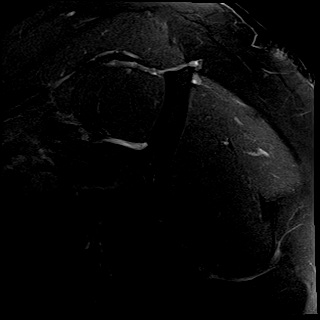
[im 13/23]
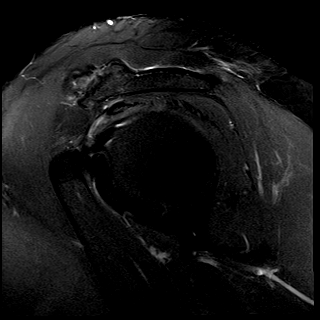
[im 19/23]
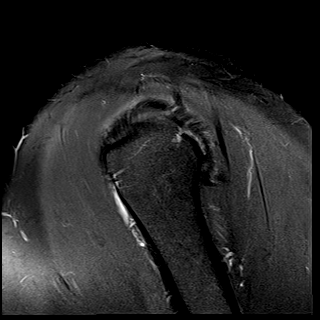

[20 of 40 positions shown; findings below may reference images not displayed]

FINDINGS: Rotator cuff: Intermediate intrasubstance signal of the distal
supraspinatus tendon consistent with tendinopathy with
partial-thickness articular surface (series 7 image 12).
Tendinopathy of the infraspinatus tendon with intrasubstance and
partial-thickness articular surface tear (series 7, image 10-11).
Teres minor tendon is intact. Tendinopathy of the subscapularis with
with low-grade partial-thickness insertional tear.

Muscles: No muscle atrophy or edema. No intramuscular fluid
collection or hematoma.

Biceps Long Head: Intraarticular and extraarticular portions of the
biceps tendon are intact.

Acromioclavicular Joint: Moderate arthropathy of the
acromioclavicular joint characterized by joint capsular thickening
and subacromial osteophytes. Trace amount of fluid in the
subacromial subdeltoid bursa.

Glenohumeral Joint: No joint effusion. Generalized articular
cartilage thinning without full-thickness defect.

Labrum: Degenerative changes without tear, but evaluation is limited
by lack of intraarticular fluid/contrast.

Bones: No fracture or dislocation. No aggressive osseous lesion.

Other: No fluid collection or hematoma.
IMPRESSION: 1. Tendinopathy and partial-thickness articular surface tear of the
supraspinatus and infraspinatus tendons. Trace amount of fluid in
the sub deltoid bursa, which may be secondary to pinhole
full-thickness rotator cuff tear.

2. Tendinopathy and low-grade partial-thickness insertional tear of
the subscapularis

3.  Moderate acromioclavicular osteoarthritis.

4.  Mild glenohumeral osteoarthritis.

5. No evidence of fracture or dislocation. Intraosseous cystic
changes of the greater tuberosity about the insertion of the
infraspinatus. Marrow signal is otherwise within normal limits.

## 2022-10-26 ENCOUNTER — Ambulatory Visit (INDEPENDENT_AMBULATORY_CARE_PROVIDER_SITE_OTHER): Payer: BC Managed Care – PPO | Admitting: Physician Assistant

## 2022-11-23 ENCOUNTER — Other Ambulatory Visit (INDEPENDENT_AMBULATORY_CARE_PROVIDER_SITE_OTHER): Payer: Self-pay | Admitting: Adult Health

## 2022-11-23 DIAGNOSIS — E7849 Other hyperlipidemia: Secondary | ICD-10-CM

## 2022-11-23 DIAGNOSIS — I10 Essential (primary) hypertension: Secondary | ICD-10-CM

## 2022-11-30 ENCOUNTER — Encounter (INDEPENDENT_AMBULATORY_CARE_PROVIDER_SITE_OTHER): Payer: Self-pay | Admitting: Adult Health

## 2022-11-30 ENCOUNTER — Ambulatory Visit (INDEPENDENT_AMBULATORY_CARE_PROVIDER_SITE_OTHER): Payer: BC Managed Care – PPO | Admitting: Adult Health

## 2022-11-30 ENCOUNTER — Encounter: Payer: BC Managed Care – PPO | Admitting: Physician Assistant

## 2022-11-30 VITALS — BP 119/84 | HR 63 | Temp 98.5°F | Ht 73.0 in | Wt 285.0 lb

## 2022-11-30 DIAGNOSIS — E7849 Other hyperlipidemia: Secondary | ICD-10-CM | POA: Diagnosis not present

## 2022-11-30 DIAGNOSIS — Z6837 Body mass index (BMI) 37.0-37.9, adult: Secondary | ICD-10-CM

## 2022-11-30 DIAGNOSIS — E669 Obesity, unspecified: Secondary | ICD-10-CM | POA: Diagnosis not present

## 2022-11-30 DIAGNOSIS — I10 Essential (primary) hypertension: Secondary | ICD-10-CM | POA: Diagnosis not present

## 2022-11-30 MED ORDER — ROSUVASTATIN CALCIUM 10 MG PO TABS: 10.0000 mg | ORAL_TABLET | Freq: Every day | ORAL | 0 refills | Status: DC

## 2022-11-30 MED ORDER — LOSARTAN POTASSIUM 50 MG PO TABS: 50.0000 mg | ORAL_TABLET | Freq: Every day | ORAL | 0 refills | Status: DC

## 2022-11-30 MED ORDER — SAXENDA 18 MG/3ML ~~LOC~~ SOPN: PEN_INJECTOR | SUBCUTANEOUS | 0 refills | Status: DC

## 2022-12-07 NOTE — Progress Notes (Signed)
Chief Complaint:   OBESITY Edward Mccormick is here to discuss his progress with his obesity treatment plan along with follow-up of his obesity related diagnoses. Edward Mccormick is on practicing portion control and making smarter food choices, such as increasing vegetables and decreasing simple carbohydrates and states he is following his eating plan approximately 50% of the time. Edward Mccormick states he is walking 30 minutes 3 times per week.  Today's visit was #: 41 Starting weight: 65 LBS Starting date: 11/26/2019 Today's weight: 285 LBS Today's date: 11/30/2022 Total lbs lost to date: 10 LBS Total lbs lost since last in-office visit: +12 LBS  Interim History:  His last in OV at [place]- 08/31/2022  He has been off GLP-1 therapy/ Saxenda therapy- since 08/17/2022.  Reviewed bioimpedance results with patient: Muscle Mass + 4 lbs Adipose Mass + 7.8 lbs  Subjective:   1. Essential hypertension Blood pressure at goal. He is currently on Losartan '50mg'$  QD. He denies tobacco/vape use.  2. Other hyperlipidemia Lipid Panel     Component Value Date/Time   CHOL 152 08/31/2022 1050   TRIG 85 08/31/2022 1050   HDL 61 08/31/2022 1050   CHOLHDL 2.5 08/31/2022 1050   CHOLHDL 4 09/11/2019 1001   VLDL 16.0 09/11/2019 1001   LDLCALC 75 08/31/2022 1050   LDLDIRECT 145.5 10/14/2011 0822   LABVLDL 16 08/31/2022 1050   He is currently on Rosuvastatin '10mg'$ : Mon/Wed/Fri.  He denies acute cardiac sx's.   Assessment/Plan:   1. Essential hypertension Refill- losartan (COZAAR) 50 MG tablet; Take 1 tablet (50 mg total) by mouth daily.  Dispense: 90 tablet; Refill: 0  2. Other hyperlipidemia Refill- rosuvastatin (CRESTOR) 10 MG tablet; Take 1 tablet (10 mg total) by mouth daily. (M-W-F)  Dispense: 90 tablet; Refill: 0  3. Obesity, current BMI 37.7 Edward Mccormick is currently in the action stage of change. As such, his goal is to continue with weight loss efforts. He has agreed to practicing portion control and making smarter  food choices, such as increasing vegetables and decreasing simple carbohydrates.   Exercise goals:  As is.  Behavioral modification strategies: increasing lean protein intake, decreasing simple carbohydrates, meal planning and cooking strategies, keeping healthy foods in the home, and planning for success.  Edward Mccormick has agreed to follow-up with our clinic in 4 weeks. He was informed of the importance of frequent follow-up visits to maximize his success with intensive lifestyle modifications for his multiple health conditions.   Objective:   Blood pressure 119/84, pulse 63, temperature 98.5 F (36.9 C), height '6\' 1"'$  (1.854 m), weight 285 lb (129.3 kg), SpO2 100 %. Body mass index is 37.6 kg/m.  General: Cooperative, alert, well developed, in no acute distress. HEENT: Conjunctivae and lids unremarkable. Cardiovascular: Regular rhythm.  Lungs: Normal work of breathing. Neurologic: No focal deficits.   Lab Results  Component Value Date   CREATININE 1.06 08/31/2022   BUN 11 08/31/2022   NA 143 08/31/2022   K 4.4 08/31/2022   CL 104 08/31/2022   CO2 24 08/31/2022   Lab Results  Component Value Date   ALT 43 08/31/2022   AST 28 08/31/2022   ALKPHOS 52 08/31/2022   BILITOT 0.4 08/31/2022   Lab Results  Component Value Date   HGBA1C 5.6 08/31/2022   HGBA1C 5.9 (H) 03/16/2022   HGBA1C 5.8 (H) 11/24/2021   HGBA1C 5.6 04/28/2021   HGBA1C 5.8 (H) 01/20/2021   Lab Results  Component Value Date   INSULIN 15.7 08/31/2022   INSULIN 15.7  03/16/2022   INSULIN 8.8 04/28/2021   INSULIN 8.7 01/20/2021   INSULIN 10.3 10/21/2020   Lab Results  Component Value Date   TSH 2.690 11/24/2021   Lab Results  Component Value Date   CHOL 152 08/31/2022   HDL 61 08/31/2022   LDLCALC 75 08/31/2022   LDLDIRECT 145.5 10/14/2011   TRIG 85 08/31/2022   CHOLHDL 2.5 08/31/2022   Lab Results  Component Value Date   VD25OH 49.3 08/31/2022   VD25OH 55.9 03/16/2022   VD25OH 48.4 04/28/2021    Lab Results  Component Value Date   WBC 5.4 11/24/2021   HGB 12.8 (L) 11/24/2021   HCT 37.0 (L) 11/24/2021   MCV 90 11/24/2021   PLT 168 11/24/2021   No results found for: "IRON", "TIBC", "FERRITIN"  Attestation Statements:   Reviewed by clinician on day of visit: allergies, medications, problem list, medical history, surgical history, family history, social history, and previous encounter notes.  I, Davy Pique, RMA, am acting as Location manager for Mina Marble, NP.  I have reviewed the above documentation for accuracy and completeness, and I agree with the above. -  Draylen Lobue d. Konstantinos Cordoba, NP-C

## 2023-01-04 ENCOUNTER — Ambulatory Visit (INDEPENDENT_AMBULATORY_CARE_PROVIDER_SITE_OTHER): Payer: BC Managed Care – PPO | Admitting: Adult Health

## 2023-01-05 ENCOUNTER — Encounter (INDEPENDENT_AMBULATORY_CARE_PROVIDER_SITE_OTHER): Payer: Self-pay | Admitting: Adult Health

## 2023-01-05 ENCOUNTER — Ambulatory Visit (INDEPENDENT_AMBULATORY_CARE_PROVIDER_SITE_OTHER): Payer: BC Managed Care – PPO | Admitting: Adult Health

## 2023-01-05 VITALS — BP 130/88 | HR 62 | Temp 98.3°F | Ht 73.0 in | Wt 283.0 lb

## 2023-01-05 DIAGNOSIS — E7849 Other hyperlipidemia: Secondary | ICD-10-CM

## 2023-01-05 DIAGNOSIS — E669 Obesity, unspecified: Secondary | ICD-10-CM

## 2023-01-05 DIAGNOSIS — Z6837 Body mass index (BMI) 37.0-37.9, adult: Secondary | ICD-10-CM

## 2023-01-05 MED ORDER — ROSUVASTATIN CALCIUM 10 MG PO TABS: 10.0000 mg | ORAL_TABLET | Freq: Every day | ORAL | 0 refills | Status: DC

## 2023-01-05 MED ORDER — SAXENDA 18 MG/3ML ~~LOC~~ SOPN: PEN_INJECTOR | SUBCUTANEOUS | 0 refills | Status: DC

## 2023-01-05 NOTE — Progress Notes (Signed)
Chief Complaint:   OBESITY Edward Mccormick is here to discuss his progress with his obesity treatment plan along with follow-up of his obesity related diagnoses. Edward Mccormick is on practicing portion control and making smarter food choices, such as increasing vegetables and decreasing simple carbohydrates and states he is following his eating plan approximately 80% of the time.  Edward Mccormick states he is walking/hiking 5 miles-2 times per week.  Today's visit was #: 69 Starting weight: 295 lbs Starting date: 11/26/2019 Today's weight: 283 lbs Today's date: 01/05/2023 Total lbs lost to date: 12 lbs Total lbs lost since last in-office visit: - 2 lbs  Interim History:  Walking/hiking during hunting trip with the guys. He states "Only thing I killed was time".  He had to move his usual Wednesday appt today/Thursday due schedule conflict.  He is fasting today, however unable to complete labs due to [facility] closed during lunch- will complete at next OV.  He has been unable to start [place] Shortage (NDS) He denies family hx of MEN2 or MTC.  He denies personal hx of pancreatitis.  Subjective:   1. Other hyperlipidemia Lipid Panel     Component Value Date/Time   CHOL 152 08/31/2022 1050   TRIG 85 08/31/2022 1050   HDL 61 08/31/2022 1050   CHOLHDL 2.5 08/31/2022 1050   CHOLHDL 4 09/11/2019 1001   VLDL 16.0 09/11/2019 1001   LDLCALC 75 08/31/2022 1050   LDLDIRECT 145.5 10/14/2011 0822   LABVLDL 16 08/31/2022 1050   He is compliant with Crestor 34m Mon/Wed/Fri He denies myalgias   Assessment/Plan:   1. Other hyperlipidemia Refill - rosuvastatin (CRESTOR) 10 MG tablet; Take 1 tablet (10 mg total) by mouth daily. (M-W-F)  Dispense: 90 tablet; Refill: 0  2. Obesity, current BMI 37.4 Start Saxenda 0.643mQD for two weeks,then increase 1.19m45mD- hold at this dose. Disp 3ml35m 0  JameXacharycurrently in the action stage of change. As such, his goal is to continue with weight  loss efforts. He has agreed to practicing portion control and making smarter food choices, such as increasing vegetables and decreasing simple carbohydrates.   Exercise goals: For substantial health benefits, adults should do at least 150 minutes (2 hours and 30 minutes) a week of moderate-intensity, or 75 minutes (1 hour and 15 minutes) a week of vigorous-intensity aerobic physical activity, or an equivalent combination of moderate- and vigorous-intensity aerobic activity. Aerobic activity should be performed in episodes of at least 10 minutes, and preferably, it should be spread throughout the week.  Behavioral modification strategies: increasing lean protein intake, decreasing simple carbohydrates, increasing vegetables, increasing water intake, meal planning and cooking strategies, and planning for success.  Edward Mccormick agreed to follow-up with our clinic in 4 weeks. He was informed of the importance of frequent follow-up visits to maximize his success with intensive lifestyle modifications for his multiple health conditions.   FASTING LABS AT NEXT OV  Objective:   Blood pressure 130/88, pulse 62, temperature 98.3 F (36.8 C), height 6' 1"$  (1.854 m), weight 283 lb (128.4 kg), SpO2 100 %. Body mass index is 37.34 kg/m.  General: Cooperative, alert, well developed, in no acute distress. HEENT: Conjunctivae and lids unremarkable. Cardiovascular: Regular rhythm.  Lungs: Normal work of breathing. Neurologic: No focal deficits.   Lab Results  Component Value Date   CREATININE 1.06 08/31/2022   BUN 11 08/31/2022   NA 143 08/31/2022   K 4.4 08/31/2022   CL 104 08/31/2022  CO2 24 08/31/2022   Lab Results  Component Value Date   ALT 43 08/31/2022   AST 28 08/31/2022   ALKPHOS 52 08/31/2022   BILITOT 0.4 08/31/2022   Lab Results  Component Value Date   HGBA1C 5.6 08/31/2022   HGBA1C 5.9 (H) 03/16/2022   HGBA1C 5.8 (H) 11/24/2021   HGBA1C 5.6 04/28/2021   HGBA1C 5.8 (H) 01/20/2021    Lab Results  Component Value Date   INSULIN 15.7 08/31/2022   INSULIN 15.7 03/16/2022   INSULIN 8.8 04/28/2021   INSULIN 8.7 01/20/2021   INSULIN 10.3 10/21/2020   Lab Results  Component Value Date   TSH 2.690 11/24/2021   Lab Results  Component Value Date   CHOL 152 08/31/2022   HDL 61 08/31/2022   LDLCALC 75 08/31/2022   LDLDIRECT 145.5 10/14/2011   TRIG 85 08/31/2022   CHOLHDL 2.5 08/31/2022   Lab Results  Component Value Date   VD25OH 49.3 08/31/2022   VD25OH 55.9 03/16/2022   VD25OH 48.4 04/28/2021   Lab Results  Component Value Date   WBC 5.4 11/24/2021   HGB 12.8 (L) 11/24/2021   HCT 37.0 (L) 11/24/2021   MCV 90 11/24/2021   PLT 168 11/24/2021   No results found for: "IRON", "TIBC", "FERRITIN"  Attestation Statements:   Reviewed by clinician on day of visit: allergies, medications, problem list, medical history, surgical history, family history, social history, and previous encounter notes.  I have reviewed the above documentation for accuracy and completeness, and I agree with the above. -  Fredrich Cory d. Jade Burright, NP-C

## 2023-02-08 ENCOUNTER — Other Ambulatory Visit (HOSPITAL_COMMUNITY): Payer: Self-pay

## 2023-02-08 ENCOUNTER — Ambulatory Visit (INDEPENDENT_AMBULATORY_CARE_PROVIDER_SITE_OTHER): Payer: BC Managed Care – PPO | Admitting: Adult Health

## 2023-02-08 ENCOUNTER — Encounter (INDEPENDENT_AMBULATORY_CARE_PROVIDER_SITE_OTHER): Payer: Self-pay | Admitting: Adult Health

## 2023-02-08 VITALS — BP 118/83 | HR 81 | Temp 98.2°F | Ht 73.0 in | Wt 278.0 lb

## 2023-02-08 DIAGNOSIS — E785 Hyperlipidemia, unspecified: Secondary | ICD-10-CM

## 2023-02-08 DIAGNOSIS — Z6836 Body mass index (BMI) 36.0-36.9, adult: Secondary | ICD-10-CM

## 2023-02-08 DIAGNOSIS — E559 Vitamin D deficiency, unspecified: Secondary | ICD-10-CM | POA: Diagnosis not present

## 2023-02-08 DIAGNOSIS — E669 Obesity, unspecified: Secondary | ICD-10-CM

## 2023-02-08 DIAGNOSIS — I10 Essential (primary) hypertension: Secondary | ICD-10-CM | POA: Diagnosis not present

## 2023-02-08 DIAGNOSIS — R7303 Prediabetes: Secondary | ICD-10-CM | POA: Diagnosis not present

## 2023-02-08 DIAGNOSIS — E7849 Other hyperlipidemia: Secondary | ICD-10-CM

## 2023-02-08 MED ORDER — SAXENDA 18 MG/3ML ~~LOC~~ SOPN
1.8000 mg | PEN_INJECTOR | Freq: Every day | SUBCUTANEOUS | 0 refills | Status: DC
  Filled 2023-02-08: qty 9, 30d supply, fill #0

## 2023-02-08 MED ORDER — LOSARTAN POTASSIUM 50 MG PO TABS
50.0000 mg | ORAL_TABLET | Freq: Every day | ORAL | 0 refills | Status: DC
  Filled 2023-02-08: qty 90, 90d supply, fill #0

## 2023-02-08 MED ORDER — BD PEN NEEDLE NANO 2ND GEN 32G X 4 MM MISC
1.0000 | Freq: Two times a day (BID) | 0 refills | Status: DC
  Filled 2023-02-08: qty 100, 100d supply, fill #0

## 2023-02-08 NOTE — Progress Notes (Signed)
Leave blank

## 2023-02-08 NOTE — Progress Notes (Signed)
WEIGHT SUMMARY AND BIOMETRICS  Vitals Temp: 98.2 F (36.8 C) BP: 118/83 Pulse Rate: 81 SpO2: 99 %   Anthropometric Measurements Height: 6\' 1"  (1.854 m) Weight: 278 lb (126.1 kg) BMI (Calculated): 36.69 Weight at Last Visit: 283lb Weight Lost Since Last Visit: 5lb Weight Gained Since Last Visit: 0 Starting Weight: 295lb Total Weight Loss (lbs): 17 lb (7.711 kg)   Body Composition  Body Fat %: 33.2 % Fat Mass (lbs): 92.4 lbs Muscle Mass (lbs): 177.2 lbs Total Body Water (lbs): 121.8 lbs Visceral Fat Rating : 20   Other Clinical Data Fasting: yes Labs: no Today's Visit #: 73 Starting Date: 11/26/19    Chief Complaint:   OBESITY Edward Mccormick is here to discuss his progress with his obesity treatment plan. He is on the practicing portion control and making smarter food choices, such as increasing vegetables and decreasing simple carbohydrates and states he is following his eating plan approximately 75 % of the time. He states he is not currently exercising.   Interim History:  01/05/2023: Re-started Saxenda therapy. Two weeks of 0.6mg  QD 10 days on 1.2mg  QD Last 3 days he increased to 1.8mg  QD Denies mass in neck, dysphagia, dyspepsia, persistent hoarseness, abdominal pain, or N/V/C   He reports being able to identify his hunger and satiety signals much better since re-starting GLP-1 therapy.  Reivewed Bioemempedence results with pt: Muscle Mss + 4.6 lbs Adipose Mass - 9.6 lbs  Subjective:   1. Vitamin D deficiency  Latest Reference Range & Units 08/31/22 10:50  Vitamin D, 25-Hydroxy 30.0 - 100.0 ng/mL 49.3    2. Essential hypertension BP at goal at OV. He is currently on daily Losartan 50mg . He denies tobacco/vape use.  3. Prediabetes Lab Results  Component Value Date   HGBA1C 5.6 08/31/2022   HGBA1C 5.9 (H) 03/16/2022   HGBA1C 5.8 (H) 11/24/2021   Restarted Saxenda at last OV- currently on 1.8mg  QD Denies mass in neck, dysphagia, dyspepsia,  persistent hoarseness, abdominal pain, or N/V/C   4. HLD He is on Cresoty 10mg  M-W-F He denies myalgia's He denies CP with exertion. He denies tobacco/vape use  Assessment/Plan:   1. Vitamin D deficiency Check Labs - VITAMIN D 25 Hydroxy (Vit-D Deficiency, Fractures)  2. Essential hypertension Check Labs Refill - losartan (COZAAR) 50 MG tablet; Take 1 tablet (50 mg total) by mouth daily.  Dispense: 90 tablet; Refill: 0 - Comprehensive metabolic panel  3. Prediabetes Check Labs - Hemoglobin A1c - Vitamin B12 - Insulin, random  4. HLD Check Labs -lipid panel  5. Obesity, current BMI 36.69  Refill and increase Saxenda 1.8mg  QD Disp 73ml RF 0  Ansh is currently in the action stage of change. As such, his goal is to continue with weight loss efforts. He has agreed to practicing portion control and making smarter food choices, such as increasing vegetables and decreasing simple carbohydrates.   Exercise goals: All adults should avoid inactivity. Some physical activity is better than none, and adults who participate in any amount of physical activity gain some health benefits. For additional and more extensive health benefits, adults should increase their aerobic physical activity to 300 minutes (5 hours) a week of moderate-intensity, or 150 minutes a week of vigorous-intensity aerobic physical activity, or an equivalent combination of moderate- and vigorous-intensity activity. Additional health benefits are gained by engaging in physical activity beyond this amount.   Behavioral modification strategies: increasing lean protein intake, decreasing simple carbohydrates, increasing vegetables, increasing water intake, no  skipping meals, meal planning and cooking strategies, and planning for success.  Calvon has agreed to follow-up with our clinic in 4 weeks. He was informed of the importance of frequent follow-up visits to maximize his success with intensive lifestyle modifications for  his multiple health conditions.   Jettson was informed we would discuss his lab results at his next visit unless there is a critical issue that needs to be addressed sooner. Jamesjoseph agreed to keep his next visit at the agreed upon time to discuss these results.  Objective:   Blood pressure 118/83, pulse 81, temperature 98.2 F (36.8 C), height 6\' 1"  (1.854 m), weight 278 lb (126.1 kg), SpO2 99 %. Body mass index is 36.68 kg/m.  General: Cooperative, alert, well developed, in no acute distress. HEENT: Conjunctivae and lids unremarkable. Cardiovascular: Regular rhythm.  Lungs: Normal work of breathing. Neurologic: No focal deficits.   Lab Results  Component Value Date   CREATININE 1.06 08/31/2022   BUN 11 08/31/2022   NA 143 08/31/2022   K 4.4 08/31/2022   CL 104 08/31/2022   CO2 24 08/31/2022   Lab Results  Component Value Date   ALT 43 08/31/2022   AST 28 08/31/2022   ALKPHOS 52 08/31/2022   BILITOT 0.4 08/31/2022   Lab Results  Component Value Date   HGBA1C 5.6 08/31/2022   HGBA1C 5.9 (H) 03/16/2022   HGBA1C 5.8 (H) 11/24/2021   HGBA1C 5.6 04/28/2021   HGBA1C 5.8 (H) 01/20/2021   Lab Results  Component Value Date   INSULIN 15.7 08/31/2022   INSULIN 15.7 03/16/2022   INSULIN 8.8 04/28/2021   INSULIN 8.7 01/20/2021   INSULIN 10.3 10/21/2020   Lab Results  Component Value Date   TSH 2.690 11/24/2021   Lab Results  Component Value Date   CHOL 152 08/31/2022   HDL 61 08/31/2022   LDLCALC 75 08/31/2022   LDLDIRECT 145.5 10/14/2011   TRIG 85 08/31/2022   CHOLHDL 2.5 08/31/2022   Lab Results  Component Value Date   VD25OH 49.3 08/31/2022   VD25OH 55.9 03/16/2022   VD25OH 48.4 04/28/2021   Lab Results  Component Value Date   WBC 5.4 11/24/2021   HGB 12.8 (L) 11/24/2021   HCT 37.0 (L) 11/24/2021   MCV 90 11/24/2021   PLT 168 11/24/2021   No results found for: "IRON", "TIBC", "FERRITIN"  Attestation Statements:   Reviewed by clinician on day of visit:  allergies, medications, problem list, medical history, surgical history, family history, social history, and previous encounter notes.  I have reviewed the above documentation for accuracy and completeness, and I agree with the above. -  Enid Maultsby d. Tabitha Riggins, NP-C

## 2023-02-09 LAB — LIPID PANEL
Chol/HDL Ratio: 2.8 ratio (ref 0.0–5.0)
Cholesterol, Total: 172 mg/dL (ref 100–199)
HDL: 62 mg/dL (ref >39)
LDL Chol Calc (NIH): 97 mg/dL (ref 0–99)
Triglycerides: 66 mg/dL (ref 0–149)
VLDL Cholesterol Cal: 13 mg/dL (ref 5–40)

## 2023-02-09 LAB — COMPREHENSIVE METABOLIC PANEL
ALT: 17 IU/L (ref 0–44)
AST: 19 IU/L (ref 0–40)
Albumin/Globulin Ratio: 1.6 (ref 1.2–2.2)
Albumin: 4.1 g/dL (ref 3.8–4.9)
Alkaline Phosphatase: 52 IU/L (ref 44–121)
BUN/Creatinine Ratio: 11 (ref 10–24)
BUN: 14 mg/dL (ref 8–27)
Bilirubin Total: 0.4 mg/dL (ref 0.0–1.2)
CO2: 25 mmol/L (ref 20–29)
Calcium: 9.1 mg/dL (ref 8.6–10.2)
Chloride: 102 mmol/L (ref 96–106)
Creatinine, Ser: 1.29 mg/dL — ABNORMAL HIGH (ref 0.76–1.27)
Globulin, Total: 2.6 g/dL (ref 1.5–4.5)
Glucose: 81 mg/dL (ref 70–99)
Potassium: 4.7 mmol/L (ref 3.5–5.2)
Sodium: 139 mmol/L (ref 134–144)
Total Protein: 6.7 g/dL (ref 6.0–8.5)
eGFR: 63 mL/min/{1.73_m2} (ref >59)

## 2023-02-09 LAB — VITAMIN D 25 HYDROXY (VIT D DEFICIENCY, FRACTURES): Vit D, 25-Hydroxy: 53.4 ng/mL (ref 30.0–100.0)

## 2023-02-09 LAB — VITAMIN B12: Vitamin B-12: 526 pg/mL (ref 232–1245)

## 2023-02-09 LAB — HEMOGLOBIN A1C
Est. average glucose Bld gHb Est-mCnc: 114 mg/dL
Hgb A1c MFr Bld: 5.6 % (ref 4.8–5.6)

## 2023-02-09 LAB — INSULIN, RANDOM: INSULIN: 29.8 u[IU]/mL — ABNORMAL HIGH (ref 2.6–24.9)

## 2023-03-05 ENCOUNTER — Other Ambulatory Visit (INDEPENDENT_AMBULATORY_CARE_PROVIDER_SITE_OTHER): Payer: Self-pay | Admitting: Adult Health

## 2023-03-06 ENCOUNTER — Telehealth (INDEPENDENT_AMBULATORY_CARE_PROVIDER_SITE_OTHER): Payer: Self-pay | Admitting: Adult Health

## 2023-03-06 ENCOUNTER — Other Ambulatory Visit (HOSPITAL_COMMUNITY): Payer: Self-pay

## 2023-03-06 ENCOUNTER — Other Ambulatory Visit (INDEPENDENT_AMBULATORY_CARE_PROVIDER_SITE_OTHER): Payer: Self-pay | Admitting: Adult Health

## 2023-03-06 ENCOUNTER — Encounter (INDEPENDENT_AMBULATORY_CARE_PROVIDER_SITE_OTHER): Payer: Self-pay

## 2023-03-06 DIAGNOSIS — E7849 Other hyperlipidemia: Secondary | ICD-10-CM

## 2023-03-06 NOTE — Telephone Encounter (Signed)
Patient is calling in because he finally found a pharmacy that's has saxenda. Patient wanted to know if [name] and approved the medication. Patient would like a nurse or [name] to give him a call . [name]

## 2023-03-08 ENCOUNTER — Ambulatory Visit (INDEPENDENT_AMBULATORY_CARE_PROVIDER_SITE_OTHER): Payer: BC Managed Care – PPO | Admitting: Adult Health

## 2023-03-08 ENCOUNTER — Encounter (INDEPENDENT_AMBULATORY_CARE_PROVIDER_SITE_OTHER): Payer: Self-pay | Admitting: Adult Health

## 2023-03-08 DIAGNOSIS — Z6836 Body mass index (BMI) 36.0-36.9, adult: Secondary | ICD-10-CM

## 2023-03-08 DIAGNOSIS — E782 Mixed hyperlipidemia: Secondary | ICD-10-CM

## 2023-03-08 DIAGNOSIS — E559 Vitamin D deficiency, unspecified: Secondary | ICD-10-CM

## 2023-03-08 DIAGNOSIS — R7303 Prediabetes: Secondary | ICD-10-CM | POA: Diagnosis not present

## 2023-03-08 DIAGNOSIS — I10 Essential (primary) hypertension: Secondary | ICD-10-CM | POA: Diagnosis not present

## 2023-03-08 DIAGNOSIS — E785 Hyperlipidemia, unspecified: Secondary | ICD-10-CM

## 2023-03-08 DIAGNOSIS — E669 Obesity, unspecified: Secondary | ICD-10-CM

## 2023-03-08 MED ORDER — SAXENDA 18 MG/3ML ~~LOC~~ SOPN: PEN_INJECTOR | SUBCUTANEOUS | 0 refills | Status: DC

## 2023-03-08 NOTE — Progress Notes (Signed)
Edward Mccormick Last Visit: 0 Starting Edward: 295lb Total Edward Loss (lbs): 19 lb (8.618 kg)   Body Composition  Body Fat %: 33.3 % Fat Mass (lbs): 92 lbs Muscle Mass (lbs): 175.4 lbs Total Body Water (lbs): 124.4 lbs Visceral Fat Rating : 20   Other Clinical Data Fasting: Yes Labs: No Today's Visit #: 34 Starting Date: 11/26/19    Chief Complaint:   OBESITY Dent is here to discuss his progress with his obesity treatment plan. He is on the practicing portion control and making smarter food choices, such as increasing vegetables and decreasing simple carbohydrates and states he is following his eating plan approximately 80 % of the time.  He states he is exercising in the form of yard work.   Interim History:  At last OV on 02/08/2023- Saxenda was refilled at 1.8mg - he was unable to obtain due to [employer]. Last dose of Saxenda 1.8 mg on/about 02/15/2023 When on GLP-1 therapy- he denies mass in neck, dysphagia, dyspepsia, persistent hoarseness, abdominal pain, or N/V/C   Of note- Prior authorization approved for Saxenda. Effective:  07/18/2022 - 07/19/2023. Patient sent approval message via mychart.   Subjective:   1. Hyperlipidemia/prediabetes Discussed Labs Lipid Panel     Component Value Date/Time   CHOL 172 02/08/2023 0840   TRIG 66 02/08/2023 0840   HDL 62 02/08/2023 0840   CHOLHDL 2.8 02/08/2023 0840   CHOLHDL 4 09/11/2019 1001   VLDL 16.0 09/11/2019 1001   LDLCALC 97 02/08/2023 0840   LDLDIRECT 145.5 10/14/2011 0822   LABVLDL 13 02/08/2023 0840   He is on Crestor  M/W/F He denies myalgia's  2. Vitamin D deficiency Discussed Labs  Latest Reference  Range & Units 03/16/22 10:51 08/31/22 10:50 02/08/23 08:40  Vitamin D, 25-Hydroxy 30.0 - 100.0 ng/mL 55.9 49.3 53.4  He is on OTC Vit D3- unsure of strength ?  3. Essential hypertension Discussed Labs 02/08/2023 CMP- Electrolyes stable Serum creat increased to 1.29 GFR 63 He is on: losartan (COZAAR) 50 MG tablet   4. Prediabetes Discussed Labs  Latest Reference Range & Units 02/08/23 08:40  Glucose 70 - 99 mg/dL 81  Hemoglobin Z6X 4.8 - 5.6 % 5.6  Est. average glucose Bld gHb Est-mCnc mg/dL 096  INSULIN 2.6 - 04.5 uIU/mL 29.8 (H)  (H): Data is abnormally high A1c stable, IR significantly increased. He has been off Saxenda 1.8mg  Mccormick on/about 02/15/2023  Assessment/Plan:   1. Hyperlipidemia/prediabetes Continue Crestor  M/W/F  2. Vitamin D deficiency Discussed Labs Continue OTC supplementation- please provide strength at next OV  3. Essential hypertension Increase water Avoid Nephrotoxic substances Re-check labs in 6-8 weeks  4. Prediabetes Significantly decrease sugar/CHO Increase protein  5. Obesity, current BMI 36.42  Refill[]   Liraglutide -Edward Management (SAXENDA) 18 MG/3ML SOPN 1.2mg  daily injection for on week then increase to 1.8mg  Dispense: 9 mL, Refills: 0 ordered  Called and spoke with Pharmacy- PA is still in place for Saxenda.  Co-pay has increased from $30 to >$1200  Cassandra is currently in the action stage of change. As such, his goal is to continue with Edward loss efforts. He has agreed to practicing portion control and making smarter food choices,  such as increasing vegetables and decreasing simple carbohydrates.   Exercise goals: All adults should avoid inactivity. Some physical activity is better than none, and adults who participate in any amount of physical activity gain some health benefits. For additional and more extensive health benefits, adults should increase their aerobic physical activity to 300 minutes (5 hours) a week of  moderate-intensity, or 150 minutes a week of vigorous-intensity aerobic physical activity, or an equivalent combination of moderate- and vigorous-intensity activity. Additional health benefits are gained by engaging in physical activity beyond this amount.  Adults should also include muscle-strengthening activities that involve all major muscle groups on 2 or more days a week.  Behavioral modification strategies: increasing lean protein intake, decreasing simple carbohydrates, increasing vegetables, increasing water intake, no skipping meals, meal planning and cooking strategies, and planning for success.  Kalmen has agreed to follow-up with our clinic in 4 weeks. He was informed of the importance of frequent follow-up visits to maximize his success with intensive lifestyle modifications for his multiple health conditions.   Objective:   Blood pressure 121/85, pulse 60, temperature 98.2 F (36.8 C), height  (1.854 m), Edward 276 lb (125.2 kg), SpO2 100 %. Body mass index is 36.41 kg/m.  General: Cooperative, alert, well developed, in no acute distress. HEENT: Conjunctivae and lids unremarkable. Cardiovascular: Regular rhythm.  Lungs: Normal work of breathing. Neurologic: No focal deficits.   Lab Results  Component Value Date   CREATININE 1.29 (H) 02/08/2023   BUN 14 02/08/2023   NA 139 02/08/2023   K 4.7 02/08/2023   CL 102 02/08/2023   CO2 25 02/08/2023   Lab Results  Component Value Date   ALT 17 02/08/2023   AST 19 02/08/2023   ALKPHOS 52 02/08/2023   BILITOT 0.4 02/08/2023   Lab Results  Component Value Date   HGBA1C 5.6 02/08/2023   HGBA1C 5.6 08/31/2022   HGBA1C 5.9 (H) 03/16/2022   HGBA1C 5.8 (H) 11/24/2021   HGBA1C 5.6 04/28/2021   Lab Results  Component Value Date   INSULIN 29.8 (H) 02/08/2023   INSULIN 15.7 08/31/2022   INSULIN 15.7 03/16/2022   INSULIN 8.8 04/28/2021   INSULIN 8.7 01/20/2021   Lab Results  Component Value Date   TSH 2.690 11/24/2021    Lab Results  Component Value Date   CHOL 172 02/08/2023   HDL 62 02/08/2023   LDLCALC 97 02/08/2023   LDLDIRECT 145.5 10/14/2011   TRIG 66 02/08/2023   CHOLHDL 2.8 02/08/2023   Lab Results  Component Value Date   VD25OH 53.4 02/08/2023   VD25OH 49.3 08/31/2022   VD25OH 55.9 03/16/2022   Lab Results  Component Value Date   WBC 5.4 11/24/2021   HGB 12.8 (L) 11/24/2021   HCT 37.0 (L) 11/24/2021   MCV 90 11/24/2021   PLT 168 11/24/2021   No results found for: "IRON", "TIBC", "FERRITIN"  Attestation Statements:   Reviewed by clinician on day of visit: allergies, medications, problem list, medical history, surgical history, family history, social history, and previous encounter notes.  I have reviewed the above documentation for accuracy and completeness, and I agree with the above. -  Babette Stum d. Deaaron Fulghum, NP-C

## 2023-04-01 ENCOUNTER — Other Ambulatory Visit (INDEPENDENT_AMBULATORY_CARE_PROVIDER_SITE_OTHER): Payer: Self-pay | Admitting: Adult Health

## 2023-04-01 DIAGNOSIS — E7849 Other hyperlipidemia: Secondary | ICD-10-CM

## 2023-04-05 ENCOUNTER — Ambulatory Visit (INDEPENDENT_AMBULATORY_CARE_PROVIDER_SITE_OTHER): Payer: BC Managed Care – PPO | Admitting: Adult Health

## 2023-04-05 ENCOUNTER — Encounter (INDEPENDENT_AMBULATORY_CARE_PROVIDER_SITE_OTHER): Payer: Self-pay | Admitting: Adult Health

## 2023-04-05 VITALS — BP 116/77 | HR 72 | Temp 98.6°F | Ht 73.0 in | Wt 277.0 lb

## 2023-04-05 DIAGNOSIS — E559 Vitamin D deficiency, unspecified: Secondary | ICD-10-CM | POA: Diagnosis not present

## 2023-04-05 DIAGNOSIS — R7989 Other specified abnormal findings of blood chemistry: Secondary | ICD-10-CM

## 2023-04-05 DIAGNOSIS — Z6836 Body mass index (BMI) 36.0-36.9, adult: Secondary | ICD-10-CM

## 2023-04-05 DIAGNOSIS — R7303 Prediabetes: Secondary | ICD-10-CM

## 2023-04-05 DIAGNOSIS — I10 Essential (primary) hypertension: Secondary | ICD-10-CM

## 2023-04-05 DIAGNOSIS — E669 Obesity, unspecified: Secondary | ICD-10-CM

## 2023-04-05 NOTE — Progress Notes (Signed)
WEIGHT SUMMARY AND BIOMETRICS  Vitals Temp: 98.6 F (37 C) BP: 116/77 Pulse Rate: 72 SpO2: 96 %   Anthropometric Measurements Height: 6\' 1"  (1.854 m) Weight: 277 lb (125.6 kg) BMI (Calculated): 36.55 Weight at Last Visit: 276lb Weight Lost Since Last Visit: 0 Weight Gained Since Last Visit: 1lb Starting Weight: 295lb Total Weight Loss (lbs): 18 lb (8.165 kg)   Body Composition  Body Fat %: 33.6 % Fat Mass (lbs): 93.2 lbs Muscle Mass (lbs): 175.4 lbs Total Body Water (lbs): 126.2 lbs Visceral Fat Rating : 20   Other Clinical Data Fasting: yes Labs: no Today's Visit #: 35 Starting Date: 11/26/19    Chief Complaint:   OBESITY Edward Mccormick is here to discuss his progress with his obesity treatment plan. He is on the practicing portion control and making smarter food choices, such as increasing vegetables and decreasing simple carbohydrates and states he is following his eating plan approximately 80 % of the time. He states he is not currently exercising.   Interim History:  Edward Mccormick endorses recent stress and lack of time to exercise.  He has been unable to secure refill of Sazenda 1.2mg - been off GLP-1 >6 weeks.  Hunger/appetite-increase in appetite since he has been off GLP-1 therapy the last 6 weeks.  Stress- currently dealing with separate IRS audit situations in last 4 weeks.  He will need to pay out settlements.    Exercise-He is not currently participating in exercise regime.  Hydration-He estimates to drink only 10-15 oz water/day  Subjective:   1. Prediabetes Lab Results  Component Value Date   HGBA1C 5.6 02/08/2023   HGBA1C 5.6 08/31/2022   HGBA1C 5.9 (H) 03/16/2022  He has been unable to secure refill of Sazenda 1.2mg - been off GLP-1 >6 weeks. Edward Mccormick reports an increase in appetite since he has been off GLP-1 therapy the last 6 weeks.  2. Essential hypertension BP at goal at OV. He is currently on daily Losartan 50mg . Home readings  110-120s/ 70-80s He denies tobacco/vape use.  3. Vitamin D deficiency  Latest Reference Range & Units 08/31/22 10:50 02/08/23 08:40  Vitamin D, 25-Hydroxy 30.0 - 100.0 ng/mL 49.3 53.4  He is currently on daily OTC Vit D 3 5,000 IU He reports increase in fatigue the last several weeks  4. Elevated Serum Creatine  Latest Reference Range & Units 11/24/21 11:08 03/16/22 10:51 08/31/22 10:50 02/08/23 08:40  Creatinine 0.76 - 1.27 mg/dL 1.61 0.96 0.45 4.09 (H)  (H): Data is abnormally high  Latest Reference Range & Units 11/24/21 11:08 03/16/22 10:51 08/31/22 10:50 02/08/23 08:40  eGFR >59 mL/min/1.73 80 74 80 63   He is currently on daily Losartan 50mg  He estimates to drink only 10-15 oz water/day  Assessment/Plan:   1. Prediabetes Increase protein, limit sugar/CHO intake Increase cardiovascular exercise  2. Essential hypertension Continue daily ACE therapy and monitor ambulatory BP Limit Na+ intake Increase water intake  3. Vitamin D deficiency Continue OTC supplementation  4. Elevated Serum Creatine Increase water intake- strive for at least 80 oz/day Check CMP  5. Obesity, Starting BMI 38.92  Edward Mccormick is currently in the action stage of change. As such, his goal is to continue with weight loss efforts. He has agreed to practicing portion control and making smarter food choices, such as increasing vegetables and decreasing simple carbohydrates.   Exercise goals: For substantial health benefits, adults should do at least 150 minutes (2 hours and 30 minutes) a week of moderate-intensity, or 75  minutes (1 hour and 15 minutes) a week of vigorous-intensity aerobic physical activity, or an equivalent combination of moderate- and vigorous-intensity aerobic activity. Aerobic activity should be performed in episodes of at least 10 minutes, and preferably, it should be spread throughout the week.  Behavioral modification strategies: increasing lean protein intake, decreasing simple  carbohydrates, increasing vegetables, increasing water intake, no skipping meals, meal planning and cooking strategies, keeping healthy foods in the home, and planning for success.  Edward Mccormick has agreed to follow-up with our clinic in 4 weeks. He was informed of the importance of frequent follow-up visits to maximize his success with intensive lifestyle modifications for his multiple health conditions.   Edward Mccormick was informed we would discuss his lab results at his next visit unless there is a critical issue that needs to be addressed sooner. Edward Mccormick agreed to keep his next visit at the agreed upon time to discuss these results.  Objective:   Blood pressure 116/77, pulse 72, temperature 98.6 F (37 C), height 6\' 1"  (1.854 m), weight 277 lb (125.6 kg), SpO2 96 %. Body mass index is 36.55 kg/m.  General: Cooperative, alert, well developed, in no acute distress. HEENT: Conjunctivae and lids unremarkable. Cardiovascular: Regular rhythm.  Lungs: Normal work of breathing. Neurologic: No focal deficits.   Lab Results  Component Value Date   CREATININE 1.29 (H) 02/08/2023   BUN 14 02/08/2023   NA 139 02/08/2023   K 4.7 02/08/2023   CL 102 02/08/2023   CO2 25 02/08/2023   Lab Results  Component Value Date   ALT 17 02/08/2023   AST 19 02/08/2023   ALKPHOS 52 02/08/2023   BILITOT 0.4 02/08/2023   Lab Results  Component Value Date   HGBA1C 5.6 02/08/2023   HGBA1C 5.6 08/31/2022   HGBA1C 5.9 (H) 03/16/2022   HGBA1C 5.8 (H) 11/24/2021   HGBA1C 5.6 04/28/2021   Lab Results  Component Value Date   INSULIN 29.8 (H) 02/08/2023   INSULIN 15.7 08/31/2022   INSULIN 15.7 03/16/2022   INSULIN 8.8 04/28/2021   INSULIN 8.7 01/20/2021   Lab Results  Component Value Date   TSH 2.690 11/24/2021   Lab Results  Component Value Date   CHOL 172 02/08/2023   HDL 62 02/08/2023   LDLCALC 97 02/08/2023   LDLDIRECT 145.5 10/14/2011   TRIG 66 02/08/2023   CHOLHDL 2.8 02/08/2023   Lab Results   Component Value Date   VD25OH 53.4 02/08/2023   VD25OH 49.3 08/31/2022   VD25OH 55.9 03/16/2022   Lab Results  Component Value Date   WBC 5.4 11/24/2021   HGB 12.8 (L) 11/24/2021   HCT 37.0 (L) 11/24/2021   MCV 90 11/24/2021   PLT 168 11/24/2021   No results found for: "IRON", "TIBC", "FERRITIN"  Attestation Statements:   Reviewed by clinician on day of visit: allergies, medications, problem list, medical history, surgical history, family history, social history, and previous encounter notes.  I have reviewed the above documentation for accuracy and completeness, and I agree with the above. -  Dillon Mcreynolds d. Tessah Patchen, NP-C

## 2023-04-06 ENCOUNTER — Encounter (INDEPENDENT_AMBULATORY_CARE_PROVIDER_SITE_OTHER): Payer: Self-pay | Admitting: Adult Health

## 2023-04-06 LAB — COMPREHENSIVE METABOLIC PANEL
ALT: 32 IU/L (ref 0–44)
AST: 25 IU/L (ref 0–40)
Albumin/Globulin Ratio: 1.6 (ref 1.2–2.2)
Albumin: 3.9 g/dL (ref 3.8–4.9)
Alkaline Phosphatase: 59 IU/L (ref 44–121)
BUN/Creatinine Ratio: 9 — ABNORMAL LOW (ref 10–24)
BUN: 11 mg/dL (ref 8–27)
Bilirubin Total: 0.3 mg/dL (ref 0.0–1.2)
CO2: 25 mmol/L (ref 20–29)
Calcium: 9.1 mg/dL (ref 8.6–10.2)
Chloride: 106 mmol/L (ref 96–106)
Creatinine, Ser: 1.28 mg/dL — ABNORMAL HIGH (ref 0.76–1.27)
Globulin, Total: 2.4 g/dL (ref 1.5–4.5)
Glucose: 92 mg/dL (ref 70–99)
Potassium: 4.5 mmol/L (ref 3.5–5.2)
Sodium: 142 mmol/L (ref 134–144)
Total Protein: 6.3 g/dL (ref 6.0–8.5)
eGFR: 64 mL/min/{1.73_m2} (ref >59)

## 2023-05-03 ENCOUNTER — Ambulatory Visit (INDEPENDENT_AMBULATORY_CARE_PROVIDER_SITE_OTHER): Payer: BC Managed Care – PPO | Admitting: Adult Health

## 2023-06-07 ENCOUNTER — Encounter (INDEPENDENT_AMBULATORY_CARE_PROVIDER_SITE_OTHER): Payer: Self-pay | Admitting: Adult Health

## 2023-06-07 ENCOUNTER — Ambulatory Visit (INDEPENDENT_AMBULATORY_CARE_PROVIDER_SITE_OTHER): Payer: BC Managed Care – PPO | Admitting: Adult Health

## 2023-06-07 VITALS — BP 126/86 | HR 60 | Temp 98.1°F | Ht 73.0 in | Wt 278.0 lb

## 2023-06-07 DIAGNOSIS — Z6836 Body mass index (BMI) 36.0-36.9, adult: Secondary | ICD-10-CM

## 2023-06-07 DIAGNOSIS — E669 Obesity, unspecified: Secondary | ICD-10-CM

## 2023-06-07 DIAGNOSIS — I10 Essential (primary) hypertension: Secondary | ICD-10-CM | POA: Diagnosis not present

## 2023-06-07 DIAGNOSIS — R7989 Other specified abnormal findings of blood chemistry: Secondary | ICD-10-CM

## 2023-06-07 DIAGNOSIS — E7849 Other hyperlipidemia: Secondary | ICD-10-CM | POA: Diagnosis not present

## 2023-06-07 MED ORDER — ROSUVASTATIN CALCIUM 10 MG PO TABS
10.0000 mg | ORAL_TABLET | Freq: Every day | ORAL | 0 refills | Status: DC
Start: 2023-06-07 — End: 2023-12-06

## 2023-06-07 MED ORDER — LOSARTAN POTASSIUM 50 MG PO TABS
50.0000 mg | ORAL_TABLET | Freq: Every day | ORAL | 0 refills | Status: DC
Start: 2023-06-07 — End: 2023-08-09

## 2023-06-07 NOTE — Progress Notes (Signed)
WEIGHT SUMMARY AND BIOMETRICS  Vitals Temp: 98.1 F (36.7 C) BP: 126/86 Pulse Rate: 60 SpO2: 100 %   Anthropometric Measurements Height: 6\' 1"  (1.854 m) Weight: 278 lb (126.1 kg) BMI (Calculated): 36.69 Weight at Last Visit: 277lb Weight Lost Since Last Visit: 0 Weight Gained Since Last Visit: 1lb Starting Weight: 295lb Total Weight Loss (lbs): 17 lb (7.711 kg)   Body Composition  Body Fat %: 33.4 % Fat Mass (lbs): 93.2 lbs Muscle Mass (lbs): 176.6 lbs Total Body Water (lbs): 124 lbs Visceral Fat Rating : 20   Other Clinical Data Fasting: Yes Labs: no Today's Visit #: 36 Starting Date: 11/26/19    Chief Complaint:   OBESITY Edward Mccormick is here to discuss his progress with his obesity treatment plan. He is on the practicing portion control and making smarter food choices, such as increasing vegetables and decreasing simple carbohydrates and states he is following his eating plan approximately 70 % of the time. He states he is exercising Walking/Yard Work 30 minutes 1 times per week.   Interim History:  On 04/12/2023- He established with new PCP- [facility]/Dr. Lewie Chamber PCP aware that his most recent GFR was 64  Hydration-Mr. Esterline has increased water intake to 80-100 oz plain water/day  Subjective:   1. Other hyperlipidemia The 10-year ASCVD risk score (Arnett DK, et al., 2019) is: 12.2%   Values used to calculate the score:     Age: 28 years     Sex: Male     Is Non-Hispanic African American: Yes     Diabetic: No     Tobacco smoker: No     Systolic Blood Pressure: 126 mmHg     Is BP treated: Yes     HDL Cholesterol: 62 mg/dL     Total Cholesterol: 172 mg/dL  Edward Mccormick is on Crestor 10mg  three times weekly- tolerating well  2. Elevated serum creatinine  Latest Reference Range & Units 08/31/22 10:50 02/08/23 08:40 04/05/23 09:49  Creatinine 0.76 - 1.27 mg/dL 1.19 1.47 (H) 8.29 (H)  (H): Data is abnormally high  Latest Reference Range & Units  08/31/22 10:50 02/08/23 08:40 04/05/23 09:49  eGFR >59 mL/min/1.73 80 63 64   He is on daily Losartan 50mg  He established with new PCP- [facility]/Dr. Lewie Chamber  3. Essential hypertension BP at goal at OV Home readings SBP 120-130s DBP 70-80s He is currently on daily Losartan 50mg  He denies CP with exertion. He denies tobacco/vape use.  Assessment/Plan:   1. Other hyperlipidemia Refill - rosuvastatin (CRESTOR) 10 MG tablet; Take 1 tablet (10 mg total) by mouth daily. (M-W-F)  Dispense: 90 tablet; Refill: 0  2. Elevated serum creatinine Continue excellent hydration and avoiding Nephrotoxic substances  3. Essential hypertension Refill - losartan (COZAAR) 50 MG tablet; Take 1 tablet (50 mg total) by mouth daily.  Dispense: 90 tablet; Refill: 0  4. Obesity, current BMI 36.8  [name] is currently in the action stage of change. As such, his goal is to continue with weight loss efforts. He has agreed to practicing portion control and making smarter food choices, such as increasing vegetables and decreasing simple carbohydrates.   Exercise goals: For substantial health benefits, adults should do at least 150 minutes (2 hours and 30 minutes) a week of moderate-intensity, or 75 minutes (1 hour and 15 minutes) a week of vigorous-intensity aerobic physical activity, or an equivalent combination of moderate- and vigorous-intensity aerobic activity. Aerobic activity should be performed in episodes of at  least 10 minutes, and preferably, it should be spread throughout the week.  Behavioral modification strategies: increasing lean protein intake, decreasing simple carbohydrates, increasing vegetables, increasing water intake, decreasing sodium intake, no skipping meals, meal planning and cooking strategies, better snacking choices, and planning for success.  [name] has agreed to follow-up with our clinic in 4 weeks. He was informed of the importance of frequent follow-up visits to maximize his  success with intensive lifestyle modifications for his multiple health conditions.   Objective:   Blood pressure 126/86, pulse 60, temperature 98.1 F (36.7 C), height 6\' 1"  (1.854 m), weight 278 lb (126.1 kg), SpO2 100%. Body mass index is 36.68 kg/m.  General: Cooperative, alert, well developed, in no acute distress. HEENT: Conjunctivae and lids unremarkable. Cardiovascular: Regular rhythm.  Lungs: Normal work of breathing. Neurologic: No focal deficits.   Lab Results  Component Value Date   CREATININE 1.28 (H) 04/05/2023   BUN 11 04/05/2023   NA 142 04/05/2023   K 4.5 04/05/2023   CL 106 04/05/2023   CO2 25 04/05/2023   Lab Results  Component Value Date   ALT 32 04/05/2023   AST 25 04/05/2023   ALKPHOS 59 04/05/2023   BILITOT 0.3 04/05/2023   Lab Results  Component Value Date   HGBA1C 5.6 02/08/2023   HGBA1C 5.6 08/31/2022   HGBA1C 5.9 (H) 03/16/2022   HGBA1C 5.8 (H) 11/24/2021   HGBA1C 5.6 04/28/2021   Lab Results  Component Value Date   INSULIN 29.8 (H) 02/08/2023   INSULIN 15.7 08/31/2022   INSULIN 15.7 03/16/2022   INSULIN 8.8 04/28/2021   INSULIN 8.7 01/20/2021   Lab Results  Component Value Date   TSH 2.690 11/24/2021   Lab Results  Component Value Date   CHOL 172 02/08/2023   HDL 62 02/08/2023   LDLCALC 97 02/08/2023   LDLDIRECT 145.5 10/14/2011   TRIG 66 02/08/2023   CHOLHDL 2.8 02/08/2023   Lab Results  Component Value Date   VD25OH 53.4 02/08/2023   VD25OH 49.3 08/31/2022   VD25OH 55.9 03/16/2022   Lab Results  Component Value Date   WBC 5.4 11/24/2021   HGB 12.8 (L) 11/24/2021   HCT 37.0 (L) 11/24/2021   MCV 90 11/24/2021   PLT 168 11/24/2021   No results found for: "IRON", "TIBC", "FERRITIN"  Attestation Statements:   Reviewed by clinician on day of visit: allergies, medications, problem list, medical history, surgical history, family history, social history, and previous encounter notes.  I have reviewed the above  documentation for accuracy and completeness, and I agree with the above. -  Natasia Sanko d. Phu Record, NP-C

## 2023-07-12 ENCOUNTER — Ambulatory Visit (INDEPENDENT_AMBULATORY_CARE_PROVIDER_SITE_OTHER): Payer: BC Managed Care – PPO | Admitting: Adult Health

## 2023-08-09 ENCOUNTER — Encounter (INDEPENDENT_AMBULATORY_CARE_PROVIDER_SITE_OTHER): Payer: Self-pay | Admitting: Adult Health

## 2023-08-09 ENCOUNTER — Ambulatory Visit (INDEPENDENT_AMBULATORY_CARE_PROVIDER_SITE_OTHER): Payer: BC Managed Care – PPO | Admitting: Adult Health

## 2023-08-09 VITALS — BP 115/78 | HR 62 | Temp 98.4°F | Ht 73.0 in | Wt 280.0 lb

## 2023-08-09 DIAGNOSIS — Z6836 Body mass index (BMI) 36.0-36.9, adult: Secondary | ICD-10-CM

## 2023-08-09 DIAGNOSIS — E559 Vitamin D deficiency, unspecified: Secondary | ICD-10-CM | POA: Diagnosis not present

## 2023-08-09 DIAGNOSIS — E669 Obesity, unspecified: Secondary | ICD-10-CM

## 2023-08-09 DIAGNOSIS — I10 Essential (primary) hypertension: Secondary | ICD-10-CM | POA: Diagnosis not present

## 2023-08-09 DIAGNOSIS — E7849 Other hyperlipidemia: Secondary | ICD-10-CM

## 2023-08-09 MED ORDER — LOSARTAN POTASSIUM 50 MG PO TABS
50.0000 mg | ORAL_TABLET | Freq: Every day | ORAL | 0 refills | Status: DC
Start: 2023-08-09 — End: 2023-09-13

## 2023-08-09 NOTE — Progress Notes (Signed)
WEIGHT SUMMARY AND BIOMETRICS  Vitals Temp: 98.4 F (36.9 C) BP: 115/78 Pulse Rate: 62 SpO2: 95 %   Anthropometric Measurements Height: 6\' 1"  (1.854 m) Weight: 280 lb (127 kg) BMI (Calculated): 36.95 Weight at Last Visit: 278lb Weight Lost Since Last Visit: 0 Weight Gained Since Last Visit: 2lb Starting Weight: 295lb Total Weight Loss (lbs): 15 lb (6.804 kg)   Body Composition  Body Fat %: 33.3 % Fat Mass (lbs): 93.4 lbs Muscle Mass (lbs): 178 lbs Total Body Water (lbs): 122.2 lbs Visceral Fat Rating : 20   Other Clinical Data Fasting: yes Labs: no Today's Visit #: 37 Starting Date: 11/26/19    Chief Complaint:   OBESITY Edward Mccormick is here to discuss his progress with his obesity treatment plan. He is on the practicing portion control and making smarter food choices, such as increasing vegetables and decreasing simple carbohydrates and states he is following his eating plan approximately 60 % of the time. He states he is exercising House/Yard work 15 minutes 2 times per week.   Interim History:  Edward Mccormick and his wife will eat out 3-4 times per week. They will frequent the following establishments: [employer]  Reviewed [employer] results with pt: Muscle Mass: +1.4 Adipose Mass: +0.2   Subjective:   1. Essential hypertension BP at goal at OV He denies CP with exertion.  He continues to experience lingering respiratory sx's (congestion, cough)  2. Other hyperlipidemia Lipid Panel     Component Value Date/Time   CHOL 172 02/08/2023 0840   TRIG 66 02/08/2023 0840   HDL 62 02/08/2023 0840   CHOLHDL 2.8 02/08/2023 0840   CHOLHDL 4 09/11/2019 1001   VLDL 16.0 09/11/2019 1001   LDLCALC 97 02/08/2023 0840   LDLDIRECT 145.5 10/14/2011 0822   LABVLDL 13 02/08/2023 0840   The 10-year ASCVD risk score (Arnett DK, et al., 2019) is: 10.3%   Values used to calculate the score:     Age: 62 years     Sex: Male     Is  Non-Hispanic African American: Yes     Diabetic: No     Tobacco smoker: No     Systolic Blood Pressure: 115 mmHg     Is BP treated: Yes     HDL Cholesterol: 62 mg/dL     Total Cholesterol: 172 mg/dL   He is currently on Crestor 10mg  M/W/F each week He denies myalgias  3. Vitamin D deficiency He is on OTC Vit D 3- he believes that it is 5,000 international units  He reports stable energy levels  Assessment/Plan:   1. Essential hypertension Refill - losartan (COZAAR) 50 MG tablet; Take 1 tablet (50 mg total) by mouth daily.  Dispense: 90 tablet; Refill: 0  Have PCP take over in Nov 2024  2. Other hyperlipidemia Continue Crestor 10mg  three times weekly  Have PCP take over in Nov 2024  3. Vitamin D deficiency Confirm Vit D strength at next OV  4. Obesity, current BMI 36.8  Edward Mccormick is currently in the action stage of change. As such, his goal is to continue with weight loss efforts. He has agreed to practicing portion control and making smarter food choices, such as increasing vegetables and decreasing simple carbohydrates.   Exercise goals: For substantial health benefits, adults should do at least 150 minutes (2 hours and 30 minutes) a week of moderate-intensity, or 75 minutes (1 hour and 15 minutes) a week of vigorous-intensity aerobic physical activity,  or an equivalent combination of moderate- and vigorous-intensity aerobic activity. Aerobic activity should be performed in episodes of at least 10 minutes, and preferably, it should be spread throughout the week.  Behavioral modification strategies: increasing lean protein intake, decreasing simple carbohydrates, increasing vegetables, increasing water intake, decreasing eating out, meal planning and cooking strategies, and planning for success.  Edward Mccormick has agreed to follow-up with our clinic in 4 weeks. He was informed of the importance of frequent follow-up visits to maximize his success with intensive lifestyle modifications for  his multiple health conditions.   Check FASTING LABS at next OV  Objective:   Blood pressure 115/78, pulse 62, temperature 98.4 F (36.9 C), height 6\' 1"  (1.854 m), weight 280 lb (127 kg), SpO2 95%. Body mass index is 36.94 kg/m.  General: Cooperative, alert, well developed, in no acute distress. HEENT: Conjunctivae and lids unremarkable. Cardiovascular: Regular rhythm.  Lungs: Normal work of breathing. Neurologic: No focal deficits.   Lab Results  Component Value Date   CREATININE 1.28 (H) 04/05/2023   BUN 11 04/05/2023   NA 142 04/05/2023   K 4.5 04/05/2023   CL 106 04/05/2023   CO2 25 04/05/2023   Lab Results  Component Value Date   ALT 32 04/05/2023   AST 25 04/05/2023   ALKPHOS 59 04/05/2023   BILITOT 0.3 04/05/2023   Lab Results  Component Value Date   HGBA1C 5.6 02/08/2023   HGBA1C 5.6 08/31/2022   HGBA1C 5.9 (H) 03/16/2022   HGBA1C 5.8 (H) 11/24/2021   HGBA1C 5.6 04/28/2021   Lab Results  Component Value Date   INSULIN 29.8 (H) 02/08/2023   INSULIN 15.7 08/31/2022   INSULIN 15.7 03/16/2022   INSULIN 8.8 04/28/2021   INSULIN 8.7 01/20/2021   Lab Results  Component Value Date   TSH 2.690 11/24/2021   Lab Results  Component Value Date   CHOL 172 02/08/2023   HDL 62 02/08/2023   LDLCALC 97 02/08/2023   LDLDIRECT 145.5 10/14/2011   TRIG 66 02/08/2023   CHOLHDL 2.8 02/08/2023   Lab Results  Component Value Date   VD25OH 53.4 02/08/2023   VD25OH 49.3 08/31/2022   VD25OH 55.9 03/16/2022   Lab Results  Component Value Date   WBC 5.4 11/24/2021   HGB 12.8 (L) 11/24/2021   HCT 37.0 (L) 11/24/2021   MCV 90 11/24/2021   PLT 168 11/24/2021   No results found for: "IRON", "TIBC", "FERRITIN"  Attestation Statements:   Reviewed by clinician on day of visit: allergies, medications, problem list, medical history, surgical history, family history, social history, and previous encounter notes.  I have reviewed the above documentation for accuracy  and completeness, and I agree with the above. -  Aitan Rossbach d. Winefred Hillesheim, NP-C

## 2023-09-13 ENCOUNTER — Encounter (INDEPENDENT_AMBULATORY_CARE_PROVIDER_SITE_OTHER): Payer: Self-pay | Admitting: Adult Health

## 2023-09-13 ENCOUNTER — Ambulatory Visit (INDEPENDENT_AMBULATORY_CARE_PROVIDER_SITE_OTHER): Payer: BC Managed Care – PPO | Admitting: Adult Health

## 2023-09-13 VITALS — BP 124/84 | HR 63 | Temp 98.4°F | Ht 73.0 in | Wt 279.0 lb

## 2023-09-13 DIAGNOSIS — R7989 Other specified abnormal findings of blood chemistry: Secondary | ICD-10-CM | POA: Diagnosis not present

## 2023-09-13 DIAGNOSIS — Z6836 Body mass index (BMI) 36.0-36.9, adult: Secondary | ICD-10-CM

## 2023-09-13 DIAGNOSIS — E669 Obesity, unspecified: Secondary | ICD-10-CM

## 2023-09-13 DIAGNOSIS — I10 Essential (primary) hypertension: Secondary | ICD-10-CM | POA: Diagnosis not present

## 2023-09-13 MED ORDER — LOSARTAN POTASSIUM 50 MG PO TABS
50.0000 mg | ORAL_TABLET | Freq: Every day | ORAL | 0 refills | Status: DC
Start: 2023-09-13 — End: 2024-02-14

## 2023-09-13 NOTE — Progress Notes (Signed)
WEIGHT SUMMARY AND BIOMETRICS  Vitals Temp: 98.4 F (36.9 C) BP: 124/84 Pulse Rate: 63 SpO2: 99 %   Anthropometric Measurements Height: 6\' 1"  (1.854 m) Weight: 279 lb (126.6 kg) BMI (Calculated): 36.82 Weight at Last Visit: 280lb Weight Lost Since Last Visit: 1lb Weight Gained Since Last Visit: 0 Starting Weight: 295lb Total Weight Loss (lbs): 16 lb (7.258 kg)   Body Composition  Body Fat %: 34.1 % Fat Mass (lbs): 95.4 lbs Muscle Mass (lbs): 175.4 lbs Total Body Water (lbs): 123.2 lbs Visceral Fat Rating : 21   Other Clinical Data Fasting: yes Labs: no Today's Visit #: 83 Starting Date: 11/26/19    Chief Complaint:   OBESITY Concetto is here to discuss his progress with his obesity treatment plan. He is on the practicing portion control and making smarter food choices, such as increasing vegetables and decreasing simple carbohydrates and states he is following his eating plan approximately 75 % of the time. He states he is exercising NEAT/Yard Work daily.   Interim History:  08/14/2023- OV with PCP/Dr. Lewie Chamber Labs completed on 08/15/2023 No change to medications EMG ordered to evaluate continued numbness of L Lateral thigh  Per pt- he has EMG and awaiting results. He reports current L lateral thigh numbness at present. He denies acute accident/injury prior to onset of numbness, approximately 8 months ago.  Subjective:   1. Essential hypertension BP at goal at OV He reports home readings SBP 110-120 DBP 70-90 He denies CP with exertion. He is on daily Losartan 50mg   2. Elevated serum creatinine Labs with [facility] Creatinine 1.20 0.60-1.30 mg/dl    [id number] 69 >54 calc    He is on daily losartan 50mg   Assessment/Plan:   1. Essential hypertension Refill - losartan (COZAAR) 50 MG tablet; Take 1 tablet (50 mg total) by mouth daily.  Dispense: 90 tablet; Refill: 0  2. Elevated serum creatinine Continue daily ACE and avoid Nephrotoxic  substances Monitor Labs  3. Obesity, current BMI 36.82  Avenir is currently in the action stage of change. As such, his goal is to continue with weight loss efforts. He has agreed to practicing portion control and making smarter food choices, such as increasing vegetables and decreasing simple carbohydrates.   Exercise goals: For substantial health benefits, adults should do at least 150 minutes (2 hours and 30 minutes) a week of moderate-intensity, or 75 minutes (1 hour and 15 minutes) a week of vigorous-intensity aerobic physical activity, or an equivalent combination of moderate- and vigorous-intensity aerobic activity. Aerobic activity should be performed in episodes of at least 10 minutes, and preferably, it should be spread throughout the week.  Behavioral modification strategies: increasing lean protein intake, decreasing simple carbohydrates, increasing vegetables, increasing water intake, no skipping meals, meal planning and cooking strategies, keeping healthy foods in the home, and planning for success.  Peace has agreed to follow-up with our clinic in 4 weeks. He was informed of the importance of frequent follow-up visits to maximize his success with intensive lifestyle modifications for his multiple health conditions.   Objective:   Blood pressure 124/84, pulse 63, temperature 98.4 F (36.9 C), height 6\' 1"  (1.854 m), weight 279 lb (126.6 kg), SpO2 99%. Body mass index is 36.81 kg/m.  General: Cooperative, alert, well developed, in no acute distress. HEENT: Conjunctivae and lids unremarkable. Cardiovascular: Regular rhythm.  Lungs: Normal work of breathing. Neurologic: No focal deficits.   Lab Results  Component Value Date   CREATININE 1.28 (H) 04/05/2023  BUN 11 04/05/2023   NA 142 04/05/2023   K 4.5 04/05/2023   CL 106 04/05/2023   CO2 25 04/05/2023   Lab Results  Component Value Date   ALT 32 04/05/2023   AST 25 04/05/2023   ALKPHOS 59 04/05/2023   BILITOT 0.3  04/05/2023   Lab Results  Component Value Date   HGBA1C 5.6 02/08/2023   HGBA1C 5.6 08/31/2022   HGBA1C 5.9 (H) 03/16/2022   HGBA1C 5.8 (H) 11/24/2021   HGBA1C 5.6 04/28/2021   Lab Results  Component Value Date   INSULIN 29.8 (H) 02/08/2023   INSULIN 15.7 08/31/2022   INSULIN 15.7 03/16/2022   INSULIN 8.8 04/28/2021   INSULIN 8.7 01/20/2021   Lab Results  Component Value Date   TSH 2.690 11/24/2021   Lab Results  Component Value Date   CHOL 172 02/08/2023   HDL 62 02/08/2023   LDLCALC 97 02/08/2023   LDLDIRECT 145.5 10/14/2011   TRIG 66 02/08/2023   CHOLHDL 2.8 02/08/2023   Lab Results  Component Value Date   VD25OH 53.4 02/08/2023   VD25OH 49.3 08/31/2022   VD25OH 55.9 03/16/2022   Lab Results  Component Value Date   WBC 5.4 11/24/2021   HGB 12.8 (L) 11/24/2021   HCT 37.0 (L) 11/24/2021   MCV 90 11/24/2021   PLT 168 11/24/2021   No results found for: "IRON", "TIBC", "FERRITIN"  Attestation Statements:   Reviewed by clinician on day of visit: allergies, medications, problem list, medical history, surgical history, family history, social history, and previous encounter notes.  I have reviewed the above documentation for accuracy and completeness, and I agree with the above. -  Cynthia Stainback d. Varonica Siharath, NP-C

## 2023-10-11 ENCOUNTER — Ambulatory Visit (INDEPENDENT_AMBULATORY_CARE_PROVIDER_SITE_OTHER): Payer: BC Managed Care – PPO | Admitting: Adult Health

## 2023-11-01 ENCOUNTER — Ambulatory Visit (INDEPENDENT_AMBULATORY_CARE_PROVIDER_SITE_OTHER): Payer: BC Managed Care – PPO | Admitting: Adult Health

## 2023-12-06 ENCOUNTER — Ambulatory Visit (INDEPENDENT_AMBULATORY_CARE_PROVIDER_SITE_OTHER): Payer: Self-pay | Admitting: Adult Health

## 2023-12-06 ENCOUNTER — Encounter (INDEPENDENT_AMBULATORY_CARE_PROVIDER_SITE_OTHER): Payer: Self-pay | Admitting: Adult Health

## 2023-12-06 VITALS — BP 109/72 | HR 99 | Temp 98.1°F | Ht 73.0 in | Wt 276.0 lb

## 2023-12-06 DIAGNOSIS — N529 Male erectile dysfunction, unspecified: Secondary | ICD-10-CM

## 2023-12-06 DIAGNOSIS — I10 Essential (primary) hypertension: Secondary | ICD-10-CM

## 2023-12-06 DIAGNOSIS — E7849 Other hyperlipidemia: Secondary | ICD-10-CM

## 2023-12-06 DIAGNOSIS — E669 Obesity, unspecified: Secondary | ICD-10-CM

## 2023-12-06 DIAGNOSIS — E785 Hyperlipidemia, unspecified: Secondary | ICD-10-CM

## 2023-12-06 DIAGNOSIS — E559 Vitamin D deficiency, unspecified: Secondary | ICD-10-CM

## 2023-12-06 DIAGNOSIS — R7989 Other specified abnormal findings of blood chemistry: Secondary | ICD-10-CM

## 2023-12-06 DIAGNOSIS — R7303 Prediabetes: Secondary | ICD-10-CM

## 2023-12-06 DIAGNOSIS — Z6836 Body mass index (BMI) 36.0-36.9, adult: Secondary | ICD-10-CM

## 2023-12-06 MED ORDER — ROSUVASTATIN CALCIUM 10 MG PO TABS: 10.0000 mg | ORAL_TABLET | Freq: Every day | ORAL | 0 refills | Status: DC

## 2023-12-06 MED ORDER — ZEPBOUND 2.5 MG/0.5ML ~~LOC~~ SOAJ: 2.5000 mg | SUBCUTANEOUS | 0 refills | Status: DC

## 2023-12-06 NOTE — Progress Notes (Signed)
 WEIGHT SUMMARY AND BIOMETRICS  Vitals Temp: 98.1 F (36.7 C) BP: 109/72 Pulse Rate: 99 SpO2: 98 %   Anthropometric Measurements Height: 6\' 1"  (1.854 m) Weight: 276 lb (125.2 kg) BMI (Calculated): 36.42 Weight at Last Visit: 279lbs Weight Lost Since Last Visit: 3lb Weight Gained Since Last Visit: 0 Starting Weight: 295lbs Total Weight Loss (lbs): 19 lb (8.618 kg)   Body Composition  Body Fat %: 33.2 % Fat Mass (lbs): 91.6 lbs Muscle Mass (lbs): 175.6 lbs Total Body Water (lbs): 121.6 lbs Visceral Fat Rating : 20   Other Clinical Data Fasting: yes Labs: no Today's Visit #: 10 Starting Date: 11/26/19    Chief Complaint:   OBESITY Edward Mccormick is here to discuss his progress with his obesity treatment plan. He is on the practicing portion control and making smarter food choices, such as increasing vegetables and decreasing simple carbohydrates and states he is following his eating plan approximately 75 % of the time. He states he is exercising Walking 30+ minutes 2 times per week.   Interim History:  He was recently seen by Urologist on 11/27/2023 Labs completed Started on Sildenafil 100mg   He denies sx's of hypotension  Subjective:   1. Essential hypertension BP soft at OV He has brought in home BP cuff- digital upper arm Pt demonstrated correct usage of machine Home readings: SBP 100-120 DBP 70-upper 80s  He is on daily Losartan  50mg  and for the last three days Sildenafil 100mg  Discussed how Sildenafil is a vasodilator and will also lower BP  2. Erectile dysfunction, unspecified erectile dysfunction type Urology OV Notes 11/27/2023: HPI Edward Mccormick is a 63 year old male here for annual examination. The patient generally gets a rectal examination and PSA test annually. He had some questions and concerns at his last visit about voiding dysfunction we suggest he try some Flomax but is turned out he is done very well over the past year does not require  this.  His concern today is a little bit of erectile dysfunction he says sometimes his desire is not quite what it used to be and he is having intercourse much less frequently. I we will check a testosterone  level today along with his PSA and metabolic panel. I have given a prescription of Viagra that he can take 1 hour before planned intercourse if he needs testosterone  supplementation we will initiate that if it turns out that is not enough to fix his erection will look at more advanced therapies such as a vacuum erection device penile injections.  Also down there follow-up in about 3 months to assess where he is in terms of testosterone  and his erectile dysfunction thanks  Assessment/Plan  Diagnoses and all orders for this visit:  Screening for prostate cancer - PSA, Total; Future  Erectile dysfunction due to arterial insufficiency - Testosterone ; Future - Comprehensive Metabolic Panel; Future  Other orders - cholecalciferol  (VITAMIN D3) 125 mcg (5,000 unit) capsule; Take 5,000 Units by mouth daily. - losartan  (COZAAR ) 50 mg tablet; Take 50 mg by mouth daily. - sildenafiL (VIAGRA) 100 mg tablet; Take 1 tablet (100 mg total) by mouth daily as needed for erectile dysfunction.   3. Vitamin D  deficiency  Latest Reference Range & Units 02/08/23 08:40  Vitamin D , 25-Hydroxy 30.0 - 100.0 ng/mL 53.4   He is on OTC Vit D3 supplementation  4. Elevated serum creatinine Creatinine 1.13 0.70 - 1.30 mg/dL   95/62/1308 6:57 PM EST [facility](CLIA# [id number])  eGFR 74 >59 mL/min/1.11m2   11/27/2023 7:09 PM EST [facility] [place] [facility](CLIA# [id number])    5. Prediabetes Lab Results  Component Value Date   HGBA1C 5.6 02/08/2023   HGBA1C 5.6 08/31/2022   HGBA1C 5.9 (H) 03/16/2022   He denies family hx of MEN 2 or MTC He denies personal hx of pancreatitis He denies ETOH use Discussed risks/benefits of GLP-1 and GLP-1/GIP therapy He was  previously on Saxenda - tolerated well  6. HLD Lipid Panel     Component Value Date/Time   CHOL 172 02/08/2023 0840   TRIG 66 02/08/2023 0840   HDL 62 02/08/2023 0840   CHOLHDL 2.8 02/08/2023 0840   CHOLHDL 4 09/11/2019 1001   VLDL 16.0 09/11/2019 1001   LDLCALC 97 02/08/2023 0840   LDLDIRECT 145.5 10/14/2011 0822   LABVLDL 13 02/08/2023 0840    He is on Rosuvastatin  10mg  (M-W-F) He denies myalgias  Assessment/Plan:   1. Essential hypertension (Primary) Remain well hydrated Closely monitor home BP and for sx's of hypotension Goal SBP 100-120 DBP 60-80 If consistently outside these parameters- alert HWW  2. Erectile dysfunction, unspecified erectile dysfunction type Remain well hydrated Closely monitor home BP and for sx's of hypotension Goal SBP 100-120 DBP 60-80 If consistently outside these parameters- alert HWW  F/u with Urology as directed  3. Vitamin D  deficiency Check Labs  4. Elevated serum creatinine Monitor labs Continue ARB therapy  5. Prediabetes Start tirzepatide  (ZEPBOUND ) 2.5 MG/0.5ML Pen Inject 2.5 mg into the skin once a week. Dispense: 3 mL, Refills: 0 ordered   6. HLD Refill rosuvastatin  (CRESTOR ) 10 MG tablet Take 1 tablet (10 mg total) by mouth daily. (M-W-F) Dispense: 90 tablet, Refills: 0 ordered   7. Obesity, current BMI 36.42 Start tirzepatide  (ZEPBOUND ) 2.5 MG/0.5ML Pen Inject 2.5 mg into the skin once a week. Dispense: 3 mL, Refills: 0 ordered   [name] is currently in the action stage of change. As such, his goal is to continue with weight loss efforts. He has agreed to practicing portion control and making smarter food choices, such as increasing vegetables and decreasing simple carbohydrates.   Exercise goals: For substantial health benefits, adults should do at least 150 minutes (2 hours and 30 minutes) a week of moderate-intensity, or 75 minutes (1 hour and 15 minutes) a week of vigorous-intensity aerobic physical activity, or an  equivalent combination of moderate- and vigorous-intensity aerobic activity. Aerobic activity should be performed in episodes of at least 10 minutes, and preferably, it should be spread throughout the week.  Behavioral modification strategies: increasing lean protein intake, decreasing simple carbohydrates, increasing vegetables, increasing water intake, no skipping meals, meal planning and cooking strategies, keeping healthy foods in the home, ways to avoid boredom eating, and planning for success.  Tyral has agreed to follow-up with our clinic in 4 weeks. He was informed of the importance of frequent follow-up visits to maximize his success with intensive lifestyle modifications for his multiple health conditions.   Paola was informed we would discuss his lab results at his next visit unless there is a critical issue that needs to be addressed sooner. Jeury agreed to keep his next visit at the agreed upon time to discuss these results.  Objective:   Blood pressure 109/72, pulse 99, temperature 98.1 F (36.7 C), height 6\' 1"  (1.854 m), weight 276 lb (125.2 kg), SpO2 98%. Body mass index is 36.41 kg/m.  General: Cooperative, alert, well developed, in no acute distress. HEENT: Conjunctivae and lids  unremarkable. Cardiovascular: Regular rhythm.  Lungs: Normal work of breathing. Neurologic: No focal deficits.   Lab Results  Component Value Date   CREATININE 1.28 (H) 04/05/2023   BUN 11 04/05/2023   NA 142 04/05/2023   K 4.5 04/05/2023   CL 106 04/05/2023   CO2 25 04/05/2023   Lab Results  Component Value Date   ALT 32 04/05/2023   AST 25 04/05/2023   ALKPHOS 59 04/05/2023   BILITOT 0.3 04/05/2023   Lab Results  Component Value Date   HGBA1C 5.6 02/08/2023   HGBA1C 5.6 08/31/2022   HGBA1C 5.9 (H) 03/16/2022   HGBA1C 5.8 (H) 11/24/2021   HGBA1C 5.6 04/28/2021   Lab Results  Component Value Date   INSULIN  29.8 (H) 02/08/2023   INSULIN  15.7 08/31/2022   INSULIN  15.7  03/16/2022   INSULIN  8.8 04/28/2021   INSULIN  8.7 01/20/2021   Lab Results  Component Value Date   TSH 2.690 11/24/2021   Lab Results  Component Value Date   CHOL 172 02/08/2023   HDL 62 02/08/2023   LDLCALC 97 02/08/2023   LDLDIRECT 145.5 10/14/2011   TRIG 66 02/08/2023   CHOLHDL 2.8 02/08/2023   Lab Results  Component Value Date   VD25OH 53.4 02/08/2023   VD25OH 49.3 08/31/2022   VD25OH 55.9 03/16/2022   Lab Results  Component Value Date   WBC 5.4 11/24/2021   HGB 12.8 (L) 11/24/2021   HCT 37.0 (L) 11/24/2021   MCV 90 11/24/2021   PLT 168 11/24/2021   No results found for: "IRON", "TIBC", "FERRITIN"  Attestation Statements:   Reviewed by clinician on day of visit: allergies, medications, problem list, medical history, surgical history, family history, social history, and previous encounter notes.  I have reviewed the above documentation for accuracy and completeness, and I agree with the above. -  Josealberto Montalto d. Alora Gorey, NP-C

## 2023-12-08 LAB — HEMOGLOBIN A1C
Est. average glucose Bld gHb Est-mCnc: 120 mg/dL
Hgb A1c MFr Bld: 5.8 % — ABNORMAL HIGH (ref 4.8–5.6)

## 2023-12-08 LAB — INSULIN, RANDOM: INSULIN: 16.7 u[IU]/mL (ref 2.6–24.9)

## 2023-12-08 LAB — VITAMIN D 25 HYDROXY (VIT D DEFICIENCY, FRACTURES): Vit D, 25-Hydroxy: 75.2 ng/mL (ref 30.0–100.0)

## 2023-12-08 LAB — VITAMIN B12: Vitamin B-12: 513 pg/mL (ref 232–1245)

## 2024-01-10 ENCOUNTER — Ambulatory Visit (INDEPENDENT_AMBULATORY_CARE_PROVIDER_SITE_OTHER): Payer: Self-pay | Admitting: Adult Health

## 2024-02-14 ENCOUNTER — Encounter (INDEPENDENT_AMBULATORY_CARE_PROVIDER_SITE_OTHER): Payer: Self-pay | Admitting: Adult Health

## 2024-02-14 ENCOUNTER — Ambulatory Visit (INDEPENDENT_AMBULATORY_CARE_PROVIDER_SITE_OTHER): Payer: 59 | Admitting: Adult Health

## 2024-02-14 VITALS — BP 112/74 | HR 63 | Temp 98.1°F | Ht 73.0 in | Wt 275.0 lb

## 2024-02-14 DIAGNOSIS — R7989 Other specified abnormal findings of blood chemistry: Secondary | ICD-10-CM | POA: Diagnosis not present

## 2024-02-14 DIAGNOSIS — I10 Essential (primary) hypertension: Secondary | ICD-10-CM

## 2024-02-14 DIAGNOSIS — E7849 Other hyperlipidemia: Secondary | ICD-10-CM

## 2024-02-14 DIAGNOSIS — E66812 Obesity, class 2: Secondary | ICD-10-CM

## 2024-02-14 DIAGNOSIS — Z6836 Body mass index (BMI) 36.0-36.9, adult: Secondary | ICD-10-CM

## 2024-02-14 DIAGNOSIS — R7303 Prediabetes: Secondary | ICD-10-CM

## 2024-02-14 DIAGNOSIS — E669 Obesity, unspecified: Secondary | ICD-10-CM

## 2024-02-14 MED ORDER — LOSARTAN POTASSIUM 50 MG PO TABS: 50.0000 mg | ORAL_TABLET | Freq: Every day | ORAL | 0 refills | Status: DC

## 2024-02-14 MED ORDER — TIRZEPATIDE-WEIGHT MANAGEMENT 2.5 MG/0.5ML ~~LOC~~ SOLN: 2.5000 mg | SUBCUTANEOUS | 0 refills | Status: DC

## 2024-02-14 MED ORDER — ROSUVASTATIN CALCIUM 10 MG PO TABS: 10.0000 mg | ORAL_TABLET | Freq: Every day | ORAL | 0 refills | Status: DC

## 2024-02-14 NOTE — Progress Notes (Signed)
 WEIGHT SUMMARY AND BIOMETRICS  Vitals Temp: 98.1 F (36.7 C) BP: 112/74 Pulse Rate: 63 SpO2: 100 %   Anthropometric Measurements Height: 6\' 1"  (1.854 m) Weight: 275 lb (124.7 kg) BMI (Calculated): 36.29 Weight at Last Visit: 276 lb Weight Lost Since Last Visit: 1 Weight Gained Since Last Visit: 0 Starting Weight: 295 lb Total Weight Loss (lbs): 20 lb (9.072 kg)   Body Composition  Body Fat %: 33.5 % Fat Mass (lbs): 92.4 lbs Muscle Mass (lbs): 174.4 lbs Total Body Water (lbs): 120.8 lbs Visceral Fat Rating : 20   Other Clinical Data Fasting: yes Labs: no Today's Visit #: 40 Starting Date: 12/06/19    Chief Complaint:   OBESITY Edward Mccormick is here to discuss his progress with his obesity treatment plan.  He is on the practicing portion control and making smarter food choices, such as increasing vegetables and decreasing simple carbohydrates and states he is following his eating plan approximately 80 % of the time.  He states he is exercising: Walking and NEAT Activities.   Interim History:  Attempted Zepbound Rx at last OV- insurance denied. Discussed [employer] Option He denies family hx of MENS 2 or MTC He denies personal hx of pancreatitis  He has been focused on his overall health and well being. Discussed the risk/benefits of Zepbound therapy and the myriad of medical conditions that are treatable with weekly Zepbound.  Hydration-he reports reduced water intake the last month and half  Subjective:   1. Essential hypertension BP at goal at OV He is on daily Losartan 50mg   2. Other hyperlipidemia Lipid panel stable the last several checks He is on Crestor 10mg - 1 tab on Mon, Wed, Fri He denies myalgias  Lipid Panel     Component Value Date/Time   CHOL 172 02/08/2023 0840   TRIG 66 02/08/2023 0840   HDL 62 02/08/2023 0840   CHOLHDL 2.8 02/08/2023 0840   CHOLHDL 4 09/11/2019 1001   VLDL 16.0 09/11/2019 1001   LDLCALC 97 02/08/2023 0840    LDLDIRECT 145.5 10/14/2011 0822   LABVLDL 13 02/08/2023 0840     3. Prediabetes Discussed Labs Lab Results  Component Value Date   HGBA1C 5.8 (H) 12/06/2023   HGBA1C 5.6 02/08/2023   HGBA1C 5.6 08/31/2022    He is not currently on any antidiabetic medications at this time. He has been on daily Saxenda- tolerated well Insurance has denied recent Zepbound Rx Discussed Cash Option via direct [employer]  4. Elevated serum creatinine BP at goal at OV He is on daily Losartan 50mg  He reports decreased water intake the last 6 weeks  Assessment/Plan:   1. Essential hypertension Refill - losartan (COZAAR) 50 MG tablet; Take 1 tablet (50 mg total) by mouth daily.  Dispense: 90 tablet; Refill: 0 Start Zepbound  2. Other hyperlipidemia Refill - rosuvastatin (CRESTOR) 10 MG tablet; Take 1 tablet (10 mg total) by mouth daily. (M-W-F)  Dispense: 90 tablet; Refill: 0 Start Zepbound  3. Prediabetes (Primary) Increase protein and limit sugar/simple CHO Remain active daily  4. Elevated serum creatinine Keep BP well controlled Avoid Nephrotoxic substances Monitor labs  5. Obesity, current BMI 36.4 Start  tirzepatide (ZEPBOUND) 2.5 MG/0.5ML injection vial Inject 2.5 mg into the skin once a week. Dispense: 2 mL, Refills: 0 ordered   Edward Mccormick is currently in the action stage of change. As such, his goal is to continue with weight loss efforts. He has agreed to practicing portion control and making  smarter food choices, such as increasing vegetables and decreasing simple carbohydrates.   INCREASE WATER INTAKE: Wake and drink a large glass of water Every hour drink 6 oz water Stop water intake 2 hours prior to bedtime  Exercise goals: All adults should avoid inactivity. Some physical activity is better than none, and adults who participate in any amount of physical activity gain some health benefits. Adults should also include muscle-strengthening activities that involve all major muscle  groups on 2 or more days a week.  Behavioral modification strategies: increasing lean protein intake, decreasing simple carbohydrates, increasing vegetables, increasing water intake, no skipping meals, meal planning and cooking strategies, keeping healthy foods in the home, planning for success, and decreasing junk food.  Edward Mccormick has agreed to follow-up with our clinic in 4 weeks. He was informed of the importance of frequent follow-up visits to maximize his success with intensive lifestyle modifications for his multiple health conditions.   Objective:   Blood pressure 112/74, pulse 63, temperature 98.1 F (36.7 C), height 6\' 1"  (1.854 m), weight 275 lb (124.7 kg), SpO2 100%. Body mass index is 36.28 kg/m.  General: Cooperative, alert, well developed, in no acute distress. HEENT: Conjunctivae and lids unremarkable. Cardiovascular: Regular rhythm.  Lungs: Normal work of breathing. Neurologic: No focal deficits.   Lab Results  Component Value Date   CREATININE 1.28 (H) 04/05/2023   BUN 11 04/05/2023   NA 142 04/05/2023   K 4.5 04/05/2023   CL 106 04/05/2023   CO2 25 04/05/2023   Lab Results  Component Value Date   ALT 32 04/05/2023   AST 25 04/05/2023   ALKPHOS 59 04/05/2023   BILITOT 0.3 04/05/2023   Lab Results  Component Value Date   HGBA1C 5.8 (H) 12/06/2023   HGBA1C 5.6 02/08/2023   HGBA1C 5.6 08/31/2022   HGBA1C 5.9 (H) 03/16/2022   HGBA1C 5.8 (H) 11/24/2021   Lab Results  Component Value Date   INSULIN 16.7 12/06/2023   INSULIN 29.8 (H) 02/08/2023   INSULIN 15.7 08/31/2022   INSULIN 15.7 03/16/2022   INSULIN 8.8 04/28/2021   Lab Results  Component Value Date   TSH 2.690 11/24/2021   Lab Results  Component Value Date   CHOL 172 02/08/2023   HDL 62 02/08/2023   LDLCALC 97 02/08/2023   LDLDIRECT 145.5 10/14/2011   TRIG 66 02/08/2023   CHOLHDL 2.8 02/08/2023   Lab Results  Component Value Date   VD25OH 75.2 12/06/2023   VD25OH 53.4 02/08/2023    VD25OH 49.3 08/31/2022   Lab Results  Component Value Date   WBC 5.4 11/24/2021   HGB 12.8 (L) 11/24/2021   HCT 37.0 (L) 11/24/2021   MCV 90 11/24/2021   PLT 168 11/24/2021   No results found for: "IRON", "TIBC", "FERRITIN"  Attestation Statements:   Reviewed by clinician on day of visit: allergies, medications, problem list, medical history, surgical history, family history, social history, and previous encounter notes.  I have reviewed the above documentation for accuracy and completeness, and I agree with the above. -  Blessen Kimbrough d. Nycere Presley, NP-C

## 2024-03-27 ENCOUNTER — Ambulatory Visit (INDEPENDENT_AMBULATORY_CARE_PROVIDER_SITE_OTHER): Admitting: Adult Health

## 2024-05-01 ENCOUNTER — Ambulatory Visit (INDEPENDENT_AMBULATORY_CARE_PROVIDER_SITE_OTHER): Admitting: Adult Health

## 2024-05-01 ENCOUNTER — Encounter (INDEPENDENT_AMBULATORY_CARE_PROVIDER_SITE_OTHER): Payer: Self-pay | Admitting: Adult Health

## 2024-05-01 VITALS — BP 117/81 | HR 78 | Temp 98.4°F | Ht 73.0 in | Wt 279.0 lb

## 2024-05-01 DIAGNOSIS — R7303 Prediabetes: Secondary | ICD-10-CM

## 2024-05-01 DIAGNOSIS — R7989 Other specified abnormal findings of blood chemistry: Secondary | ICD-10-CM

## 2024-05-01 DIAGNOSIS — Z6836 Body mass index (BMI) 36.0-36.9, adult: Secondary | ICD-10-CM

## 2024-05-01 DIAGNOSIS — E66812 Morbid (severe) obesity due to excess calories: Secondary | ICD-10-CM

## 2024-05-01 DIAGNOSIS — E7849 Other hyperlipidemia: Secondary | ICD-10-CM

## 2024-05-01 DIAGNOSIS — E669 Obesity, unspecified: Secondary | ICD-10-CM

## 2024-05-01 DIAGNOSIS — Z0000 Encounter for general adult medical examination without abnormal findings: Secondary | ICD-10-CM

## 2024-05-01 DIAGNOSIS — I10 Essential (primary) hypertension: Secondary | ICD-10-CM

## 2024-05-01 MED ORDER — TIRZEPATIDE-WEIGHT MANAGEMENT 2.5 MG/0.5ML ~~LOC~~ SOLN: 2.5000 mg | SUBCUTANEOUS | 0 refills | Status: DC

## 2024-05-01 MED ORDER — LOSARTAN POTASSIUM 50 MG PO TABS: 50.0000 mg | ORAL_TABLET | Freq: Every day | ORAL | 0 refills | Status: DC

## 2024-05-01 MED ORDER — ROSUVASTATIN CALCIUM 10 MG PO TABS: 10.0000 mg | ORAL_TABLET | Freq: Every day | ORAL | 0 refills | Status: DC

## 2024-05-01 NOTE — Progress Notes (Signed)
 WEIGHT SUMMARY AND BIOMETRICS  Vitals Temp: 98.4 F (36.9 C) BP: 117/81 Pulse Rate: 78 SpO2: 99 %   Anthropometric Measurements Height: 6' 1 (1.854 m) Weight: 279 lb (126.6 kg) BMI (Calculated): 36.82 Weight at Last Visit: 275 lb Weight Lost Since Last Visit: 4 lb Weight Gained Since Last Visit: 4 lb Starting Weight: 295 LB Total Weight Loss (lbs): 16 lb (7.258 kg)   Body Composition  Body Fat %: 34.4 % Fat Mass (lbs): 96 lbs Muscle Mass (lbs): 174.2 lbs Total Body Water (lbs): 128.4 lbs Visceral Fat Rating : 21   Other Clinical Data Fasting: NO Labs: NO Today's Visit #: 41 Starting Date: 12/06/19    Chief Complaint:   OBESITY Edward Mccormick is here to discuss his progress with his obesity treatment plan.  He is on the practicing portion control and making smarter food choices, such as increasing vegetables and decreasing simple carbohydrates and states he is following his eating plan approximately 80 % of the time.  He states he is exercising Walking 20 minutes 3 times per week.  Interim History:  PCP/Dr. Hammier at [facility] OV on 04/10/2024 Fasting Labs completed on 04/11/2024- reviewed results in detail at OV today  He was never contacted by [facility], re: Zepbound  vial Will resend in Rx and call company during OV today  Subjective:   1. Elevated serum creatinine Kidney fx improved! BP excellent at OV 04/11/2024 [facility] Creatinine 1.18 0.60-1.30 mg/dl    [id number] 70 >86 calc    2. Essential hypertension BP excellent at OV Home readings: SBP: 120s DBP: 70-80s  3. Other hyperlipidemia Reviewed date:04/11/2024 05:20:55 AM Interpretation: Performing Lab: Notes/Report: Testing Performed at: [facility], [address], [place], [place] [address]  Cholesterol 172 <200 mg/dL    CHOL/HDL 2.8 9.6-2.9 Ratio    HDLD 61 30-70 mg/dL Values below 40 mg/dL indicate increased risk factor  Triglyceride 75 0-199 mg/dL    NHDL 528 4-132  mg/dL Range dependent upon risk factors.  LDL Chol Calc (NIH) 97 0-99 mg/dL     Lipid panel stable He is on Crestor  10mg - takes 3 times weekly He denies myalgia  4. Prediabetes 04/11/2024 Testing Performed at: [facility], [address], [address], [place], [place] [address]  eAG 120      Hgb A1c 5.8 4.8-5.6 % Prediabetes: 5.7-6.4%  Diabetes: >/= 6.5%    He denies family hx of MENS 2 or MTC He denies personal hx of pancreatitis  He was never contacted by [employer], re: Zepbound  vial Will resend in Rx and call company during OV today  5. Healthcare maintenance 04/10/2024 PCP OV 04/11/2024 Fasting Labs completed- reviewed results with pt at OV  Assessment/Plan:   1. Elevated serum creatinine Continue daily Losartan  50mg   2. Essential hypertension (Primary) Refill  losartan  (COZAAR ) 50 MG tablet Take 1 tablet (50 mg total) by mouth daily. Dispense: 90 tablet, Refills: 0 ordered   3. Other hyperlipidemia Refill  rosuvastatin  (CRESTOR ) 10 MG tablet Take 1 tablet (10 mg total) by mouth daily. (M-W-F) Dispense: 90 tablet, Refills: 0 ordered   4. Prediabetes Continue healthy eating and increase regular exercise Start weekly Zepbound  therapy  5. Healthcare maintenance Fu with PCP as directed  6. Obesity, current BMI 36.8 Start tirzepatide  (ZEPBOUND ) 2.5 MG/0.5ML injection vial Inject 2.5 mg into the skin once a week. Dispense: 2 mL, Refills: 0 ordered   Edward Mccormick is currently in the action stage of change. As such,  his goal is to continue with weight loss efforts. He has agreed to practicing portion control and making smarter food choices, such as increasing vegetables and decreasing simple carbohydrates.   Exercise goals: All adults should avoid inactivity. Some physical activity is better than none, and adults who participate in any amount of physical activity gain some health benefits. Adults should also include muscle-strengthening activities that involve all major muscle  groups on 2 or more days a week. Strength Training 1 x week  Behavioral modification strategies: increasing lean protein intake, decreasing simple carbohydrates, increasing vegetables, increasing water intake, no skipping meals, meal planning and cooking strategies, keeping healthy foods in the home, ways to avoid boredom eating, and planning for success.  Edward Mccormick has agreed to follow-up with our clinic in 4 weeks. He was informed of the importance of frequent follow-up visits to maximize his success with intensive lifestyle modifications for his multiple health conditions.   Objective:   Blood pressure 117/81, pulse 78, temperature 98.4 F (36.9 C), height 6' 1 (1.854 m), weight 279 lb (126.6 kg), SpO2 99%. Body mass index is 36.81 kg/m.  General: Cooperative, alert, well developed, in no acute distress. HEENT: Conjunctivae and lids unremarkable. Cardiovascular: Regular rhythm.  Lungs: Normal work of breathing. Neurologic: No focal deficits.   Lab Results  Component Value Date   CREATININE 1.28 (H) 04/05/2023   BUN 11 04/05/2023   NA 142 04/05/2023   K 4.5 04/05/2023   CL 106 04/05/2023   CO2 25 04/05/2023   Lab Results  Component Value Date   ALT 32 04/05/2023   AST 25 04/05/2023   ALKPHOS 59 04/05/2023   BILITOT 0.3 04/05/2023   Lab Results  Component Value Date   HGBA1C 5.8 (H) 12/06/2023   HGBA1C 5.6 02/08/2023   HGBA1C 5.6 08/31/2022   HGBA1C 5.9 (H) 03/16/2022   HGBA1C 5.8 (H) 11/24/2021   Lab Results  Component Value Date   INSULIN  16.7 12/06/2023   INSULIN  29.8 (H) 02/08/2023   INSULIN  15.7 08/31/2022   INSULIN  15.7 03/16/2022   INSULIN  8.8 04/28/2021   Lab Results  Component Value Date   TSH 2.690 11/24/2021   Lab Results  Component Value Date   CHOL 172 02/08/2023   HDL 62 02/08/2023   LDLCALC 97 02/08/2023   LDLDIRECT 145.5 10/14/2011   TRIG 66 02/08/2023   CHOLHDL 2.8 02/08/2023   Lab Results  Component Value Date   VD25OH 75.2 12/06/2023    VD25OH 53.4 02/08/2023   VD25OH 49.3 08/31/2022   Lab Results  Component Value Date   WBC 5.4 11/24/2021   HGB 12.8 (L) 11/24/2021   HCT 37.0 (L) 11/24/2021   MCV 90 11/24/2021   PLT 168 11/24/2021   No results found for: IRON, TIBC, FERRITIN  Attestation Statements:   Reviewed by clinician on day of visit: allergies, medications, problem list, medical history, surgical history, family history, social history, and previous encounter notes.  I have reviewed the above documentation for accuracy and completeness, and I agree with the above. -  Tallyn Holroyd d. Yolonda Purtle, NP-C

## 2024-05-16 ENCOUNTER — Other Ambulatory Visit (INDEPENDENT_AMBULATORY_CARE_PROVIDER_SITE_OTHER): Payer: Self-pay | Admitting: Adult Health

## 2024-06-03 ENCOUNTER — Encounter (INDEPENDENT_AMBULATORY_CARE_PROVIDER_SITE_OTHER): Payer: Self-pay | Admitting: Adult Health

## 2024-06-05 ENCOUNTER — Encounter (INDEPENDENT_AMBULATORY_CARE_PROVIDER_SITE_OTHER): Payer: Self-pay | Admitting: Adult Health

## 2024-06-05 ENCOUNTER — Ambulatory Visit (INDEPENDENT_AMBULATORY_CARE_PROVIDER_SITE_OTHER): Admitting: Adult Health

## 2024-06-05 VITALS — BP 103/66 | HR 94 | Temp 98.3°F | Ht 73.0 in | Wt 267.0 lb

## 2024-06-05 DIAGNOSIS — E785 Hyperlipidemia, unspecified: Secondary | ICD-10-CM | POA: Diagnosis not present

## 2024-06-05 DIAGNOSIS — E66812 Obesity, class 2: Secondary | ICD-10-CM

## 2024-06-05 DIAGNOSIS — R7303 Prediabetes: Secondary | ICD-10-CM

## 2024-06-05 DIAGNOSIS — E669 Obesity, unspecified: Secondary | ICD-10-CM

## 2024-06-05 DIAGNOSIS — E782 Mixed hyperlipidemia: Secondary | ICD-10-CM

## 2024-06-05 DIAGNOSIS — Z6835 Body mass index (BMI) 35.0-35.9, adult: Secondary | ICD-10-CM

## 2024-06-05 DIAGNOSIS — R7989 Other specified abnormal findings of blood chemistry: Secondary | ICD-10-CM | POA: Diagnosis not present

## 2024-06-05 DIAGNOSIS — I10 Essential (primary) hypertension: Secondary | ICD-10-CM | POA: Diagnosis not present

## 2024-06-05 MED ORDER — TIRZEPATIDE-WEIGHT MANAGEMENT 2.5 MG/0.5ML ~~LOC~~ SOLN: 2.5000 mg | SUBCUTANEOUS | 1 refills | Status: DC

## 2024-06-05 NOTE — Progress Notes (Signed)
 WEIGHT SUMMARY AND BIOMETRICS  Vitals Temp: 98.3 F (36.8 C) BP: 103/66 Pulse Rate: 94 SpO2: 98 %   Anthropometric Measurements Height: 6' 1 (1.854 m) Weight: 267 lb (121.1 kg) BMI (Calculated): 35.23 Weight at Last Visit: 279 lb Weight Lost Since Last Visit: 12 lb Weight Gained Since Last Visit: 0 Starting Weight: 295 lb Total Weight Loss (lbs): 28 lb (12.7 kg)   Body Composition  Body Fat %: 32.4 % Fat Mass (lbs): 86.8 lbs Muscle Mass (lbs): 72 lbs Total Body Water (lbs): 21.2 lbs Visceral Fat Rating : 19   Other Clinical Data Fasting: yes Labs: no Today's Visit #: 42 Starting Date: 12/06/19 Comments: .    Chief Complaint:   OBESITY Edward Mccormick is here to discuss his progress with his obesity treatment plan.  He is on the practicing portion control and making smarter food choices, such as increasing vegetables and decreasing simple carbohydrates and states he is following his eating plan approximately 90 % of the time.  He states he is exercising Walking 30 minutes 3 times per week.  Interim History:  He started on weekly Zepbound  2.5mg  on/about 05/01/2024 He has had 4 doses Denies mass in neck, dysphagia, dyspepsia, persistent hoarseness, abdominal pain, or N/V/C  He is using the Cash Option via [employer]  Hunger/appetite-stable appetite  Exercise-Brisk Walking  Reviewed Bioimpedance results with pt: Muscle Mass: -2.2 lbs Adipose Mass: -9.2 lbs  Subjective:   1. Hyperlipidemia/prediabetes Lipid Panel     Component Value Date/Time   CHOL 172 02/08/2023 0840   TRIG 66 02/08/2023 0840   HDL 62 02/08/2023 0840   CHOLHDL 2.8 02/08/2023 0840   CHOLHDL 4 09/11/2019 1001   VLDL 16.0 09/11/2019 1001   LDLCALC 97 02/08/2023 0840   LDLDIRECT 145.5 10/14/2011 0822   LABVLDL 13 02/08/2023 0840    Lab Results  Component Value Date   HGBA1C 5.8 (H) 12/06/2023   HGBA1C 5.6 02/08/2023   HGBA1C 5.6 08/31/2022    He is on daily Crestor  10mg  and  weekly Zepbpund 2.5mg  He denies CP with exertion  2. Elevated serum creatinine  Latest Reference Range & Units 08/31/22 10:50 02/08/23 08:40 04/05/23 09:49  Creatinine 0.76 - 1.27 mg/dL 8.93 8.70 (H) 8.71 (H)  (H): Data is abnormally high  He is on daily Losartan  50mg   BP has been well since lat 2022  3. Essential hypertension BP soft at OV He denies sx's of hypotension He reports home readings: SBP: 110s DBP: 60-80s He is currently on daily Losartan  50mg   Assessment/Plan:   1. Hyperlipidemia/prediabetes Continue healthy eating and regular exercise Continue statin and GIP/GLP-1 therapy  2. Elevated serum creatinine Continue to keep BP well controlled Continue to avoid Nephrotoxic substances  3. Essential hypertension Closely monitor BP and for sx's of hypotension Remain well hydrated  4. Obesity, current BMI 35.3 Refill   tirzepatide  (ZEPBOUND ) 2.5 MG/0.5ML injection vial Inject 2.5 mg into the skin once a week. Dispense: 2 mL, Refills: 1 ordered   Edward Mccormick is currently in the action stage of change. As such, his goal is to continue with weight loss efforts. He has agreed to practicing portion control and making smarter food choices, such as increasing vegetables and decreasing simple carbohydrates.   Exercise goals: All adults should avoid inactivity. Some physical activity is better than none, and adults who participate in any amount of physical activity gain some health benefits. Adults should also include muscle-strengthening activities that involve all major muscle groups on 2  or more days a week. Brisk Walking  Behavioral modification strategies: increasing lean protein intake, decreasing simple carbohydrates, increasing vegetables, increasing water intake, no skipping meals, meal planning and cooking strategies, keeping healthy foods in the home, ways to avoid boredom eating, and planning for success.  [name] has agreed to follow-up with our clinic in 4 weeks. He was  informed of the importance of frequent follow-up visits to maximize his success with intensive lifestyle modifications for his multiple health conditions.   Check Fasting Labs Fall 2025  Objective:   Blood pressure 103/66, pulse 94, temperature 98.3 F (36.8 C), height 6' 1 (1.854 m), weight 267 lb (121.1 kg), SpO2 98%. Body mass index is 35.23 kg/m.  General: Cooperative, alert, well developed, in no acute distress. HEENT: Conjunctivae and lids unremarkable. Cardiovascular: Regular rhythm.  Lungs: Normal work of breathing. Neurologic: No focal deficits.   Lab Results  Component Value Date   CREATININE 1.28 (H) 04/05/2023   BUN 11 04/05/2023   NA 142 04/05/2023   K 4.5 04/05/2023   CL 106 04/05/2023   CO2 25 04/05/2023   Lab Results  Component Value Date   ALT 32 04/05/2023   AST 25 04/05/2023   ALKPHOS 59 04/05/2023   BILITOT 0.3 04/05/2023   Lab Results  Component Value Date   HGBA1C 5.8 (H) 12/06/2023   HGBA1C 5.6 02/08/2023   HGBA1C 5.6 08/31/2022   HGBA1C 5.9 (H) 03/16/2022   HGBA1C 5.8 (H) 11/24/2021   Lab Results  Component Value Date   INSULIN  16.7 12/06/2023   INSULIN  29.8 (H) 02/08/2023   INSULIN  15.7 08/31/2022   INSULIN  15.7 03/16/2022   INSULIN  8.8 04/28/2021   Lab Results  Component Value Date   TSH 2.690 11/24/2021   Lab Results  Component Value Date   CHOL 172 02/08/2023   HDL 62 02/08/2023   LDLCALC 97 02/08/2023   LDLDIRECT 145.5 10/14/2011   TRIG 66 02/08/2023   CHOLHDL 2.8 02/08/2023   Lab Results  Component Value Date   VD25OH 75.2 12/06/2023   VD25OH 53.4 02/08/2023   VD25OH 49.3 08/31/2022   Lab Results  Component Value Date   WBC 5.4 11/24/2021   HGB 12.8 (L) 11/24/2021   HCT 37.0 (L) 11/24/2021   MCV 90 11/24/2021   PLT 168 11/24/2021   No results found for: IRON, TIBC, FERRITIN  Attestation Statements:   Reviewed by clinician on day of visit: allergies, medications, problem list, medical history, surgical  history, family history, social history, and previous encounter notes.  I have reviewed the above documentation for accuracy and completeness, and I agree with the above. -  Samora Jernberg d. Alda Gaultney, NP-C

## 2024-07-15 ENCOUNTER — Other Ambulatory Visit (INDEPENDENT_AMBULATORY_CARE_PROVIDER_SITE_OTHER): Payer: Self-pay | Admitting: Adult Health

## 2024-07-17 ENCOUNTER — Ambulatory Visit (INDEPENDENT_AMBULATORY_CARE_PROVIDER_SITE_OTHER): Admitting: Adult Health

## 2024-07-17 ENCOUNTER — Encounter (INDEPENDENT_AMBULATORY_CARE_PROVIDER_SITE_OTHER): Payer: Self-pay | Admitting: Adult Health

## 2024-07-17 VITALS — BP 117/76 | HR 66 | Temp 97.7°F | Ht 73.0 in | Wt 255.0 lb

## 2024-07-17 DIAGNOSIS — Z6833 Body mass index (BMI) 33.0-33.9, adult: Secondary | ICD-10-CM

## 2024-07-17 DIAGNOSIS — E559 Vitamin D deficiency, unspecified: Secondary | ICD-10-CM | POA: Diagnosis not present

## 2024-07-17 DIAGNOSIS — E785 Hyperlipidemia, unspecified: Secondary | ICD-10-CM | POA: Diagnosis not present

## 2024-07-17 DIAGNOSIS — E782 Mixed hyperlipidemia: Secondary | ICD-10-CM

## 2024-07-17 DIAGNOSIS — Z0000 Encounter for general adult medical examination without abnormal findings: Secondary | ICD-10-CM

## 2024-07-17 DIAGNOSIS — R7989 Other specified abnormal findings of blood chemistry: Secondary | ICD-10-CM

## 2024-07-17 DIAGNOSIS — I10 Essential (primary) hypertension: Secondary | ICD-10-CM

## 2024-07-17 DIAGNOSIS — E669 Obesity, unspecified: Secondary | ICD-10-CM

## 2024-07-17 DIAGNOSIS — R7303 Prediabetes: Secondary | ICD-10-CM

## 2024-07-17 MED ORDER — ZEPBOUND 5 MG/0.5ML ~~LOC~~ SOLN
5.0000 mg | SUBCUTANEOUS | 1 refills | Status: DC
Start: 2024-07-17 — End: 2024-08-14

## 2024-07-17 MED ORDER — CYANOCOBALAMIN 500 MCG PO TABS: 500.0000 ug | ORAL_TABLET | Freq: Every day | ORAL | 0 refills | Status: AC

## 2024-07-17 NOTE — Progress Notes (Signed)
 WEIGHT SUMMARY AND BIOMETRICS  Vitals Temp: 97.7 F (36.5 C) BP: 117/76 Pulse Rate: 66 SpO2: 99 %   Anthropometric Measurements Height: 6' 1 (1.854 m) Weight: 255 lb (115.7 kg) BMI (Calculated): 33.65 Weight at Last Visit: 267 lb Weight Lost Since Last Visit: 12 lb Weight Gained Since Last Visit: 0 Starting Weight: 295 lb Total Weight Loss (lbs): 40 lb (18.1 kg)   Body Composition  Body Fat %: 31.3 % Fat Mass (lbs): 80 lbs Muscle Mass (lbs): 167 lbs Total Body Water (lbs): 117.4 lbs Visceral Fat Rating : 18   Other Clinical Data Fasting: yes Labs: no Today's Visit #: 39 Starting Date: 12/06/19    Chief Complaint:   OBESITY Edward Mccormick is here to discuss his progress with his obesity treatment plan.  He is on the practicing portion control and making smarter food choices, such as increasing vegetables and decreasing simple carbohydrates and states he is following his eating plan approximately 100 % of the time.  He states he is exercising Walking and Free Weight 30/15 minutes 3/3/ times per week.  Interim History:  Started on weekly Zepbound  2.5mg  on/about 05/01/2024 Denies mass in neck, dysphagia, dyspepsia, persistent hoarseness, abdominal pain, or N/V/worsening constipation Discussed risks/benefits of increasing to lowest mx dose- agreeable to increase  He has eliminated all sugar from diet the last 2 months  He presents today with L wrist static splint due to worsening wrist/hand pain. Recommend icing for 20 mins several times daily in addition to splint. If pain persists, rec to f/u with established Sports Med provider  Subjective:   1. Hyperlipidemia/prediabetes Lipid Panel     Component Value Date/Time   CHOL 172 02/08/2023 0840   TRIG 66 02/08/2023 0840   HDL 62 02/08/2023 0840   CHOLHDL 2.8 02/08/2023 0840   CHOLHDL 4 09/11/2019 1001   VLDL 16.0 09/11/2019 1001   LDLCALC 97 02/08/2023 0840   LDLDIRECT 145.5 10/14/2011 0822   LABVLDL 13  02/08/2023 0840    Lab Results  Component Value Date   HGBA1C 5.8 (H) 12/06/2023   HGBA1C 5.6 02/08/2023   HGBA1C 5.6 08/31/2022     2. Elevated serum creatinine He has been taking OTC Volcano supplement with CREATINE for > 2 months Discussed the risks, in regards to kidney fx with this supplement He is agreeable to stop and use B12 supplementation for improved energy levels  3. Vitamin D  deficiency He is on daily OTC VIt D3 2000 international units   4. Essential hypertension BP excellent at OV He is on daily Losartan  50mg  Home readings: SBP: 110-120s DBP: 60-80s He denies CP with exertion  5. Healthcare maintenance He is not on Levothyroxine therapy He denies family hx of MENS 2 or MTC He denies personal hx of pancreatitis  Assessment/Plan:   1. Hyperlipidemia/prediabetes Check Labs - Hemoglobin A1c - Insulin , random - Lipid panel  2. Elevated serum creatinine Check Labs - Comprehensive metabolic panel with GFR STOP VOLCANO SUPPLEMENT Avoid Nephrotoxic substances Start  cyanocobalamin  (VITAMIN B12) 500 MCG tablet Take 1 tablet (500 mcg total) by mouth daily. Dispense: 90 tablet, Refills: 0 ordered   3. Vitamin D  deficiency Check Labs - Vitamin D  (25 hydroxy) Continue OTC Vit D3 2000 international units   4. Essential hypertension Check Labs Continue daily Losartan  and to monitor home BP  7. Healthcare maintenance Check Labs - TSH + free T4  5. Obesity, current BMI 33.7 (Primary) Refill and INCREASE tirzepatide  (ZEPBOUND ) 5 MG/0.5ML injection vial Inject  5 mg into the skin once a week. Dispense: 2 mL, Refills: 1 ordered   Edward Mccormick is currently in the action stage of change. As such, his goal is to continue with weight loss efforts. He has agreed to practicing portion control and making smarter food choices, such as increasing vegetables and decreasing simple carbohydrates.   Exercise goals: For substantial health benefits, adults should do at least 150  minutes (2 hours and 30 minutes) a week of moderate-intensity, or 75 minutes (1 hour and 15 minutes) a week of vigorous-intensity aerobic physical activity, or an equivalent combination of moderate- and vigorous-intensity aerobic activity. Aerobic activity should be performed in episodes of at least 10 minutes, and preferably, it should be spread throughout the week.  Behavioral modification strategies: increasing lean protein intake, decreasing simple carbohydrates, increasing vegetables, increasing water intake, no skipping meals, meal planning and cooking strategies, keeping healthy foods in the home, ways to avoid boredom eating, and planning for success.  Edward Mccormick has agreed to follow-up with our clinic in 4 weeks. He was informed of the importance of frequent follow-up visits to maximize his success with intensive lifestyle modifications for his multiple health conditions.   Edward Mccormick was informed we would discuss his lab results at his next visit unless there is a critical issue that needs to be addressed sooner. Edward Mccormick agreed to keep his next visit at the agreed upon time to discuss these results.  Objective:   Blood pressure 117/76, pulse 66, temperature 97.7 F (36.5 C), height 6' 1 (1.854 m), weight 255 lb (115.7 kg), SpO2 99%. Body mass index is 33.64 kg/m.  General: Cooperative, alert, well developed, in no acute distress. HEENT: Conjunctivae and lids unremarkable. Cardiovascular: Regular rhythm.  Lungs: Normal work of breathing. Neurologic: No focal deficits.   Lab Results  Component Value Date   CREATININE 1.28 (H) 04/05/2023   BUN 11 04/05/2023   NA 142 04/05/2023   K 4.5 04/05/2023   CL 106 04/05/2023   CO2 25 04/05/2023   Lab Results  Component Value Date   ALT 32 04/05/2023   AST 25 04/05/2023   ALKPHOS 59 04/05/2023   BILITOT 0.3 04/05/2023   Lab Results  Component Value Date   HGBA1C 5.8 (H) 12/06/2023   HGBA1C 5.6 02/08/2023   HGBA1C 5.6 08/31/2022   HGBA1C  5.9 (H) 03/16/2022   HGBA1C 5.8 (H) 11/24/2021   Lab Results  Component Value Date   INSULIN  16.7 12/06/2023   INSULIN  29.8 (H) 02/08/2023   INSULIN  15.7 08/31/2022   INSULIN  15.7 03/16/2022   INSULIN  8.8 04/28/2021   Lab Results  Component Value Date   TSH 2.690 11/24/2021   Lab Results  Component Value Date   CHOL 172 02/08/2023   HDL 62 02/08/2023   LDLCALC 97 02/08/2023   LDLDIRECT 145.5 10/14/2011   TRIG 66 02/08/2023   CHOLHDL 2.8 02/08/2023   Lab Results  Component Value Date   VD25OH 75.2 12/06/2023   VD25OH 53.4 02/08/2023   VD25OH 49.3 08/31/2022   Lab Results  Component Value Date   WBC 5.4 11/24/2021   HGB 12.8 (L) 11/24/2021   HCT 37.0 (L) 11/24/2021   MCV 90 11/24/2021   PLT 168 11/24/2021   No results found for: IRON, TIBC, FERRITIN  Attestation Statements:   Reviewed by clinician on day of visit: allergies, medications, problem list, medical history, surgical history, family history, social history, and previous encounter notes.  I have reviewed the above documentation for accuracy and completeness, and  I agree with the above. - Ermagene Saidi d. Meloney Feld, NP-C

## 2024-07-18 LAB — COMPREHENSIVE METABOLIC PANEL WITH GFR
ALT: 19 IU/L (ref 0–44)
AST: 39 IU/L (ref 0–40)
Albumin: 4.3 g/dL (ref 3.9–4.9)
Alkaline Phosphatase: 44 IU/L (ref 44–121)
BUN/Creatinine Ratio: 9 — ABNORMAL LOW (ref 10–24)
BUN: 11 mg/dL (ref 8–27)
Bilirubin Total: 0.6 mg/dL (ref 0.0–1.2)
CO2: 24 mmol/L (ref 20–29)
Calcium: 9.6 mg/dL (ref 8.6–10.2)
Chloride: 104 mmol/L (ref 96–106)
Creatinine, Ser: 1.27 mg/dL (ref 0.76–1.27)
Globulin, Total: 2.4 g/dL (ref 1.5–4.5)
Glucose: 88 mg/dL (ref 70–99)
Potassium: 4.5 mmol/L (ref 3.5–5.2)
Sodium: 139 mmol/L (ref 134–144)
Total Protein: 6.7 g/dL (ref 6.0–8.5)
eGFR: 64 mL/min/1.73 (ref >59)

## 2024-07-18 LAB — LIPID PANEL
Chol/HDL Ratio: 2.8 ratio (ref 0.0–5.0)
Cholesterol, Total: 148 mg/dL (ref 100–199)
HDL: 52 mg/dL (ref >39)
LDL Chol Calc (NIH): 85 mg/dL (ref 0–99)
Triglycerides: 53 mg/dL (ref 0–149)
VLDL Cholesterol Cal: 11 mg/dL (ref 5–40)

## 2024-07-18 LAB — HEMOGLOBIN A1C
Est. average glucose Bld gHb Est-mCnc: 111 mg/dL
Hgb A1c MFr Bld: 5.5 % (ref 4.8–5.6)

## 2024-07-18 LAB — TSH+FREE T4
Free T4: 1.14 ng/dL (ref 0.82–1.77)
TSH: 2.4 u[IU]/mL (ref 0.450–4.500)

## 2024-07-18 LAB — VITAMIN D 25 HYDROXY (VIT D DEFICIENCY, FRACTURES): Vit D, 25-Hydroxy: 93.3 ng/mL (ref 30.0–100.0)

## 2024-07-18 LAB — INSULIN, RANDOM: INSULIN: 14.9 u[IU]/mL (ref 2.6–24.9)

## 2024-08-02 ENCOUNTER — Encounter (HOSPITAL_BASED_OUTPATIENT_CLINIC_OR_DEPARTMENT_OTHER): Payer: Self-pay | Admitting: Orthopedic Surgery

## 2024-08-02 ENCOUNTER — Encounter (HOSPITAL_BASED_OUTPATIENT_CLINIC_OR_DEPARTMENT_OTHER)
Admission: RE | Admit: 2024-08-02 | Discharge: 2024-08-02 | Disposition: A | Discharge status: Home / Self Care | Source: Ambulatory Visit | Attending: Orthopedic Surgery | Admitting: Orthopedic Surgery

## 2024-08-02 DIAGNOSIS — I10 Essential (primary) hypertension: Secondary | ICD-10-CM | POA: Diagnosis not present

## 2024-08-02 DIAGNOSIS — Z01818 Encounter for other preprocedural examination: Secondary | ICD-10-CM | POA: Diagnosis present

## 2024-08-02 DIAGNOSIS — Z01810 Encounter for preprocedural cardiovascular examination: Secondary | ICD-10-CM | POA: Insufficient documentation

## 2024-08-05 NOTE — H&P (Signed)
 PREOPERATIVE H&P  Chief Complaint: left [name]   HPI: Edward Mccormick is a 63 y.o. male here for left wrist pain. Previous he had been doing well after an injection but symptoms have returned. He has restarted wearing his brace. Symptoms are rated as moderate to severe, and have been worsening.  This is significantly impairing activities of daily living.  He has elected for surgical management.   Past Medical History:  Diagnosis Date   Asthma    Back pain    Constipation    HTN (hypertension)    Hx of adenomatous colonic polyps 04/26/2022   2 diminutive adenomas recall 2030   Hyperlipidemia    Joint pain    Seasonal allergies    Vitamin D  deficiency    Past Surgical History:  Procedure Laterality Date   COLONOSCOPY  04/19/2012   Dr.Gessner   INGUINAL HERNIA REPAIR     PILONIDAL CYST EXCISION     WISDOM TOOTH EXTRACTION     Social History   Socioeconomic History   Marital status: Married    Spouse name: Keylor Rands   Number of children: Not on file   Years of education: Not on file   Highest education level: Not on file  Occupational History   Occupation: Research scientist (life sciences): [employer]  Tobacco Use   Smoking status: Never   Smokeless tobacco: Never  Vaping Use   Vaping status: Never Used  Substance and Sexual Activity   Alcohol use: No   Drug use: No   Sexual activity: Yes    Partners: Female    Birth control/protection: None  Other Topics Concern   Not on file  Social History Narrative   Not on file   Social Drivers of Health   Financial Resource Strain: Not on file  Food Insecurity: Low Risk  (11/27/2023)   Received from [facility]   Hunger Vital Sign    Within the past 12 months, you worried that your food would run out before you got money to buy more: Never true    Within the past 12 months, the food you bought just didn't last and you didn't have money to get more. : Never true  Transportation Needs: Unmet  Transportation Needs (11/27/2023)   Received from [facility]    In the past 12 months, has lack of reliable transportation kept you from medical appointments, meetings, work or from getting things needed for daily living? : Yes  Physical Activity: Not on file  Stress: Not on file  Social Connections: Not on file   Family History  Problem Relation Age of Onset   Hypertension Mother    Anxiety disorder Mother    Obesity Mother    Prostate cancer Maternal Uncle    Colon cancer Maternal Uncle    Stomach cancer Neg Hx    Esophageal cancer Neg Hx    Rectal cancer Neg Hx    No Known Allergies Prior to Admission medications   Medication Sig Start Date End Date Taking? Authorizing Provider  cyanocobalamin  (VITAMIN B12) 500 MCG tablet Take 1 tablet (500 mcg total) by mouth daily. 07/17/24  Yes Danford, Rockie D, NP  losartan  (COZAAR ) 50 MG tablet Take 1 tablet (50 mg total) by mouth daily. 05/01/24  Yes Danford, Rockie D, NP  rosuvastatin  (CRESTOR ) 10 MG tablet Take 1 tablet (10 mg total) by mouth daily. (M-W-F) 05/01/24 08/02/24 Yes Danford, Rockie BIRCH, NP  VITAMIN D  PO  Take by mouth.   Yes [provider]  sildenafil (VIAGRA) 100 MG tablet Take 100 mg by mouth daily as needed.    [provider]  tirzepatide  (ZEPBOUND ) 5 MG/0.5ML injection vial Inject 5 mg into the skin once a week. 07/17/24   Danford, Rockie D, NP     Positive ROS: All other systems have been reviewed and were otherwise negative with the exception of those mentioned in the HPI and as above.  Physical Exam: General: Alert, no acute distress Cardiovascular: No pedal edema Respiratory: No cyanosis, no use of accessory musculature GI: No organomegaly, abdomen is soft and non-tender Skin: No lesions in the area of chief complaint Neurologic: Sensation intact distally Psychiatric: Patient is competent for consent with normal mood and affect Lymphatic: No axillary or cervical  lymphadenopathy  MUSCULOSKELETAL: On examination the left wrist has tenderness along the radial aspect and pain over the first dorsal compartment with ulnar deviation.    Assessment: Left de Quervain's.    Plan: Plan for Procedure(s): INCISION, TENDON SHEATH  The risks benefits and alternatives were discussed with the patient including but not limited to the risks of nonoperative treatment, versus surgical intervention including infection, bleeding, nerve injury,  blood clots, cardiopulmonary complications, morbidity, mortality, among others, and they were willing to proceed.    Army MARLA Daring, PA-C    08/05/2024 11:09 AM

## 2024-08-06 ENCOUNTER — Other Ambulatory Visit: Payer: Self-pay

## 2024-08-06 ENCOUNTER — Ambulatory Visit (HOSPITAL_BASED_OUTPATIENT_CLINIC_OR_DEPARTMENT_OTHER): Payer: Self-pay | Admitting: Anesthesiology

## 2024-08-06 ENCOUNTER — Encounter (HOSPITAL_BASED_OUTPATIENT_CLINIC_OR_DEPARTMENT_OTHER)
Admission: RE | Disposition: A | Discharge status: Home / Self Care | Payer: Self-pay | Source: Home / Self Care | Attending: Orthopedic Surgery

## 2024-08-06 ENCOUNTER — Encounter (HOSPITAL_BASED_OUTPATIENT_CLINIC_OR_DEPARTMENT_OTHER): Payer: Self-pay | Admitting: Orthopedic Surgery

## 2024-08-06 ENCOUNTER — Ambulatory Visit (HOSPITAL_BASED_OUTPATIENT_CLINIC_OR_DEPARTMENT_OTHER)
Admission: RE | Admit: 2024-08-06 | Discharge: 2024-08-06 | Disposition: A | Discharge status: Home / Self Care | Attending: Orthopedic Surgery | Admitting: Orthopedic Surgery

## 2024-08-06 DIAGNOSIS — E66813 Obesity, class 3: Secondary | ICD-10-CM | POA: Diagnosis not present

## 2024-08-06 DIAGNOSIS — Z79899 Other long term (current) drug therapy: Secondary | ICD-10-CM | POA: Diagnosis not present

## 2024-08-06 DIAGNOSIS — M654 Radial styloid tenosynovitis [de Quervain]: Secondary | ICD-10-CM | POA: Insufficient documentation

## 2024-08-06 DIAGNOSIS — Z8249 Family history of ischemic heart disease and other diseases of the circulatory system: Secondary | ICD-10-CM | POA: Insufficient documentation

## 2024-08-06 DIAGNOSIS — Z6833 Body mass index (BMI) 33.0-33.9, adult: Secondary | ICD-10-CM

## 2024-08-06 DIAGNOSIS — J45909 Unspecified asthma, uncomplicated: Secondary | ICD-10-CM | POA: Insufficient documentation

## 2024-08-06 DIAGNOSIS — I10 Essential (primary) hypertension: Secondary | ICD-10-CM | POA: Diagnosis not present

## 2024-08-06 DIAGNOSIS — G709 Myoneural disorder, unspecified: Secondary | ICD-10-CM | POA: Diagnosis not present

## 2024-08-06 HISTORY — DX: Hyperlipidemia, unspecified: E78.5

## 2024-08-06 HISTORY — PX: TENOLYSIS: SHX396

## 2024-08-06 SURGERY — INCISION, TENDON SHEATH
Anesthesia: General | Site: Wrist | Laterality: Left

## 2024-08-06 MED ORDER — ACETAMINOPHEN 500 MG PO TABS
1000.0000 mg | ORAL_TABLET | Freq: Once | ORAL | Status: AC
  Administered 2024-08-06: 1000 mg via ORAL

## 2024-08-06 MED ORDER — LIDOCAINE 2% (20 MG/ML) 5 ML SYRINGE
INTRAMUSCULAR | Status: DC | PRN
  Administered 2024-08-06: 100 mg via INTRAVENOUS

## 2024-08-06 MED ORDER — ONDANSETRON HCL 4 MG/2ML IJ SOLN: 4.0000 mg | Freq: Once | INTRAMUSCULAR | Status: DC | PRN

## 2024-08-06 MED ORDER — LIDOCAINE 2% (20 MG/ML) 5 ML SYRINGE
INTRAMUSCULAR | Status: AC
  Filled 2024-08-06: qty 5

## 2024-08-06 MED ORDER — PHENYLEPHRINE 80 MCG/ML (10ML) SYRINGE FOR IV PUSH (FOR BLOOD PRESSURE SUPPORT)
PREFILLED_SYRINGE | INTRAVENOUS | Status: AC
  Filled 2024-08-06: qty 10

## 2024-08-06 MED ORDER — PROPOFOL 10 MG/ML IV BOLUS
INTRAVENOUS | Status: AC
Start: 2024-08-06 — End: 2024-08-06
  Filled 2024-08-06: qty 20

## 2024-08-06 MED ORDER — MEPERIDINE HCL 25 MG/ML IJ SOLN: 6.2500 mg | INTRAMUSCULAR | Status: DC | PRN

## 2024-08-06 MED ORDER — PROPOFOL 10 MG/ML IV BOLUS
INTRAVENOUS | Status: DC | PRN
  Administered 2024-08-06: 160 mg via INTRAVENOUS

## 2024-08-06 MED ORDER — OXYCODONE HCL 5 MG/5ML PO SOLN: 5.0000 mg | Freq: Once | ORAL | Status: DC | PRN

## 2024-08-06 MED ORDER — CEFAZOLIN SODIUM-DEXTROSE 2-4 GM/100ML-% IV SOLN
INTRAVENOUS | Status: AC
Start: 2024-08-06 — End: 2024-08-06
  Filled 2024-08-06: qty 100

## 2024-08-06 MED ORDER — OXYCODONE HCL 5 MG PO TABS: 5.0000 mg | ORAL_TABLET | Freq: Once | ORAL | Status: DC | PRN

## 2024-08-06 MED ORDER — ACETAMINOPHEN 500 MG PO TABS
ORAL_TABLET | ORAL | Status: AC
Start: 2024-08-06 — End: 2024-08-06
  Filled 2024-08-06: qty 2

## 2024-08-06 MED ORDER — CEFAZOLIN SODIUM-DEXTROSE 2-4 GM/100ML-% IV SOLN
2.0000 g | INTRAVENOUS | Status: AC
  Administered 2024-08-06: 2 g via INTRAVENOUS

## 2024-08-06 MED ORDER — ONDANSETRON HCL 4 MG/2ML IJ SOLN
INTRAMUSCULAR | Status: AC
  Filled 2024-08-06: qty 2

## 2024-08-06 MED ORDER — PHENYLEPHRINE 80 MCG/ML (10ML) SYRINGE FOR IV PUSH (FOR BLOOD PRESSURE SUPPORT)
PREFILLED_SYRINGE | INTRAVENOUS | Status: DC | PRN
  Administered 2024-08-06 (×3): 80 ug via INTRAVENOUS
  Administered 2024-08-06: 160 ug via INTRAVENOUS

## 2024-08-06 MED ORDER — FENTANYL CITRATE (PF) 100 MCG/2ML IJ SOLN
INTRAMUSCULAR | Status: AC
  Filled 2024-08-06: qty 2

## 2024-08-06 MED ORDER — POVIDONE-IODINE 7.5 % EX SOLN
Freq: Once | CUTANEOUS | Status: AC
Start: 2024-08-06 — End: 2024-08-06
  Filled 2024-08-06: qty 118

## 2024-08-06 MED ORDER — DEXAMETHASONE SODIUM PHOSPHATE 10 MG/ML IJ SOLN
INTRAMUSCULAR | Status: DC | PRN
  Administered 2024-08-06: 10 mg via INTRAVENOUS

## 2024-08-06 MED ORDER — LACTATED RINGERS IV SOLN: INTRAVENOUS | Status: DC

## 2024-08-06 MED ORDER — FENTANYL CITRATE (PF) 100 MCG/2ML IJ SOLN: 25.0000 ug | INTRAMUSCULAR | Status: DC | PRN

## 2024-08-06 MED ORDER — MIDAZOLAM HCL 5 MG/5ML IJ SOLN
INTRAMUSCULAR | Status: DC | PRN
  Administered 2024-08-06: 2 mg via INTRAVENOUS

## 2024-08-06 MED ORDER — POVIDONE-IODINE 10 % EX SWAB
2.0000 | Freq: Once | CUTANEOUS | Status: AC
  Administered 2024-08-06: 2 via TOPICAL

## 2024-08-06 MED ORDER — BUPIVACAINE HCL (PF) 0.5 % IJ SOLN
INTRAMUSCULAR | Status: DC | PRN
  Administered 2024-08-06: 10 mL

## 2024-08-06 MED ORDER — ACETAMINOPHEN 500 MG PO TABS
1000.0000 mg | ORAL_TABLET | Freq: Once | ORAL | Status: DC
Start: 2024-08-06 — End: 2024-08-06

## 2024-08-06 MED ORDER — HYDROCODONE-ACETAMINOPHEN 5-325 MG PO TABS: 1.0000 | ORAL_TABLET | ORAL | 0 refills | Status: DC | PRN

## 2024-08-06 MED ORDER — DEXMEDETOMIDINE HCL IN NACL 80 MCG/20ML IV SOLN
INTRAVENOUS | Status: DC | PRN
  Administered 2024-08-06 (×2): 4 ug via INTRAVENOUS

## 2024-08-06 MED ORDER — CELECOXIB 200 MG PO CAPS: 200.0000 mg | ORAL_CAPSULE | Freq: Once | ORAL | Status: DC

## 2024-08-06 MED ORDER — DEXAMETHASONE SODIUM PHOSPHATE 10 MG/ML IJ SOLN
INTRAMUSCULAR | Status: AC
  Filled 2024-08-06: qty 1

## 2024-08-06 MED ORDER — MIDAZOLAM HCL 2 MG/2ML IJ SOLN
INTRAMUSCULAR | Status: AC
  Filled 2024-08-06: qty 2

## 2024-08-06 MED ORDER — ONDANSETRON HCL 4 MG/2ML IJ SOLN
INTRAMUSCULAR | Status: DC | PRN
  Administered 2024-08-06: 4 mg via INTRAVENOUS

## 2024-08-06 MED ORDER — FENTANYL CITRATE (PF) 100 MCG/2ML IJ SOLN
INTRAMUSCULAR | Status: DC | PRN
  Administered 2024-08-06: 50 ug via INTRAVENOUS
  Administered 2024-08-06 (×2): 25 ug via INTRAVENOUS

## 2024-08-06 SURGICAL SUPPLY — 38 items
BLADE SURG 15 STRL LF DISP TIS (BLADE) ×1 IMPLANT
BNDG COMPR ESMARK 4X3 LF (GAUZE/BANDAGES/DRESSINGS) IMPLANT
BNDG ELASTIC 3INX 5YD STR LF (GAUZE/BANDAGES/DRESSINGS) ×1 IMPLANT
BNDG ELASTIC 4INX 5YD STR LF (GAUZE/BANDAGES/DRESSINGS) IMPLANT
CORD BIPOLAR FORCEPS 12FT (ELECTRODE) ×1 IMPLANT
COVER BACK TABLE 60X90IN (DRAPES) ×1 IMPLANT
CUFF TOURN SGL QUICK 18X4 (TOURNIQUET CUFF) IMPLANT
DRAPE EXTREMITY T 121X128X90 (DISPOSABLE) ×1 IMPLANT
DRAPE HALF SHEET 40X57 (DRAPES) IMPLANT
DRAPE IMP U-DRAPE 54X76 (DRAPES) ×1 IMPLANT
DRAPE SURG 17X23 STRL (DRAPES) ×1 IMPLANT
DURAPREP 26ML APPLICATOR (WOUND CARE) ×1 IMPLANT
GAUZE SPONGE 4X4 12PLY STRL (GAUZE/BANDAGES/DRESSINGS) ×1 IMPLANT
GAUZE XEROFORM 1X8 LF (GAUZE/BANDAGES/DRESSINGS) IMPLANT
GLOVE BIO SURGEON STRL SZ 6.5 (GLOVE) IMPLANT
GLOVE BIO SURGEON STRL SZ7 (GLOVE) ×1 IMPLANT
GLOVE BIOGEL PI IND STRL 7.0 (GLOVE) ×1 IMPLANT
GLOVE BIOGEL PI IND STRL 8 (GLOVE) ×2 IMPLANT
GLOVE ORTHO TXT STRL SZ7.5 (GLOVE) ×1 IMPLANT
GOWN STRL REUS W/ TWL LRG LVL3 (GOWN DISPOSABLE) ×1 IMPLANT
GOWN STRL REUS W/ TWL XL LVL3 (GOWN DISPOSABLE) ×2 IMPLANT
NDL HYPO 25X1 1.5 SAFETY (NEEDLE) IMPLANT
NEEDLE HYPO 25X1 1.5 SAFETY (NEEDLE) ×1 IMPLANT
NS IRRIG 1000ML POUR BTL (IV SOLUTION) ×1 IMPLANT
PACK BASIN DAY SURGERY FS (CUSTOM PROCEDURE TRAY) ×1 IMPLANT
PAD CAST 3X4 CTTN HI CHSV (CAST SUPPLIES) ×1 IMPLANT
PAD CAST 4YDX4 CTTN HI CHSV (CAST SUPPLIES) IMPLANT
PADDING CAST ABS COTTON 3X4 (CAST SUPPLIES) ×1 IMPLANT
PADDING CAST ABS COTTON 4X4 ST (CAST SUPPLIES) ×1 IMPLANT
SPLINT PLASTER CAST XFAST 3X15 (CAST SUPPLIES) IMPLANT
SPLINT PLASTER CAST XFAST 4X15 (CAST SUPPLIES) IMPLANT
STOCKINETTE 4X48 STRL (DRAPES) ×1 IMPLANT
SUT ETHILON 3 0 PS 1 (SUTURE) IMPLANT
SUT ETHILON 4 0 PS 2 18 (SUTURE) ×1 IMPLANT
SYR BULB EAR ULCER 3OZ GRN STR (SYRINGE) ×1 IMPLANT
SYR CONTROL 10ML LL (SYRINGE) IMPLANT
TOWEL GREEN STERILE FF (TOWEL DISPOSABLE) ×1 IMPLANT
UNDERPAD 30X36 HEAVY ABSORB (UNDERPADS AND DIAPERS) ×1 IMPLANT

## 2024-08-06 NOTE — Anesthesia Procedure Notes (Signed)
 Procedure Name: LMA Insertion Date/Time: 08/06/2024 9:17 AM  Performed by: Kennet Mccort D, CRNAPre-anesthesia Checklist: Patient identified, Emergency Drugs available, Suction available and Patient being monitored Patient Re-evaluated:Patient Re-evaluated prior to induction Oxygen Delivery Method: Circle system utilized Preoxygenation: Pre-oxygenation with 100% oxygen Induction Type: IV induction Ventilation: Mask ventilation without difficulty LMA: LMA inserted LMA Size: 5.0 Tube type: Oral Number of attempts: 1 Placement Confirmation: positive ETCO2 and breath sounds checked- equal and bilateral Tube secured with: Tape Dental Injury: Teeth and Oropharynx as per pre-operative assessment

## 2024-08-06 NOTE — Anesthesia Preprocedure Evaluation (Addendum)
 Anesthesia Evaluation  Patient identified by MRN, date of birth, ID band Patient awake    Reviewed: Allergy & Precautions, H&P , NPO status , Patient's Chart, lab work & pertinent test results  Airway Mallampati: II  TM Distance: >3 FB Neck ROM: Full    Dental no notable dental hx. (+) Teeth Intact, Dental Advisory Given, Caps   Pulmonary neg pulmonary ROS, asthma    Pulmonary exam normal breath sounds clear to auscultation       Cardiovascular Exercise Tolerance: Good hypertension, Pt. on medications negative cardio ROS Normal cardiovascular exam Rhythm:Regular Rate:Normal     Neuro/Psych  Neuromuscular disease negative neurological ROS  negative psych ROS   GI/Hepatic negative GI ROS, Neg liver ROS,,,  Endo/Other  negative endocrine ROS  Class 3 obesity  Renal/GU negative Renal ROS  negative genitourinary   Musculoskeletal negative musculoskeletal ROS (+)    Abdominal   Peds negative pediatric ROS (+)  Hematology negative hematology ROS (+)   Anesthesia Other Findings   Reproductive/Obstetrics negative OB ROS                              Anesthesia Physical Anesthesia Plan  ASA: 3  Anesthesia Plan: General   Post-op Pain Management: Tylenol  PO (pre-op)* and Celebrex  PO (pre-op)*   Induction: Intravenous  PONV Risk Score and Plan: 2 and Ondansetron , Dexamethasone  and Treatment may vary due to age or medical condition  Airway Management Planned: LMA  Additional Equipment: None  Intra-op Plan:   Post-operative Plan:   Informed Consent: I have reviewed the patients History and Physical, chart, labs and discussed the procedure including the risks, benefits and alternatives for the proposed anesthesia with the patient or authorized representative who has indicated his/her understanding and acceptance.       Plan Discussed with: CRNA, Surgeon and  Anesthesiologist  Anesthesia Plan Comments: ( )         Anesthesia Quick Evaluation

## 2024-08-06 NOTE — Discharge Instructions (Addendum)
 Diet: As you were doing prior to hospitalization   Shower:  May shower but keep the wounds dry, use an occlusive plastic wrap, NO SOAKING IN TUB. Leave the splint in place and keep the splint dry with a plastic bag.  Dressing: Leave the splint in place and we will change your bandages during your first follow-up appointment.    Activity:  Increase activity slowly as tolerated, but follow the weight bearing instructions below.  The rules on driving is that you can not be taking narcotics while you drive, and you must feel in control of the vehicle.    Weight Bearing:   non weight bearing with left hand.    To prevent constipation: you may use a stool softener such as -  Colace (over the counter) 100 mg by mouth twice a day  Drink plenty of fluids (prune juice may be helpful) and high fiber foods Miralax (over the counter) for constipation as needed.    Itching:  If you experience itching with your medications, try taking only a single pain pill, or even half a pain pill at a time.  You may take up to 10 pain pills per day, and you can also use benadryl over the counter for itching or also to help with sleep.   Precautions:  If you experience chest pain or shortness of breath - call 911 immediately for transfer to the hospital emergency department!!  If you develop a fever greater that 101 F, purulent drainage from wound, increased redness or drainage from wound, or calf pain -- Call the office at [phone]                                                Follow- Up Appointment:  Please call for an appointment to be seen in 2 weeks [facility] - [phone]    No Tylenol  until 1:21pm today, if needed.

## 2024-08-06 NOTE — Transfer of Care (Signed)
 Immediate Anesthesia Transfer of Care Note  Patient: Edward Mccormick  Procedure(s) Performed: INCISION, TENDON SHEATH (Left: Wrist)  Patient Location: PACU  Anesthesia Type:General  Level of Consciousness: awake, alert , and oriented  Airway & Oxygen Therapy: Patient Spontanous Breathing and Patient connected to face mask oxygen  Post-op Assessment: Report given to RN and Post -op Vital signs reviewed and stable  Post vital signs: Reviewed and stable  Last Vitals:  Vitals Value Taken Time  BP    Temp    Pulse 60 08/06/24 10:05  Resp 14 08/06/24 10:05  SpO2 100 % 08/06/24 10:05  Vitals shown include unfiled device data.  Last Pain:  Vitals:   08/06/24 0716  TempSrc: Temporal  PainSc: 2       Patients Stated Pain Goal: 5 (08/06/24 0716)  Complications: No notable events documented.

## 2024-08-06 NOTE — Interval H&P Note (Signed)
 History and Physical Interval Note:  08/06/2024 8:20 AM  Edward Mccormick  has presented today for surgery, with the diagnosis of De Quervain's.  The various methods of treatment have been discussed with the patient and family. After consideration of risks, benefits and other options for treatment, the patient has consented to  Procedure(s) with comments: INCISION, TENDON SHEATH (Left) - DeQuervains as a surgical intervention.  The patient's history has been reviewed, patient examined, no change in status, stable for surgery.  I have reviewed the patient's chart and labs.  Questions were answered to the patient's satisfaction.     Edward Mccormick

## 2024-08-06 NOTE — Anesthesia Postprocedure Evaluation (Signed)
 Anesthesia Post Note  Patient: Edward Mccormick  Procedure(s) Performed: INCISION, TENDON SHEATH (Left: Wrist)     Patient location during evaluation: PACU Anesthesia Type: General Level of consciousness: awake and alert Pain management: pain level controlled Vital Signs Assessment: post-procedure vital signs reviewed and stable Respiratory status: spontaneous breathing, nonlabored ventilation, respiratory function stable and patient connected to nasal cannula oxygen Cardiovascular status: blood pressure returned to baseline and stable Postop Assessment: no apparent nausea or vomiting Anesthetic complications: no   No notable events documented.  Last Vitals:  Vitals:   08/06/24 1030 08/06/24 1045  BP: 95/75 103/85  Pulse: (!) 56 (!) 54  Resp: 14 11  Temp:    SpO2: 98% 100%    Last Pain:  Vitals:   08/06/24 1045  TempSrc:   PainSc: 0-No pain                 Ronetta Molla

## 2024-08-06 NOTE — Op Note (Signed)
 08/06/2024  10:00 AM  PATIENT:  Edward Mccormick    PRE-OPERATIVE DIAGNOSIS: Left hand de Quervain's stenosing tenosynovitis  POST-OPERATIVE DIAGNOSIS:  Same  PROCEDURE: Left first dorsal compartment release  SURGEON:  Fonda SHAUNNA Olmsted, MD  PHYSICIAN ASSISTANT: Army Daring, PA-C, present and scrubbed throughout the case, critical for completion in a timely fashion, and for retraction, instrumentation, and closure.  ANESTHESIA:   General  PREOPERATIVE INDICATIONS:  Edward Mccormick is a  63 y.o. male with a diagnosis of left [name] who failed conservative measures and elected for surgical management.    The risks benefits and alternatives were discussed with the patient preoperatively including but not limited to the risks of infection, bleeding, nerve injury, cardiopulmonary complications, the need for revision surgery, among others, and the patient was willing to proceed.  ESTIMATED BLOOD LOSS: Minimal  OPERATIVE IMPLANTS:   * No implants in log *  OPERATIVE FINDINGS: Thickening of the first dorsal compartment with entrapment of the extensor pollicis brevis and abductor pollicis longus  OPERATIVE PROCEDURE: The patient was brought to the operating room and placed in the supine position.  General anesthesia was administered.  The left upper extremity was prepped and draped in usual sterile fashion.  Timeout performed.  The arm was elevated and exsanguinated and a tourniquet was inflated to 250 mmHg.  A transverse incision was made approximately 1 cm proximal to the radial styloid.  The first dorsal compartment was identified and incised longitudinally in line with the fibers of the extensor pollicis brevis.  Complete release was achieved, and then I incised across the septum getting into the compartment for the abductor pollicis longus.  I did not find a separate compartment for any additional slips, and had complete release of both tendons.  There was a sizable septum, which I  debated about fully excising, although the tendons were now fully free, and so I did not excise the septum.  The wounds were irrigated copiously, the tendons had smooth range of motion, and the subcutaneous tissue closed with nylon and the wounds injected in sterile gauze and a thumb spica splint was applied.  The tourniquet was released, and he returned to the PACU in stable and satisfactory condition.  There were no complications and he tolerated the procedure well.

## 2024-08-07 ENCOUNTER — Encounter (HOSPITAL_BASED_OUTPATIENT_CLINIC_OR_DEPARTMENT_OTHER): Payer: Self-pay | Admitting: Orthopedic Surgery

## 2024-08-14 ENCOUNTER — Ambulatory Visit (INDEPENDENT_AMBULATORY_CARE_PROVIDER_SITE_OTHER): Admitting: Adult Health

## 2024-08-14 ENCOUNTER — Encounter (INDEPENDENT_AMBULATORY_CARE_PROVIDER_SITE_OTHER): Payer: Self-pay | Admitting: Adult Health

## 2024-08-14 VITALS — BP 120/78 | HR 59 | Temp 98.6°F | Ht 73.0 in | Wt 248.0 lb

## 2024-08-14 DIAGNOSIS — E785 Hyperlipidemia, unspecified: Secondary | ICD-10-CM | POA: Diagnosis not present

## 2024-08-14 DIAGNOSIS — E559 Vitamin D deficiency, unspecified: Secondary | ICD-10-CM

## 2024-08-14 DIAGNOSIS — Z6832 Body mass index (BMI) 32.0-32.9, adult: Secondary | ICD-10-CM

## 2024-08-14 DIAGNOSIS — R7989 Other specified abnormal findings of blood chemistry: Secondary | ICD-10-CM

## 2024-08-14 DIAGNOSIS — E669 Obesity, unspecified: Secondary | ICD-10-CM

## 2024-08-14 DIAGNOSIS — M6790 Unspecified disorder of synovium and tendon, unspecified site: Secondary | ICD-10-CM | POA: Diagnosis not present

## 2024-08-14 DIAGNOSIS — I10 Essential (primary) hypertension: Secondary | ICD-10-CM

## 2024-08-14 DIAGNOSIS — E782 Mixed hyperlipidemia: Secondary | ICD-10-CM

## 2024-08-14 MED ORDER — ZEPBOUND 5 MG/0.5ML ~~LOC~~ SOLN: 5.0000 mg | SUBCUTANEOUS | 1 refills | Status: DC

## 2024-08-14 NOTE — Progress Notes (Signed)
 WEIGHT SUMMARY AND BIOMETRICS  Vitals Temp: 98.6 F (37 C) BP: 120/78 Pulse Rate: (!) 59 SpO2: 99 %   Anthropometric Measurements Height: 6' 1 (1.854 m) Weight: 248 lb (112.5 kg) BMI (Calculated): 32.73 Weight at Last Visit: 255 lb Weight Lost Since Last Visit: 7 lb Weight Gained Since Last Visit: 0 Starting Weight: 295 lb Total Weight Loss (lbs): 47 lb (21.3 kg)   Body Composition  Body Fat %: 31.6 % Fat Mass (lbs): 78.4 lbs Muscle Mass (lbs): 161.6 lbs Total Body Water (lbs): 118.2 lbs Visceral Fat Rating : 18   Other Clinical Data Fasting: yes Labs: no Today's Visit #: 44 Starting Date: 12/06/19    Chief Complaint:   OBESITY Edward Mccormick is here to discuss his progress with his obesity treatment plan.  He is on the practicing portion control and making smarter food choices, such as increasing vegetables and decreasing simple carbohydrates and states he is following his eating plan approximately 100 % of the time.  He states he is exercising: NEAT Activities  Interim History:  05/01/2024 started on weekly Zepbound  2.5mg  on/about  07/17/2024 Zepbound  2.5mg  increased to 5 mg Denies mass in neck, dysphagia, dyspepsia, persistent hoarseness, abdominal pain, or N/V/C   Reviewed Bioimpedance results with pt: Muscle Mass: -5.4 lbs Adipose Mass: -1.6 lbs  Subjective:   1. Tendon sheath disorder 08/06/2024 Chief Complaint: left Edward Mccormick  HPI: Edward Mccormick is a 63 y.o. male here for left wrist pain. Previous he had been doing well after an injection but symptoms have returned. He has restarted wearing his brace. Symptoms are rated as moderate to severe, and have been worsening.  This is significantly impairing activities of daily living.  He has elected for surgical management.  Assessment: Left de Quervain's.   Plan: Plan for Procedure(s): INCISION, TENDON SHEATH The risks benefits and alternatives were discussed with the patient including but not  limited to the risks of nonoperative treatment, versus surgical intervention including infection, bleeding, nerve injury,  blood clots, cardiopulmonary complications, morbidity, mortality, among others, and they were willing to proceed.   2. Hyperlipidemia/prediabetes Discussed Labs 05/01/2024 started on weekly Zepbound  2.5mg  on/about  07/17/2024 Zepbound  2.5mg  increased to 5 mg Lipid Panel     Component Value Date/Time   CHOL 148 07/17/2024 0945   TRIG 53 07/17/2024 0945   HDL 52 07/17/2024 0945   CHOLHDL 2.8 07/17/2024 0945   CHOLHDL 4 09/11/2019 1001   VLDL 16.0 09/11/2019 1001   LDLCALC 85 07/17/2024 0945   LDLDIRECT 145.5 10/14/2011 0822   LABVLDL 11 07/17/2024 0945     Latest Reference Range & Units 07/17/24 09:45  Glucose 70 - 99 mg/dL 88  Hemoglobin J8R 4.8 - 5.6 % 5.5  Est. average glucose Bld gHb Est-mCnc mg/dL 888  INSULIN  2.6 - 24.9 uIU/mL 14.9   Lipid panel stable He is on Crestor  10mg - Mon/Wed/Fri and weekly Zepbound  5mg   CBG, A1c at goal Insulin  level improved, however still above goal of 5 05/01/2024 started on weekly Zepbound  2.5mg  on/about  07/17/2024 Zepbound  2.5mg  increased to 5 mg Denies mass in neck, dysphagia, dyspepsia, persistent hoarseness, abdominal pain, or N/V/C   3. Elevated serum creatinine Discussed Labs 05/01/2024 started on weekly Zepbound  2.5mg  on/about  07/17/2024 Zepbound  2.5mg  increased to 5 mg  07/17/2024 CMP: Electrolytes, Liver Enzymes- normal Creat slightly improved GFR stable He has remained off supplementation Creatine  4. Vitamin D  deficiency Discussed Labs  Latest Reference Range & Units 07/17/24 09:45  Vitamin  D, 25-Hydroxy 30.0 - 100.0 ng/mL 93.3   Vit D level high normal He is on daily Vit D3 5000 daily  5. Essential hypertension Discussed Labs 07/17/2024 CMP: Electrolytes, Liver Enzymes- normal Creat slightly improved GFR stable He has remained off supplementation Creatine  05/01/2024 started on weekly Zepbound  2.5mg   on/about  07/17/2024 Zepbound  2.5mg  increased to 5 mg  Assessment/Plan:   1. Tendon sheath disorder Increase daily protein intake and f/u with Orthopedic Surgeon  2. Hyperlipidemia/prediabetes (Primary) Continue healthy eating and regular exercise Refill  tirzepatide  (ZEPBOUND ) 5 MG/0.5ML injection vial Inject 5 mg into the skin once a week. Dispense: 2 mL, Refills: 1 ordered   3. Elevated serum creatinine Avoid all Nephrotoxic substances Refill  tirzepatide  (ZEPBOUND ) 5 MG/0.5ML injection vial Inject 5 mg into the skin once a week. Dispense: 2 mL, Refills: 1 ordered   4. Vitamin D  deficiency Reduce OTC VIt D3 5000 from daily to three times weekly  5. Essential hypertension Continue healthy eating and regular exercise  6. Obesity, current BMI 32.8 Refill  tirzepatide  (ZEPBOUND ) 5 MG/0.5ML injection vial Inject 5 mg into the skin once a week. Dispense: 2 mL, Refills: 1 ordered   Edward Mccormick is currently in the action stage of change. As such, his goal is to continue with weight loss efforts. He has agreed to practicing portion control and making smarter food choices, such as increasing vegetables and decreasing simple carbohydrates.   Exercise goals: All adults should avoid inactivity. Some physical activity is better than none, and adults who participate in any amount of physical activity gain some health benefits. Adults should also include muscle-strengthening activities that involve all major muscle groups on 2 or more days a week.  Behavioral modification strategies: increasing lean protein intake, decreasing simple carbohydrates, increasing vegetables, increasing water intake, meal planning and cooking strategies, keeping healthy foods in the home, ways to avoid boredom eating, ways to avoid night time snacking, and planning for success.  Edward Mccormick has agreed to follow-up with our clinic in 4 weeks. He was informed of the importance of frequent follow-up visits to maximize his success  with intensive lifestyle modifications for his multiple health conditions.   Objective:   Blood pressure 120/78, pulse (!) 59, temperature 98.6 F (37 C), height 6' 1 (1.854 m), weight 248 lb (112.5 kg), SpO2 99%. Body mass index is 32.72 kg/m.  General: Cooperative, alert, well developed, in no acute distress. HEENT: Conjunctivae and lids unremarkable. Cardiovascular: Regular rhythm.  Lungs: Normal work of breathing. Neurologic: No focal deficits.   Lab Results  Component Value Date   CREATININE 1.27 07/17/2024   BUN 11 07/17/2024   NA 139 07/17/2024   K 4.5 07/17/2024   CL 104 07/17/2024   CO2 24 07/17/2024   Lab Results  Component Value Date   ALT 19 07/17/2024   AST 39 07/17/2024   ALKPHOS 44 07/17/2024   BILITOT 0.6 07/17/2024   Lab Results  Component Value Date   HGBA1C 5.5 07/17/2024   HGBA1C 5.8 (H) 12/06/2023   HGBA1C 5.6 02/08/2023   HGBA1C 5.6 08/31/2022   HGBA1C 5.9 (H) 03/16/2022   Lab Results  Component Value Date   INSULIN  14.9 07/17/2024   INSULIN  16.7 12/06/2023   INSULIN  29.8 (H) 02/08/2023   INSULIN  15.7 08/31/2022   INSULIN  15.7 03/16/2022   Lab Results  Component Value Date   TSH 2.400 07/17/2024   Lab Results  Component Value Date   CHOL 148 07/17/2024   HDL 52 07/17/2024  LDLCALC 85 07/17/2024   LDLDIRECT 145.5 10/14/2011   TRIG 53 07/17/2024   CHOLHDL 2.8 07/17/2024   Lab Results  Component Value Date   VD25OH 93.3 07/17/2024   VD25OH 75.2 12/06/2023   VD25OH 53.4 02/08/2023   Lab Results  Component Value Date   WBC 5.4 11/24/2021   HGB 12.8 (L) 11/24/2021   HCT 37.0 (L) 11/24/2021   MCV 90 11/24/2021   PLT 168 11/24/2021   No results found for: IRON, TIBC, FERRITIN  Attestation Statements:   Reviewed by clinician on day of visit: allergies, medications, problem list, medical history, surgical history, family history, social history, and previous encounter notes.  I have reviewed the above documentation  for accuracy and completeness, and I agree with the above. -  Marvelene Stoneberg d. Philena Obey, NP-C

## 2024-09-18 ENCOUNTER — Ambulatory Visit (INDEPENDENT_AMBULATORY_CARE_PROVIDER_SITE_OTHER): Admitting: Adult Health

## 2024-09-19 ENCOUNTER — Encounter (INDEPENDENT_AMBULATORY_CARE_PROVIDER_SITE_OTHER): Payer: Self-pay | Admitting: Adult Health

## 2024-09-19 ENCOUNTER — Ambulatory Visit (INDEPENDENT_AMBULATORY_CARE_PROVIDER_SITE_OTHER): Admitting: Adult Health

## 2024-09-19 VITALS — BP 99/60 | HR 81 | Temp 98.6°F | Ht 73.0 in | Wt 238.0 lb

## 2024-09-19 DIAGNOSIS — Z6833 Body mass index (BMI) 33.0-33.9, adult: Secondary | ICD-10-CM

## 2024-09-19 DIAGNOSIS — E559 Vitamin D deficiency, unspecified: Secondary | ICD-10-CM | POA: Diagnosis not present

## 2024-09-19 DIAGNOSIS — E785 Hyperlipidemia, unspecified: Secondary | ICD-10-CM

## 2024-09-19 DIAGNOSIS — I10 Essential (primary) hypertension: Secondary | ICD-10-CM

## 2024-09-19 DIAGNOSIS — R7989 Other specified abnormal findings of blood chemistry: Secondary | ICD-10-CM

## 2024-09-19 DIAGNOSIS — R7303 Prediabetes: Secondary | ICD-10-CM | POA: Diagnosis not present

## 2024-09-19 DIAGNOSIS — E782 Mixed hyperlipidemia: Secondary | ICD-10-CM

## 2024-09-19 DIAGNOSIS — Z6836 Body mass index (BMI) 36.0-36.9, adult: Secondary | ICD-10-CM

## 2024-09-19 DIAGNOSIS — E669 Obesity, unspecified: Secondary | ICD-10-CM

## 2024-09-19 MED ORDER — LOSARTAN POTASSIUM 50 MG PO TABS: 50.0000 mg | ORAL_TABLET | Freq: Every day | ORAL | 0 refills | Status: DC

## 2024-09-19 MED ORDER — ZEPBOUND 5 MG/0.5ML ~~LOC~~ SOLN: 5.0000 mg | SUBCUTANEOUS | 1 refills | Status: DC

## 2024-09-19 MED ORDER — ROSUVASTATIN CALCIUM 10 MG PO TABS: 10.0000 mg | ORAL_TABLET | Freq: Every day | ORAL | 0 refills | Status: AC

## 2024-09-19 NOTE — Progress Notes (Signed)
 WEIGHT SUMMARY AND BIOMETRICS  Vitals Temp: 98.6 F (37 C) BP: 99/60 Pulse Rate: 81 SpO2: 99 %   Anthropometric Measurements Height: 6' 1 (1.854 m) Weight: 238 lb (108 kg) BMI (Calculated): 31.41 Weight at Last Visit: 248lb Weight Lost Since Last Visit: 10lb Weight Gained Since Last Visit: 0lb Starting Weight: 295lb Total Weight Loss (lbs): 57 lb (25.9 kg)   Body Composition  Body Fat %: 29 % Fat Mass (lbs): 69.2 lbs Muscle Mass (lbs): 160.8 lbs Total Body Water (lbs): 113.8 lbs Visceral Fat Rating : 16   Other Clinical Data Fasting: Yes Labs: No Today's Visit #: 45 Starting Date: 12/06/19    Chief Complaint:   OBESITY Edward Mccormick is here to discuss his progress with his obesity treatment plan.  He is on the practicing portion control and making smarter food choices, such as increasing vegetables and decreasing simple carbohydrates and states he is following his eating plan approximately 100 % of the time.  He states he is exercising Calisthenics 30 minutes 5 times per week.  Interim History:  05/01/2024 started on weekly Zepbound  2.5mg  on/about  07/17/2024 Zepbound  2.5mg  increased to 5 mg Hunger/appetite-stable appetite He has increased duration, intensity, and frequency of regular exercise (Calisthenics)  Reviewed Bioimpedance Results with pt: Muscle Mass: -0.8 lb Adipose Mass: -9.2 lbs  Subjective:   1. Vitamin D  deficiency  Latest Reference Range & Units 07/17/24 09:45  Vitamin D , 25-Hydroxy 30.0 - 100.0 ng/mL 93.3   Vit D Level high normal He denies N/V/Muscle Weakness He is on oral OTC supplementation- unsure of dosage  2. Hyperlipidemia/prediabetes He is on evening Crestor  10mg - three times weekly He denies myalgias  3. Elevated serum creatinine BP soft, yet stable He denies sx's of hypotension He is on daily Losartan  50mg  He has not been checking home BP, however has a reliable cuff  4. Essential hypertension BP soft, yet stable He  denies sx's of hypotension He is on daily Losartan  50mg  He has not been checking home BP, however has a reliable cuff  5. Prediabetes 05/01/2024 started on weekly Zepbound  2.5mg  on/about  07/17/2024 Zepbound  2.5mg  increased to 5 mg Hunger/appetite-stable appetite Denies mass in neck, dysphagia, dyspepsia, persistent hoarseness, abdominal pain, or N/V/C   Assessment/Plan:   1. Vitamin D  deficiency Monitor Labs Confirm dosage at next OV  2. Hyperlipidemia/prediabetes (Primary) Refill - rosuvastatin  (CRESTOR ) 10 MG tablet; Take 1 tablet (10 mg total) by mouth daily. (M-W-F)  Dispense: 90 tablet; Refill: 0  3. Elevated serum creatinine Refill - losartan  (COZAAR ) 50 MG tablet; Take 1 tablet (50 mg total) by mouth daily.  Dispense: 90 tablet; Refill: 0  4. Essential hypertension Refill - losartan  (COZAAR ) 50 MG tablet; Take 1 tablet (50 mg total) by mouth daily.  Dispense: 90 tablet; Refill: 0 Remain well hydrated with water Monitor home BP If dizziness or consistent SBP <100- CONTACT [facility] Pt verablized understanding and agreement  5. Prediabetes Continue healthy eating and regular exercise  6. Obesity, current BMI 33.7 Refill   tirzepatide  (ZEPBOUND ) 5 MG/0.5ML injection vial Inject 5 mg into the skin once a week. Dispense: 2 mL, Refills: 1 ordered   Edward Mccormick is currently in the action stage of change. As such, his goal is to continue with weight loss efforts. He has agreed to practicing portion control and making smarter food choices, such as increasing vegetables and decreasing simple carbohydrates.   Exercise goals: For substantial health benefits, adults should do at least 150 minutes (2 hours and  30 minutes) a week of moderate-intensity, or 75 minutes (1 hour and 15 minutes) a week of vigorous-intensity aerobic physical activity, or an equivalent combination of moderate- and vigorous-intensity aerobic activity. Aerobic activity should be performed in episodes of at least 10  minutes, and preferably, it should be spread throughout the week.  Behavioral modification strategies: increasing lean protein intake, decreasing simple carbohydrates, increasing vegetables, increasing water intake, no skipping meals, meal planning and cooking strategies, keeping healthy foods in the home, ways to avoid boredom eating, and planning for success.  Gyasi has agreed to follow-up with our clinic in 4 weeks. He was informed of the importance of frequent follow-up visits to maximize his success with intensive lifestyle modifications for his multiple health conditions.   Objective:   Blood pressure 99/60, pulse 81, temperature 98.6 F (37 C), height 6' 1 (1.854 m), weight 238 lb (108 kg), SpO2 99%. Body mass index is 31.4 kg/m.  General: Cooperative, alert, well developed, in no acute distress. HEENT: Conjunctivae and lids unremarkable. Cardiovascular: Regular rhythm.  Lungs: Normal work of breathing. Neurologic: No focal deficits.   Lab Results  Component Value Date   CREATININE 1.27 07/17/2024   BUN 11 07/17/2024   NA 139 07/17/2024   K 4.5 07/17/2024   CL 104 07/17/2024   CO2 24 07/17/2024   Lab Results  Component Value Date   ALT 19 07/17/2024   AST 39 07/17/2024   ALKPHOS 44 07/17/2024   BILITOT 0.6 07/17/2024   Lab Results  Component Value Date   HGBA1C 5.5 07/17/2024   HGBA1C 5.8 (H) 12/06/2023   HGBA1C 5.6 02/08/2023   HGBA1C 5.6 08/31/2022   HGBA1C 5.9 (H) 03/16/2022   Lab Results  Component Value Date   INSULIN  14.9 07/17/2024   INSULIN  16.7 12/06/2023   INSULIN  29.8 (H) 02/08/2023   INSULIN  15.7 08/31/2022   INSULIN  15.7 03/16/2022   Lab Results  Component Value Date   TSH 2.400 07/17/2024   Lab Results  Component Value Date   CHOL 148 07/17/2024   HDL 52 07/17/2024   LDLCALC 85 07/17/2024   LDLDIRECT 145.5 10/14/2011   TRIG 53 07/17/2024   CHOLHDL 2.8 07/17/2024   Lab Results  Component Value Date   VD25OH 93.3 07/17/2024    VD25OH 75.2 12/06/2023   VD25OH 53.4 02/08/2023   Lab Results  Component Value Date   WBC 5.4 11/24/2021   HGB 12.8 (L) 11/24/2021   HCT 37.0 (L) 11/24/2021   MCV 90 11/24/2021   PLT 168 11/24/2021   No results found for: IRON, TIBC, FERRITIN  Attestation Statements:   Reviewed by clinician on day of visit: allergies, medications, problem list, medical history, surgical history, family history, social history, and previous encounter notes.  I have reviewed the above documentation for accuracy and completeness, and I agree with the above. - Wilkins Elpers d. Melanny Wire, NP-C

## 2024-10-23 ENCOUNTER — Encounter (INDEPENDENT_AMBULATORY_CARE_PROVIDER_SITE_OTHER): Payer: Self-pay | Admitting: Adult Health

## 2024-10-23 ENCOUNTER — Ambulatory Visit (INDEPENDENT_AMBULATORY_CARE_PROVIDER_SITE_OTHER): Payer: Self-pay | Admitting: Adult Health

## 2024-10-23 VITALS — BP 125/89 | HR 81 | Temp 98.6°F | Ht 73.0 in | Wt 233.0 lb

## 2024-10-23 DIAGNOSIS — I10 Essential (primary) hypertension: Secondary | ICD-10-CM

## 2024-10-23 DIAGNOSIS — E66812 Obesity, class 2: Secondary | ICD-10-CM

## 2024-10-23 DIAGNOSIS — R7303 Prediabetes: Secondary | ICD-10-CM

## 2024-10-23 DIAGNOSIS — E782 Mixed hyperlipidemia: Secondary | ICD-10-CM

## 2024-10-23 DIAGNOSIS — E559 Vitamin D deficiency, unspecified: Secondary | ICD-10-CM

## 2024-10-23 DIAGNOSIS — E785 Hyperlipidemia, unspecified: Secondary | ICD-10-CM | POA: Diagnosis not present

## 2024-10-23 DIAGNOSIS — Z6830 Body mass index (BMI) 30.0-30.9, adult: Secondary | ICD-10-CM

## 2024-10-23 DIAGNOSIS — E669 Obesity, unspecified: Secondary | ICD-10-CM

## 2024-10-23 MED ORDER — ZEPBOUND 10 MG/0.5ML ~~LOC~~ SOLN: 10.0000 mg | SUBCUTANEOUS | 0 refills | Status: DC

## 2024-10-23 NOTE — Progress Notes (Signed)
 WEIGHT SUMMARY AND BIOMETRICS  Vitals Temp: 98.6 F (37 C) BP: 125/89 Pulse Rate: 81 SpO2: 98 %   Anthropometric Measurements Height: 6' 1 (1.854 m) Weight: 233 lb (105.7 kg) BMI (Calculated): 30.75 Weight at Last Visit: 238lb Weight Lost Since Last Visit: 5lb Weight Gained Since Last Visit: 0lb Starting Weight: 295lb Total Weight Loss (lbs): 62 lb (28.1 kg)   Body Composition  Body Fat %: 29.2 % Fat Mass (lbs): 68.2 lbs Muscle Mass (lbs): 157.2 lbs Total Body Water (lbs): 111.4 lbs Visceral Fat Rating : 16   Other Clinical Data Fasting: yes Labs: No Today's Visit #: 47 Starting Date: 12/06/19    Chief Complaint:   OBESITY Edward Mccormick is here to discuss his progress with his obesity treatment plan.  He is on the practicing portion control and making smarter food choices, such as increasing vegetables and decreasing simple carbohydrates and states he is following his eating plan approximately 95 % of the time.  He states he is exercising Walking 30 minutes 2 times per week.  Interim History:  05/01/2024 started on weekly Zepbound  2.5mg  on/about 07/17/2024 Zepbound  2.5mg  increased to 5 mg  Denies mass in neck, dysphagia, dyspepsia, persistent hoarseness, abdominal pain, or N/V/C   He and his family travelled to [place] for 10 day trip.  He stopped daily Losartan  50mg  approximately 4 weeks ago.  Reviewed home readings: Home Readings: SBP: 90-120s DBP: 70-80s  Subjective:   1.  HTN He stopped daily Losartan  50mg  > 4 weeks ago Home Readings: SBP: 90-120s DBP: 70-80s He denies cardiac sx's BP at goal at OV today  2. Vitamin D  deficiency  Latest Reference Range & Units 02/08/23 08:40 12/06/23 10:23 07/17/24 09:45  Vitamin D , 25-Hydroxy 30.0 - 100.0 ng/mL 53.4 75.2 93.3   Vit D Level stable  3. Hyperlipidemia/prediabetes He has been consistently taking Crestor  10 mg on Monday/Wednesday/Friday  Assessment/Plan:   1. HTN Remain off Losartan - MAR  updated Monitor home BP Contact [facility] if SBP consistently >140  2. Vitamin D  deficiency (Primary) Monitor Labs  3. Hyperlipidemia/prediabetes Refill tirzepatide  (ZEPBOUND ) 10 MG/0.5ML injection vial Inject 10 mg into the skin once a week. Dispense: 2 mL, Refills: 0 ordered  Take 1/2 vial once weekly with aseptic technique   4. Obesity, current BMI 30.8 Refill tirzepatide  (ZEPBOUND ) 10 MG/0.5ML injection vial Inject 10 mg into the skin once a week. Dispense: 2 mL, Refills: 0 ordered  Take 1/2 vial once weekly with aseptic technique   Edward Mccormick is currently in the action stage of change. As such, his goal is to continue with weight loss efforts. He has agreed to practicing portion control and making smarter food choices, such as increasing vegetables and decreasing simple carbohydrates.   Exercise goals: For substantial health benefits, adults should do at least 150 minutes (2 hours and 30 minutes) a week of moderate-intensity, or 75 minutes (1 hour and 15 minutes) a week of vigorous-intensity aerobic physical activity, or an equivalent combination of moderate- and vigorous-intensity aerobic activity. Aerobic activity should be performed in episodes of at least 10 minutes, and preferably, it should be spread throughout the week.  Behavioral modification strategies: increasing lean protein intake, decreasing simple carbohydrates, increasing vegetables, increasing water intake, no skipping meals, meal planning and cooking strategies, keeping healthy foods in the home, and planning for success.  Edward Mccormick has agreed to follow-up with our clinic in 4 weeks. He was informed of the importance of frequent follow-up visits to maximize his success with intensive  lifestyle modifications for his multiple health conditions.   Objective:   Blood pressure 125/89, pulse 81, temperature 98.6 F (37 C), height 6' 1 (1.854 m), weight 233 lb (105.7 kg), SpO2 98%. Body mass index is 30.74 kg/m.  General:  Cooperative, alert, well developed, in no acute distress. HEENT: Conjunctivae and lids unremarkable. Cardiovascular: Regular rhythm.  Lungs: Normal work of breathing. Neurologic: No focal deficits.   Lab Results  Component Value Date   CREATININE 1.27 07/17/2024   BUN 11 07/17/2024   NA 139 07/17/2024   K 4.5 07/17/2024   CL 104 07/17/2024   CO2 24 07/17/2024   Lab Results  Component Value Date   ALT 19 07/17/2024   AST 39 07/17/2024   ALKPHOS 44 07/17/2024   BILITOT 0.6 07/17/2024   Lab Results  Component Value Date   HGBA1C 5.5 07/17/2024   HGBA1C 5.8 (H) 12/06/2023   HGBA1C 5.6 02/08/2023   HGBA1C 5.6 08/31/2022   HGBA1C 5.9 (H) 03/16/2022   Lab Results  Component Value Date   INSULIN  14.9 07/17/2024   INSULIN  16.7 12/06/2023   INSULIN  29.8 (H) 02/08/2023   INSULIN  15.7 08/31/2022   INSULIN  15.7 03/16/2022   Lab Results  Component Value Date   TSH 2.400 07/17/2024   Lab Results  Component Value Date   CHOL 148 07/17/2024   HDL 52 07/17/2024   LDLCALC 85 07/17/2024   LDLDIRECT 145.5 10/14/2011   TRIG 53 07/17/2024   CHOLHDL 2.8 07/17/2024   Lab Results  Component Value Date   VD25OH 93.3 07/17/2024   VD25OH 75.2 12/06/2023   VD25OH 53.4 02/08/2023   Lab Results  Component Value Date   WBC 5.4 11/24/2021   HGB 12.8 (L) 11/24/2021   HCT 37.0 (L) 11/24/2021   MCV 90 11/24/2021   PLT 168 11/24/2021   No results found for: IRON, TIBC, FERRITIN  Attestation Statements:   Reviewed by clinician on day of visit: allergies, medications, problem list, medical history, surgical history, family history, social history, and previous encounter notes.  I have reviewed the above documentation for accuracy and completeness, and I agree with the above. -  Quanah Majka d. Sarrah Fiorenza, NP-C

## 2024-11-12 ENCOUNTER — Other Ambulatory Visit (INDEPENDENT_AMBULATORY_CARE_PROVIDER_SITE_OTHER): Payer: Self-pay | Admitting: Adult Health

## 2024-11-27 ENCOUNTER — Ambulatory Visit (INDEPENDENT_AMBULATORY_CARE_PROVIDER_SITE_OTHER): Admitting: Adult Health

## 2024-11-27 VITALS — BP 105/75 | HR 83 | Temp 98.2°F | Ht 73.0 in | Wt 224.0 lb

## 2024-11-27 DIAGNOSIS — R7303 Prediabetes: Secondary | ICD-10-CM | POA: Diagnosis not present

## 2024-11-27 DIAGNOSIS — E669 Obesity, unspecified: Secondary | ICD-10-CM | POA: Diagnosis not present

## 2024-11-27 DIAGNOSIS — E559 Vitamin D deficiency, unspecified: Secondary | ICD-10-CM | POA: Diagnosis not present

## 2024-11-27 DIAGNOSIS — I10 Essential (primary) hypertension: Secondary | ICD-10-CM

## 2024-11-27 DIAGNOSIS — R7989 Other specified abnormal findings of blood chemistry: Secondary | ICD-10-CM | POA: Diagnosis not present

## 2024-11-27 DIAGNOSIS — Z6829 Body mass index (BMI) 29.0-29.9, adult: Secondary | ICD-10-CM | POA: Diagnosis not present

## 2024-11-27 DIAGNOSIS — E66812 Obesity, class 2: Secondary | ICD-10-CM

## 2024-11-27 MED ORDER — ZEPBOUND 10 MG/0.5ML ~~LOC~~ SOLN: 10.0000 mg | SUBCUTANEOUS | 0 refills | Status: AC

## 2024-11-27 NOTE — Progress Notes (Addendum)
 "    WEIGHT SUMMARY AND BIOMETRICS  Vitals Temp: 98.2 F (36.8 C) BP: 105/75 Pulse Rate: 83 SpO2: 94 %   Anthropometric Measurements Height: 6' 1 (1.854 m) Weight: 224 lb (101.6 kg) BMI (Calculated): 29.56 Weight at Last Visit: 233 lb Weight Lost Since Last Visit: 9 lb Weight Gained Since Last Visit: 0 Starting Weight: 295 lb Total Weight Loss (lbs): 71 lb (32.2 kg)   Body Composition  Body Fat %: 29.1 % Fat Mass (lbs): 65.2 lbs Muscle Mass (lbs): 151 lbs Total Body Water (lbs): 109.8 lbs Visceral Fat Rating : 16   Other Clinical Data Fasting: yes Labs: no Today's Visit #: 47 Starting Date: 12/06/19    Chief Complaint:   OBESITY Edward Mccormick is here to discuss his progress with his obesity treatment plan.  He is on the practicing portion control and making smarter food choices, such as increasing vegetables and decreasing simple carbohydrates and states he is following his eating plan approximately 100 % of the time.  He states he is exercising Cardiovascular Exercise and Strength Training 30/30 minutes 3/3 times per week.  Interim History:  05/01/2024 started on weekly Zepbound  2.5mg  on/about  07/17/2024 Zepbound  2.5mg  increased to 5 mg 10/23/2024 Zepbound  10mg - instructions to take 1/2 vial to equal 5mg  once weekly- administers on Monday  He has remained off Losartan  50mg  for last 8 weeks- BP remains well controlled with lifestyle  He has lost 80 lbs Current weight 224 lbs with corresponding BMI 29  Ultimate Goal Weight: 210-215 lbs  Subjective:   1. Essential hypertension He has remained off Losartan  50mg  for last 8 weeks- BP remains well controlled with lifestyle BP today 105/75 HR 83  2. Vitamin D  deficiency He endorses stable energy levels   Latest Reference Range & Units 02/08/23 08:40 12/06/23 10:23 07/17/24 09:45  Vitamin D , 25-Hydroxy 30.0 - 100.0 ng/mL 53.4 75.2 93.3   3. Prediabetes 05/01/2024 started on weekly Zepbound  2.5mg  on/about   07/17/2024 Zepbound  2.5mg  increased to 5 mg 10/23/2024 Zepbound  10mg - instructions to take 1/2 vial to equal 5mg  once weekly- administers on Monday Denies mass in neck, dysphagia, dyspepsia, persistent hoarseness, abdominal pain, or N/V/C   4. Elevated serum creatinine He has remained off Losartan  50mg  for last 8 weeks- BP remains well controlled with lifestyle BP today 105/75 HR 83   Latest Reference Range & Units 02/08/23 08:40 04/05/23 09:49 07/17/24 09:45  Creatinine 0.76 - 1.27 mg/dL 8.70 (H) 8.71 (H) 8.72  (H): Data is abnormally high  Assessment/Plan:   1. Essential hypertension (Primary) Continue healthy eating and regular exercise Remain off ARB Monitor home BP Contact [facility] if home readings consistently > 130/80  2. Vitamin D  deficiency Monitor Labs  3. Prediabetes Refill tirzepatide  (ZEPBOUND ) 10 MG/0.5ML injection vial Inject 10 mg into the skin once a week. Dispense: 2 mL, Refills: 0 ordered  Take 1/2 vial once weekly with aseptic technique   4. Elevated serum creatinine Keep BP well controlled Monitor Labs Avoid Nephrotoxic Substances  5. Obesity, current BMI 29.6 Refill tirzepatide  (ZEPBOUND ) 10 MG/0.5ML injection vial Inject 10 mg into the skin once a week. Dispense: 2 mL, Refills: 0 ordered  Take 1/2 vial once weekly with aseptic technique   Patient was counseled on the importance of maintaining healthy lifestyle habits, including balanced nutrition, regular physical activity, and behavioral modifications, while taking antiobesity medication.   Patient verbalized understanding that medication is an adjunct to, not a replacement for, lifestyle changes and that the long-term success and weight  maintenance depend on continued adherence to these strategies.   [name] is currently in the action stage of change. As such, his goal is to continue with weight loss efforts. He has agreed to practicing portion control and making smarter food choices, such as increasing  vegetables and decreasing simple carbohydrates.   Exercise goals: For substantial health benefits, adults should do at least 150 minutes (2 hours and 30 minutes) a week of moderate-intensity, or 75 minutes (1 hour and 15 minutes) a week of vigorous-intensity aerobic physical activity, or an equivalent combination of moderate- and vigorous-intensity aerobic activity. Aerobic activity should be performed in episodes of at least 10 minutes, and preferably, it should be spread throughout the week.  Behavioral modification strategies: increasing lean protein intake, decreasing simple carbohydrates, increasing vegetables, increasing water intake, no skipping meals, meal planning and cooking strategies, keeping healthy foods in the home, ways to avoid boredom eating, and planning for success.  [name] has agreed to follow-up with our clinic in 4 weeks. He was informed of the importance of frequent follow-up visits to maximize his success with intensive lifestyle modifications for his multiple health conditions.   Complete Fasting Labs with PCP- bring results to [facility]  Objective:   Blood pressure 105/75, pulse 83, temperature 98.2 F (36.8 C), height 6' 1 (1.854 m), weight 224 lb (101.6 kg), SpO2 94%. Body mass index is 29.55 kg/m.  General: Cooperative, alert, well developed, in no acute distress. HEENT: Conjunctivae and lids unremarkable. Cardiovascular: Regular rhythm.  Lungs: Normal work of breathing. Neurologic: No focal deficits.   Lab Results  Component Value Date   CREATININE 1.27 07/17/2024   BUN 11 07/17/2024   NA 139 07/17/2024   K 4.5 07/17/2024   CL 104 07/17/2024   CO2 24 07/17/2024   Lab Results  Component Value Date   ALT 19 07/17/2024   AST 39 07/17/2024   ALKPHOS 44 07/17/2024   BILITOT 0.6 07/17/2024   Lab Results  Component Value Date   HGBA1C 5.5 07/17/2024   HGBA1C 5.8 (H) 12/06/2023   HGBA1C 5.6 02/08/2023   HGBA1C 5.6 08/31/2022   HGBA1C 5.9 (H) 03/16/2022    Lab Results  Component Value Date   INSULIN  14.9 07/17/2024   INSULIN  16.7 12/06/2023   INSULIN  29.8 (H) 02/08/2023   INSULIN  15.7 08/31/2022   INSULIN  15.7 03/16/2022   Lab Results  Component Value Date   TSH 2.400 07/17/2024   Lab Results  Component Value Date   CHOL 148 07/17/2024   HDL 52 07/17/2024   LDLCALC 85 07/17/2024   LDLDIRECT 145.5 10/14/2011   TRIG 53 07/17/2024   CHOLHDL 2.8 07/17/2024   Lab Results  Component Value Date   VD25OH 93.3 07/17/2024   VD25OH 75.2 12/06/2023   VD25OH 53.4 02/08/2023   Lab Results  Component Value Date   WBC 5.4 11/24/2021   HGB 12.8 (L) 11/24/2021   HCT 37.0 (L) 11/24/2021   MCV 90 11/24/2021   PLT 168 11/24/2021   No results found for: IRON, TIBC, FERRITIN  Attestation Statements:   Reviewed by clinician on day of visit: allergies, medications, problem list, medical history, surgical history, family history, social history, and previous encounter notes.  Time spent on visit including pre-visit chart review and post-visit care and charting was 26 minutes.   I have reviewed the above documentation for accuracy and completeness, and I agree with the above. -  Myha Arizpe d. Ilee Randleman, NP-C "

## 2024-12-05 ENCOUNTER — Other Ambulatory Visit (INDEPENDENT_AMBULATORY_CARE_PROVIDER_SITE_OTHER): Payer: Self-pay | Admitting: Adult Health

## 2024-12-27 ENCOUNTER — Other Ambulatory Visit (INDEPENDENT_AMBULATORY_CARE_PROVIDER_SITE_OTHER): Payer: Self-pay | Admitting: Adult Health

## 2024-12-27 DIAGNOSIS — Z6836 Body mass index (BMI) 36.0-36.9, adult: Secondary | ICD-10-CM

## 2024-12-27 DIAGNOSIS — R7303 Prediabetes: Secondary | ICD-10-CM

## 2025-01-08 ENCOUNTER — Ambulatory Visit (INDEPENDENT_AMBULATORY_CARE_PROVIDER_SITE_OTHER): Admitting: Adult Health

## 2025-01-15 ENCOUNTER — Ambulatory Visit (INDEPENDENT_AMBULATORY_CARE_PROVIDER_SITE_OTHER): Admitting: Adult Health

## 2025-03-05 ENCOUNTER — Ambulatory Visit (INDEPENDENT_AMBULATORY_CARE_PROVIDER_SITE_OTHER): Admitting: Internal Medicine

## 2025-03-05 ENCOUNTER — Ambulatory Visit (INDEPENDENT_AMBULATORY_CARE_PROVIDER_SITE_OTHER): Admitting: Adult Health

## 2025-03-05 ENCOUNTER — Ambulatory Visit (INDEPENDENT_AMBULATORY_CARE_PROVIDER_SITE_OTHER): Admitting: Physician Assistant
